# Patient Record
Sex: Male | Born: 1950
Health system: Southern US, Community
[De-identification: ages and names within clinical notes are randomized; demographics above are authoritative.]

## PROBLEM LIST (undated history)

## (undated) DIAGNOSIS — C801 Malignant (primary) neoplasm, unspecified: Secondary | ICD-10-CM

## (undated) DIAGNOSIS — H9193 Unspecified hearing loss, bilateral: Secondary | ICD-10-CM

## (undated) DIAGNOSIS — K635 Polyp of colon: Secondary | ICD-10-CM

## (undated) DIAGNOSIS — G4730 Sleep apnea, unspecified: Secondary | ICD-10-CM

## (undated) DIAGNOSIS — G43809 Other migraine, not intractable, without status migrainosus: Secondary | ICD-10-CM

## (undated) DIAGNOSIS — M1990 Unspecified osteoarthritis, unspecified site: Secondary | ICD-10-CM

## (undated) DIAGNOSIS — T7840XA Allergy, unspecified, initial encounter: Secondary | ICD-10-CM

## (undated) DIAGNOSIS — F32A Depression, unspecified: Secondary | ICD-10-CM

## (undated) DIAGNOSIS — I10 Essential (primary) hypertension: Secondary | ICD-10-CM

## (undated) DIAGNOSIS — F329 Major depressive disorder, single episode, unspecified: Secondary | ICD-10-CM

## (undated) DIAGNOSIS — G43909 Migraine, unspecified, not intractable, without status migrainosus: Secondary | ICD-10-CM

## (undated) DIAGNOSIS — R7989 Other specified abnormal findings of blood chemistry: Secondary | ICD-10-CM

## (undated) HISTORY — DX: Other specified abnormal findings of blood chemistry: R79.89

## (undated) HISTORY — PX: BICEPS TENDON REPAIR: SHX566

## (undated) HISTORY — DX: Sleep apnea, unspecified: G47.30

## (undated) HISTORY — PX: KNEE ARTHROSCOPY: SUR90

## (undated) HISTORY — DX: Polyp of colon: K63.5

## (undated) HISTORY — DX: Malignant (primary) neoplasm, unspecified: C80.1

## (undated) HISTORY — DX: Essential (primary) hypertension: I10

## (undated) HISTORY — DX: Migraine, unspecified, not intractable, without status migrainosus: G43.909

## (undated) HISTORY — DX: Other migraine, not intractable, without status migrainosus: G43.809

## (undated) HISTORY — PX: INNER EAR SURGERY: SHX679

## (undated) HISTORY — DX: Allergy, unspecified, initial encounter: T78.40XA

## (undated) HISTORY — DX: Depression, unspecified: F32.A

## (undated) HISTORY — DX: Unspecified hearing loss, bilateral: H91.93

## (undated) HISTORY — PX: WISDOM TOOTH EXTRACTION: SHX21

## (undated) HISTORY — DX: Unspecified osteoarthritis, unspecified site: M19.90

## (undated) HISTORY — PX: TYMPANOSTOMY TUBE PLACEMENT: SHX32

## (undated) HISTORY — PX: ROTATOR CUFF REPAIR: SHX139

---
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
## 2001-01-30 ENCOUNTER — Encounter: Payer: Self-pay | Admitting: Emergency Medicine

## 2001-01-30 ENCOUNTER — Emergency Department (HOSPITAL_COMMUNITY): Admission: EM | Admit: 2001-01-30 | Discharge: 2001-01-30 | Payer: Self-pay | Admitting: Emergency Medicine

## 2006-07-22 ENCOUNTER — Encounter: Payer: Self-pay | Admitting: Gastroenterology

## 2007-09-23 ENCOUNTER — Emergency Department (HOSPITAL_COMMUNITY): Admission: EM | Admit: 2007-09-23 | Discharge: 2007-09-23 | Payer: Self-pay | Admitting: Emergency Medicine

## 2008-02-26 ENCOUNTER — Ambulatory Visit: Payer: Self-pay | Admitting: Gastroenterology

## 2008-02-26 DIAGNOSIS — L290 Pruritus ani: Secondary | ICD-10-CM

## 2010-01-28 ENCOUNTER — Encounter: Admission: RE | Admit: 2010-01-28 | Discharge: 2010-01-28 | Payer: Self-pay | Admitting: Chiropractic Medicine

## 2010-12-07 ENCOUNTER — Other Ambulatory Visit: Payer: Self-pay | Admitting: Chiropractic Medicine

## 2010-12-07 DIAGNOSIS — M25512 Pain in left shoulder: Secondary | ICD-10-CM

## 2010-12-09 ENCOUNTER — Other Ambulatory Visit: Payer: Self-pay

## 2010-12-14 ENCOUNTER — Ambulatory Visit
Admission: RE | Admit: 2010-12-14 | Discharge: 2010-12-14 | Disposition: A | Discharge status: Home / Self Care | Payer: BC Managed Care – PPO | Source: Ambulatory Visit | Attending: Chiropractic Medicine | Admitting: Chiropractic Medicine

## 2010-12-14 DIAGNOSIS — M25512 Pain in left shoulder: Secondary | ICD-10-CM

## 2013-01-03 ENCOUNTER — Other Ambulatory Visit (HOSPITAL_COMMUNITY): Payer: Self-pay | Admitting: Cardiology

## 2013-01-03 ENCOUNTER — Telehealth: Payer: Self-pay | Admitting: General Surgery

## 2013-01-03 ENCOUNTER — Ambulatory Visit (HOSPITAL_COMMUNITY): Payer: BC Managed Care – PPO | Attending: Cardiology | Admitting: Radiology

## 2013-01-03 ENCOUNTER — Ambulatory Visit (HOSPITAL_BASED_OUTPATIENT_CLINIC_OR_DEPARTMENT_OTHER): Payer: BC Managed Care – PPO | Admitting: Cardiology

## 2013-01-03 ENCOUNTER — Telehealth: Payer: Self-pay | Admitting: Cardiology

## 2013-01-03 VITALS — BP 117/76 | HR 67 | Ht 71.0 in | Wt 268.0 lb

## 2013-01-03 DIAGNOSIS — R0989 Other specified symptoms and signs involving the circulatory and respiratory systems: Secondary | ICD-10-CM | POA: Insufficient documentation

## 2013-01-03 DIAGNOSIS — G809 Cerebral palsy, unspecified: Secondary | ICD-10-CM

## 2013-01-03 DIAGNOSIS — R0602 Shortness of breath: Secondary | ICD-10-CM

## 2013-01-03 DIAGNOSIS — R079 Chest pain, unspecified: Secondary | ICD-10-CM

## 2013-01-03 DIAGNOSIS — R0609 Other forms of dyspnea: Secondary | ICD-10-CM | POA: Insufficient documentation

## 2013-01-03 DIAGNOSIS — Z87891 Personal history of nicotine dependence: Secondary | ICD-10-CM | POA: Insufficient documentation

## 2013-01-03 DIAGNOSIS — I079 Rheumatic tricuspid valve disease, unspecified: Secondary | ICD-10-CM | POA: Insufficient documentation

## 2013-01-03 DIAGNOSIS — I77810 Thoracic aortic ectasia: Secondary | ICD-10-CM

## 2013-01-03 MED ORDER — TECHNETIUM TC 99M SESTAMIBI GENERIC - CARDIOLITE
30.0000 | Freq: Once | INTRAVENOUS | Status: AC | PRN
  Administered 2013-01-03: 30 via INTRAVENOUS

## 2013-01-03 MED ORDER — TECHNETIUM TC 99M SESTAMIBI GENERIC - CARDIOLITE
10.0000 | Freq: Once | INTRAVENOUS | Status: AC | PRN
  Administered 2013-01-03: 10 via INTRAVENOUS

## 2013-01-03 NOTE — Progress Notes (Signed)
LVM for pt to return call for ECHO results.

## 2013-01-03 NOTE — Progress Notes (Signed)
Dr. Mayford Knife, What DX code would you like me to put his ECHO under one year from now.

## 2013-01-03 NOTE — Progress Notes (Signed)
[facility] SITE 3 NUCLEAR MED [address] [place], [place] [address] [phone]    Cardiology Nuclear Med Study  Michael Conway is a 62 y.o. male     MRN : [id number]     DOB: [birth year]  Procedure Date: 01/03/2013  Nuclear Med Background Indication for Stress Test:  Evaluation for Ischemia History:  No prior known history of CAD, 15 yrs ago GXT: OK per patient Cardiac Risk Factors: History of Smoking  Symptoms:  DOE and SOB   Nuclear Pre-Procedure Caffeine/Decaff Intake:  None > 12 hrs NPO After: 6:00pm   Lungs:  clear O2 Sat: 98% on room air. IV 0.9% NS with Angio Cath:  20g  IV Site: R Antecubital x 1, tolerated well IV Started by:  Irean Hong, RN  Chest Size (in):  50 Cup Size: n/a  Height: 5\' 11"  (1.803 m)  Weight:  268 lb (121.564 kg)  BMI:  Body mass index is 37.39 kg/(m^2). Tech Comments:  n/a    Nuclear Med Study 1 or 2 day study: 1 day  Stress Test Type:  Stress  Reading MD: Armanda Magic, MD  Order Authorizing Provider:  Armanda Magic, MD  Resting Radionuclide: Technetium 21m Sestamibi  Resting Radionuclide Dose: 11.0 mCi   Stress Radionuclide:  Technetium 17m Sestamibi  Stress Radionuclide Dose: 33.0 mCi           Stress Protocol Rest HR: 67 Stress HR: 141  Rest BP: 117/76 Stress BP: 215/78  Exercise Time (min): 9:00 METS: 10   Predicted Max HR: 156 bpm % Max HR: 90.38 bpm Rate Pressure Product: 21308   Dose of Adenosine (mg):  n/a Dose of Lexiscan: n/a mg  Dose of Atropine (mg): n/a Dose of Dobutamine: n/a mcg/kg/min (at max HR)  Stress Test Technologist: Nelson Chimes, BS-ES  Nuclear Technologist:  Domenic Polite, CNMT     Rest Procedure:  Myocardial perfusion imaging was performed at rest 45 minutes following the intravenous administration of Technetium 63m Sestamibi. Rest ECG: Normal sinus rhythm with normal EKG  Stress Procedure:  The patient exercised on the treadmill utilizing the Bruce Protocol for 9:00 minutes. The  patient stopped due to SOB and denied any chest pain.  Technetium 70m Sestamibi was injected at peak exercise and myocardial perfusion imaging was performed after a brief delay.  Stress ECG: No significant change from baseline ECG  QPS Raw Data Images:  There is motion. The motion correction protocol was used. The rest images have a lumpy bumpy appearance. However the stress images are adequate. Stress Images:  Small area of mild decreased uptake at the apical cap. Rest Images:  The quality of the rest images is decreased. There is a lumpy bumpy appearance. Subtraction (SDS):  No evidence of ischemia. Transient Ischemic Dilatation (Normal <1.22):  n/a Lung/Heart Ratio (Normal <0.45):  0.36  Quantitative Gated Spect Images QGS EDV:  99 ml QGS ESV:  35 ml  Impression Exercise Capacity:  The patient exercised moderately well. BP Response:  There was a mild systolic hypertensive response. Clinical Symptoms:  Patient had mild shortness of breath. ECG Impression:  No significant ST segment change suggestive of ischemia. Comparison with Prior Nuclear Study: No images to compare  Overall Impression:  Normal stress nuclear study. This is a low risk scan. The stress images are good and show no marked abnormalities. The rest images are suboptimal. However this does not affect the ability to read the study. There are no significant abnormalities with the stress  images.  LV Ejection Fraction: 64%.  LV Wall Motion:  NL LV Function; NL Wall Motion.  Willa Rough , MD

## 2013-01-03 NOTE — Telephone Encounter (Signed)
Message copied by Nita Sells on Wed Jan 03, 2013  3:27 PM ------      Message from: Armanda Magic R      Created: Wed Jan 03, 2013  2:22 PM       Please let patient know that echo showed normal LVF with mildly enlarged aorta.  Please repeat echo in 1 year to make sure that aorta is not enlarging ------

## 2013-01-03 NOTE — Telephone Encounter (Signed)
Pt is aware of the results

## 2013-01-03 NOTE — Telephone Encounter (Signed)
Message copied by Nita Sells on Wed Jan 03, 2013  4:02 PM ------      Message from: Armanda Magic R      Created: Wed Jan 03, 2013  2:22 PM       Please let patient know that echo showed normal LVF with mildly enlarged aorta.  Please repeat echo in 1 year to make sure that aorta is not enlarging ------

## 2013-01-03 NOTE — Telephone Encounter (Signed)
New Problem:  Pt states he is returning Danielle's call

## 2013-01-03 NOTE — Telephone Encounter (Signed)
Message copied by Nita Sells on Wed Jan 03, 2013  4:01 PM ------      Message from: Armanda Magic R      Created: Wed Jan 03, 2013  2:22 PM       Please let patient know that echo showed normal LVF with mildly enlarged aorta.  Please repeat echo in 1 year to make sure that aorta is not enlarging ------

## 2013-01-03 NOTE — Telephone Encounter (Signed)
To Dr. Mayford Knife, What Dx Code do you want me to use for one year out ECHO?

## 2013-01-03 NOTE — Telephone Encounter (Signed)
Spoke with pt regarding ECHO results

## 2013-01-03 NOTE — Telephone Encounter (Signed)
LVM for pt to return call for ECHO results.  

## 2013-01-03 NOTE — Telephone Encounter (Signed)
Message copied by Nita Sells on Wed Jan 03, 2013  4:00 PM ------      Message from: Armanda Magic R      Created: Wed Jan 03, 2013  2:22 PM       Please let patient know that echo showed normal LVF with mildly enlarged aorta.  Please repeat echo in 1 year to make sure that aorta is not enlarging ------

## 2013-01-03 NOTE — Telephone Encounter (Signed)
Aortic root dilatation (dilated aortic root)

## 2013-01-03 NOTE — Progress Notes (Signed)
Echo performed. 

## 2013-01-04 NOTE — Addendum Note (Signed)
Addended by: Nita Sells on: 01/04/2013 08:02 AM   Modules accepted: Orders

## 2013-01-04 NOTE — Telephone Encounter (Signed)
Ordered

## 2013-01-10 ENCOUNTER — Telehealth: Payer: Self-pay | Admitting: General Surgery

## 2013-01-10 DIAGNOSIS — Z87891 Personal history of nicotine dependence: Secondary | ICD-10-CM

## 2013-01-10 NOTE — Telephone Encounter (Signed)
Pt is aware and ok with setting up referral with [facility].

## 2013-01-10 NOTE — Telephone Encounter (Signed)
Message copied by Nita Sells on Wed Jan 10, 2013  2:20 PM ------      Message from: Armanda Magic R      Created: Thu Jan 04, 2013  6:43 PM       Please let patient know that his nuclear stress test was normal with no ischemia and normal LVF.  Since his echo and stress test are fine I would like him to be set up for evaluation with [facility] given his smoking history ------

## 2014-04-11 DIAGNOSIS — R030 Elevated blood-pressure reading, without diagnosis of hypertension: Secondary | ICD-10-CM | POA: Diagnosis not present

## 2014-04-11 DIAGNOSIS — N529 Male erectile dysfunction, unspecified: Secondary | ICD-10-CM | POA: Diagnosis not present

## 2014-04-23 DIAGNOSIS — E291 Testicular hypofunction: Secondary | ICD-10-CM | POA: Diagnosis not present

## 2014-04-23 DIAGNOSIS — L0390 Cellulitis, unspecified: Secondary | ICD-10-CM | POA: Diagnosis not present

## 2014-08-19 DIAGNOSIS — L309 Dermatitis, unspecified: Secondary | ICD-10-CM | POA: Diagnosis not present

## 2014-08-27 DIAGNOSIS — N5201 Erectile dysfunction due to arterial insufficiency: Secondary | ICD-10-CM | POA: Diagnosis not present

## 2014-08-27 DIAGNOSIS — N400 Enlarged prostate without lower urinary tract symptoms: Secondary | ICD-10-CM | POA: Diagnosis not present

## 2014-08-27 DIAGNOSIS — E559 Vitamin D deficiency, unspecified: Secondary | ICD-10-CM | POA: Diagnosis not present

## 2014-08-27 DIAGNOSIS — E291 Testicular hypofunction: Secondary | ICD-10-CM | POA: Diagnosis not present

## 2014-09-17 DIAGNOSIS — L309 Dermatitis, unspecified: Secondary | ICD-10-CM | POA: Diagnosis not present

## 2014-12-23 DIAGNOSIS — H903 Sensorineural hearing loss, bilateral: Secondary | ICD-10-CM | POA: Diagnosis not present

## 2015-05-06 ENCOUNTER — Other Ambulatory Visit: Payer: Self-pay | Admitting: Family Medicine

## 2015-05-06 ENCOUNTER — Ambulatory Visit
Admission: RE | Admit: 2015-05-06 | Discharge: 2015-05-06 | Disposition: A | Discharge status: Home / Self Care | Payer: Medicare Other | Source: Ambulatory Visit | Attending: Family Medicine | Admitting: Family Medicine

## 2015-05-06 DIAGNOSIS — R05 Cough: Secondary | ICD-10-CM

## 2015-05-06 DIAGNOSIS — R059 Cough, unspecified: Secondary | ICD-10-CM

## 2015-05-06 DIAGNOSIS — R0602 Shortness of breath: Secondary | ICD-10-CM | POA: Diagnosis not present

## 2015-08-05 MED FILL — TRIAMCINOLONE 0.1% CREAM: 0.1 | 30 days supply | Qty: 60 | Fill #0

## 2016-03-31 DIAGNOSIS — M25552 Pain in left hip: Secondary | ICD-10-CM | POA: Diagnosis not present

## 2016-03-31 DIAGNOSIS — M9903 Segmental and somatic dysfunction of lumbar region: Secondary | ICD-10-CM | POA: Diagnosis not present

## 2016-03-31 DIAGNOSIS — M9904 Segmental and somatic dysfunction of sacral region: Secondary | ICD-10-CM | POA: Diagnosis not present

## 2016-03-31 DIAGNOSIS — M9905 Segmental and somatic dysfunction of pelvic region: Secondary | ICD-10-CM | POA: Diagnosis not present

## 2016-03-31 DIAGNOSIS — M791 Myalgia: Secondary | ICD-10-CM | POA: Diagnosis not present

## 2016-03-31 DIAGNOSIS — M545 Low back pain: Secondary | ICD-10-CM | POA: Diagnosis not present

## 2016-04-22 DIAGNOSIS — L308 Other specified dermatitis: Secondary | ICD-10-CM | POA: Diagnosis not present

## 2016-05-21 DIAGNOSIS — N451 Epididymitis: Secondary | ICD-10-CM | POA: Diagnosis not present

## 2016-06-01 ENCOUNTER — Ambulatory Visit (INDEPENDENT_AMBULATORY_CARE_PROVIDER_SITE_OTHER): Payer: Medicare Other | Admitting: Internal Medicine

## 2016-06-01 ENCOUNTER — Encounter: Payer: Self-pay | Admitting: Internal Medicine

## 2016-06-01 VITALS — BP 128/78 | HR 72 | Temp 98.0°F | Ht 71.0 in | Wt 277.0 lb

## 2016-06-01 DIAGNOSIS — M1990 Unspecified osteoarthritis, unspecified site: Secondary | ICD-10-CM

## 2016-06-01 DIAGNOSIS — Z1159 Encounter for screening for other viral diseases: Secondary | ICD-10-CM

## 2016-06-01 DIAGNOSIS — N400 Enlarged prostate without lower urinary tract symptoms: Secondary | ICD-10-CM

## 2016-06-01 DIAGNOSIS — E789 Disorder of lipoprotein metabolism, unspecified: Secondary | ICD-10-CM | POA: Diagnosis not present

## 2016-06-01 DIAGNOSIS — E669 Obesity, unspecified: Secondary | ICD-10-CM

## 2016-06-01 DIAGNOSIS — E349 Endocrine disorder, unspecified: Secondary | ICD-10-CM | POA: Diagnosis not present

## 2016-06-01 DIAGNOSIS — R7301 Impaired fasting glucose: Secondary | ICD-10-CM

## 2016-06-01 DIAGNOSIS — R7989 Other specified abnormal findings of blood chemistry: Secondary | ICD-10-CM

## 2016-06-01 MED ORDER — MELOXICAM 15 MG PO TABS: 15.0000 mg | ORAL_TABLET | Freq: Every day | ORAL | 3 refills | Status: DC

## 2016-06-01 NOTE — Assessment & Plan Note (Signed)
Rx for meloxicam and will monitor.

## 2016-06-01 NOTE — Progress Notes (Signed)
   Subjective:    Patient ID: Michael Conway, male    DOB: [birth year], 66 y.o.   MRN: [id number]  HPI The patient is a new 66 YO man coming in for arthritis. He was put on meloxicam for groin sprain and that helped his arthritis. He denies side effects of this. More in his hands. No trigger finger or numbness. Has tried tylenol and this did not help as well.   PMH, [facility], social history reviewed and updated.   Review of Systems  Constitutional: Negative.   HENT: Negative.   Eyes: Negative.   Respiratory: Negative for cough, chest tightness and shortness of breath.   Cardiovascular: Negative for chest pain, palpitations and leg swelling.  Gastrointestinal: Negative for abdominal distention, abdominal pain, constipation, diarrhea, nausea and vomiting.  Musculoskeletal: Positive for arthralgias. Negative for back pain, gait problem, joint swelling, myalgias and neck pain.  Skin: Negative.   Neurological: Negative.   Psychiatric/Behavioral: Negative.       Objective:   Physical Exam  Constitutional: He is oriented to person, place, and time. He appears well-developed and well-nourished.  Overweight  HENT:  Head: Normocephalic and atraumatic.  Eyes: EOM are normal.  Neck: Normal range of motion.  Cardiovascular: Normal rate and regular rhythm.   Pulmonary/Chest: Effort normal and breath sounds normal. No respiratory distress. He has no wheezes. He has no rales.  Abdominal: Soft. Bowel sounds are normal. He exhibits no distension. There is no tenderness. There is no rebound.  Musculoskeletal: He exhibits no edema.  Neurological: He is alert and oriented to person, place, and time. Coordination normal.  Skin: Skin is warm and dry.  Psychiatric: He has a normal mood and affect.   Vitals:   06/01/16 1301  BP: 128/78  Pulse: 72  Temp: 98 F (36.7 C)  TempSrc: Oral  SpO2: 98%  Weight: 277 lb (125.6 kg)  Height: 5\' 11"  (1.803 m)      Assessment & Plan:

## 2016-06-01 NOTE — Patient Instructions (Signed)
 Health Maintenance, Male A healthy lifestyle and preventive care is important for your health and wellness. Ask your health care provider about what schedule of regular examinations is right for you. What should I know about weight and diet?  Eat a Healthy Diet  Eat plenty of vegetables, fruits, whole grains, low-fat dairy products, and lean protein.  Do not eat a lot of foods high in solid fats, added sugars, or salt. Maintain a Healthy Weight  Regular exercise can help you achieve or maintain a healthy weight. You should:  Do at least 150 minutes of exercise each week. The exercise should increase your heart rate and make you sweat (moderate-intensity exercise).  Do strength-training exercises at least twice a week. Watch Your Levels of Cholesterol and Blood Lipids  Have your blood tested for lipids and cholesterol every 5 years starting at 68 years of age. If you are at high risk for heart disease, you should start having your blood tested when you are 68 years old. You may need to have your cholesterol levels checked more often if:  Your lipid or cholesterol levels are high.  You are older than 68 years of age.  You are at high risk for heart disease. What should I know about cancer screening? Many types of cancers can be detected early and may often be prevented. Lung Cancer  You should be screened every year for lung cancer if:  You are a current smoker who has smoked for at least 30 years.  You are a former smoker who has quit within the past 15 years.  Talk to your health care provider about your screening options, when you should start screening, and how often you should be screened. Colorectal Cancer  Routine colorectal cancer screening usually begins at 68 years of age and should be repeated every 5-10 years until you are 68 years old. You may need to be screened more often if early forms of precancerous polyps or small growths are found. Your health care provider  may recommend screening at an earlier age if you have risk factors for colon cancer.  Your health care provider may recommend using home test kits to check for hidden blood in the stool.  A small camera at the end of a tube can be used to examine your colon (sigmoidoscopy or colonoscopy). This checks for the earliest forms of colorectal cancer. Prostate and Testicular Cancer  Depending on your age and overall health, your health care provider may do certain tests to screen for prostate and testicular cancer.  Talk to your health care provider about any symptoms or concerns you have about testicular or prostate cancer. Skin Cancer  Check your skin from head to toe regularly.  Tell your health care provider about any new moles or changes in moles, especially if:  There is a change in a mole's size, shape, or color.  You have a mole that is larger than a pencil eraser.  Always use sunscreen. Apply sunscreen liberally and repeat throughout the day.  Protect yourself by wearing long sleeves, pants, a wide-brimmed hat, and sunglasses when outside. What should I know about heart disease, diabetes, and high blood pressure?  If you are 18-39 years of age, have your blood pressure checked every 3-5 years. If you are 40 years of age or older, have your blood pressure checked every year. You should have your blood pressure measured twice-once when you are at a hospital or clinic, and once when you are not at   a hospital or clinic. Record the average of the two measurements. To check your blood pressure when you are not at a hospital or clinic, you can use:  An automated blood pressure machine at a pharmacy.  A home blood pressure monitor.  Talk to your health care provider about your target blood pressure.  If you are between 45-79 years old, ask your health care provider if you should take aspirin to prevent heart disease.  Have regular diabetes screenings by checking your fasting blood sugar  level.  If you are at a normal weight and have a low risk for diabetes, have this test once every three years after the age of 45.  If you are overweight and have a high risk for diabetes, consider being tested at a younger age or more often.  A one-time screening for abdominal aortic aneurysm (AAA) by ultrasound is recommended for men aged 65-75 years who are current or former smokers. What should I know about preventing infection? Hepatitis B  If you have a higher risk for hepatitis B, you should be screened for this virus. Talk with your health care provider to find out if you are at risk for hepatitis B infection. Hepatitis C  Blood testing is recommended for:  Everyone born from 1945 through 1965.  Anyone with known risk factors for hepatitis C. Sexually Transmitted Diseases (STDs)  You should be screened each year for STDs including gonorrhea and chlamydia if:  You are sexually active and are younger than 68 years of age.  You are older than 68 years of age and your health care provider tells you that you are at risk for this type of infection.  Your sexual activity has changed since you were last screened and you are at an increased risk for chlamydia or gonorrhea. Ask your health care provider if you are at risk.  Talk with your health care provider about whether you are at high risk of being infected with HIV. Your health care provider may recommend a prescription medicine to help prevent HIV infection. What else can I do?  Schedule regular health, dental, and eye exams.  Stay current with your vaccines (immunizations).  Do not use any tobacco products, such as cigarettes, chewing tobacco, and e-cigarettes. If you need help quitting, ask your health care provider.  Limit alcohol intake to no more than 2 drinks per day. One drink equals 12 ounces of beer, 5 ounces of wine, or 1 ounces of hard liquor.  Do not use street drugs.  Do not share needles.  Ask your health  care provider for help if you need support or information about quitting drugs.  Tell your health care provider if you often feel depressed.  Tell your health care provider if you have ever been abused or do not feel safe at home. This information is not intended to replace advice given to you by your health care provider. Make sure you discuss any questions you have with your health care provider. Document Released: 09/11/2007 Document Revised: 11/12/2015 Document Reviewed: 12/17/2014 Elsevier Interactive Patient Education  2017 [employer]vier Inc.  

## 2016-06-01 NOTE — Progress Notes (Signed)
Pre visit review using our clinic review tool, if applicable. No additional management support is needed unless otherwise documented below in the visit note. 

## 2016-06-01 NOTE — Assessment & Plan Note (Signed)
No complications and he is working to lose about 25 pounds right now.

## 2016-06-02 ENCOUNTER — Other Ambulatory Visit (INDEPENDENT_AMBULATORY_CARE_PROVIDER_SITE_OTHER): Payer: Medicare Other

## 2016-06-02 DIAGNOSIS — N400 Enlarged prostate without lower urinary tract symptoms: Secondary | ICD-10-CM | POA: Diagnosis not present

## 2016-06-02 DIAGNOSIS — Z1159 Encounter for screening for other viral diseases: Secondary | ICD-10-CM | POA: Diagnosis not present

## 2016-06-02 DIAGNOSIS — E789 Disorder of lipoprotein metabolism, unspecified: Secondary | ICD-10-CM | POA: Diagnosis not present

## 2016-06-02 DIAGNOSIS — R7989 Other specified abnormal findings of blood chemistry: Secondary | ICD-10-CM

## 2016-06-02 DIAGNOSIS — E349 Endocrine disorder, unspecified: Secondary | ICD-10-CM | POA: Diagnosis not present

## 2016-06-02 DIAGNOSIS — R7301 Impaired fasting glucose: Secondary | ICD-10-CM | POA: Diagnosis not present

## 2016-06-02 LAB — LIPID PANEL
Cholesterol: 184 mg/dL (ref 0–200)
HDL: 42.8 mg/dL (ref >39.00)
LDL Cholesterol: 105 mg/dL — ABNORMAL HIGH (ref 0–99)
NonHDL: 141.6
Total CHOL/HDL Ratio: 4
Triglycerides: 183 mg/dL — ABNORMAL HIGH (ref 0.0–149.0)
VLDL: 36.6 mg/dL (ref 0.0–40.0)

## 2016-06-02 LAB — COMPREHENSIVE METABOLIC PANEL
ALT: 22 U/L (ref 0–53)
AST: 23 U/L (ref 0–37)
Albumin: 4.4 g/dL (ref 3.5–5.2)
Alkaline Phosphatase: 72 U/L (ref 39–117)
BUN: 22 mg/dL (ref 6–23)
CO2: 26 mEq/L (ref 19–32)
Calcium: 9.6 mg/dL (ref 8.4–10.5)
Chloride: 105 mEq/L (ref 96–112)
Creatinine, Ser: 1.18 mg/dL (ref 0.40–1.50)
GFR: 65.3 mL/min (ref >60.00)
Glucose, Bld: 99 mg/dL (ref 70–99)
Potassium: 4.6 mEq/L (ref 3.5–5.1)
Sodium: 139 mEq/L (ref 135–145)
Total Bilirubin: 0.6 mg/dL (ref 0.2–1.2)
Total Protein: 7.1 g/dL (ref 6.0–8.3)

## 2016-06-02 LAB — CBC
HCT: 45.6 % (ref 39.0–52.0)
Hemoglobin: 15.7 g/dL (ref 13.0–17.0)
MCHC: 34.4 g/dL (ref 30.0–36.0)
MCV: 85.1 fl (ref 78.0–100.0)
Platelets: 234 10*3/uL (ref 150.0–400.0)
RBC: 5.35 Mil/uL (ref 4.22–5.81)
RDW: 13.8 % (ref 11.5–15.5)
WBC: 7.9 10*3/uL (ref 4.0–10.5)

## 2016-06-02 LAB — HEMOGLOBIN A1C: Hgb A1c MFr Bld: 5.5 % (ref 4.6–6.5)

## 2016-06-02 LAB — PSA: PSA: 1.4 ng/mL (ref 0.10–4.00)

## 2016-06-03 LAB — HEPATITIS C ANTIBODY: HCV AB: NEGATIVE

## 2016-06-04 LAB — TESTOSTERONE, FREE, TOTAL, SHBG
SEX HORMONE BINDING: 48.5 nmol/L (ref 19.3–76.4)
TESTOSTERONE: 285 ng/dL (ref 264–916)
Testosterone, Free: 7.6 pg/mL (ref 6.6–18.1)

## 2016-06-17 ENCOUNTER — Encounter: Payer: Self-pay | Admitting: Internal Medicine

## 2016-06-17 ENCOUNTER — Ambulatory Visit (INDEPENDENT_AMBULATORY_CARE_PROVIDER_SITE_OTHER): Payer: Medicare Other | Admitting: Internal Medicine

## 2016-06-17 DIAGNOSIS — E291 Testicular hypofunction: Secondary | ICD-10-CM

## 2016-06-17 DIAGNOSIS — R7989 Other specified abnormal findings of blood chemistry: Secondary | ICD-10-CM | POA: Insufficient documentation

## 2016-06-17 MED ORDER — CLOMIPHENE CITRATE 50 MG PO TABS: 50.0000 mg | ORAL_TABLET | Freq: Every day | ORAL | 6 refills | Status: DC

## 2016-06-17 NOTE — Progress Notes (Signed)
Pre visit review using our clinic review tool, if applicable. No additional management support is needed unless otherwise documented below in the visit note. 

## 2016-06-17 NOTE — Assessment & Plan Note (Signed)
Rx for clomid daily to see if this normalizes, probably partially exacerbated by obesity. Return for labs in 4-6 weeks.

## 2016-06-17 NOTE — Progress Notes (Signed)
   Subjective:    Patient ID: Michael Conway, male    DOB: 08-Jul-1950, 66 y.o.   MRN: [id number]  HPI The patient is a 66 YO man coming in for follow up of his low testosterone levels. Having some fatigue and low sex drive. Has been on testosterone rods in the past which were helpful. They are not currently covered by his insurance. Was never tried on other treatment in the past. Has never done steroids for bodybuilding or other testosterone illicit.   Review of Systems  Constitutional: Positive for fatigue. Negative for activity change, appetite change, fever and unexpected weight change.  Respiratory: Negative.   Cardiovascular: Negative.   Gastrointestinal: Negative.   Musculoskeletal: Negative.   Neurological: Negative.       Objective:   Physical Exam  Constitutional: He is oriented to person, place, and time. He appears well-developed and well-nourished.  HENT:  Head: Normocephalic and atraumatic.  Eyes: EOM are normal.  Neck: Normal range of motion.  Cardiovascular: Normal rate and regular rhythm.   Pulmonary/Chest: Effort normal. No respiratory distress. He has no wheezes. He has no rales.  Abdominal: Soft.  Neurological: He is alert and oriented to person, place, and time. Coordination normal.  Skin: Skin is warm and dry.   Vitals:   06/17/16 1302  BP: (!) 150/86  Pulse: 81  Resp: 14  Temp: 98.2 F (36.8 C)  TempSrc: Oral  SpO2: 98%  Weight: 278 lb (126.1 kg)  Height: 5\' 11"  (1.803 m)      Assessment & Plan:

## 2016-06-17 NOTE — Patient Instructions (Signed)
We have sent in the clomid for the testosterone to help. This is 1 pill daily. Come back for labs in about 5 weeks.

## 2016-07-22 ENCOUNTER — Encounter: Payer: Self-pay | Admitting: Internal Medicine

## 2016-07-22 ENCOUNTER — Ambulatory Visit (INDEPENDENT_AMBULATORY_CARE_PROVIDER_SITE_OTHER): Payer: Medicare Other | Admitting: Internal Medicine

## 2016-07-22 DIAGNOSIS — J40 Bronchitis, not specified as acute or chronic: Secondary | ICD-10-CM | POA: Diagnosis not present

## 2016-07-22 MED ORDER — CLOMIPHENE CITRATE 50 MG PO TABS: 50.0000 mg | ORAL_TABLET | Freq: Every day | ORAL | 6 refills | Status: DC

## 2016-07-22 MED ORDER — DOXYCYCLINE HYCLATE 100 MG PO TABS: 100.0000 mg | ORAL_TABLET | Freq: Two times a day (BID) | ORAL | 0 refills | Status: DC

## 2016-07-22 MED ORDER — PREDNISONE 20 MG PO TABS: 40.0000 mg | ORAL_TABLET | Freq: Every day | ORAL | 0 refills | Status: DC

## 2016-07-22 MED FILL — predniSONE 20 MG TABS: 20 | 5 days supply | Qty: 10 | Fill #0

## 2016-07-22 MED FILL — DOXYCYCLINE HYCLATE 100 MG: 100 | 7 days supply | Qty: 14 | Fill #0

## 2016-07-22 MED FILL — CLOMIPHENE CITRATE 50 MG TA: 50 | 30 days supply | Qty: 30 | Fill #0

## 2016-07-22 NOTE — Assessment & Plan Note (Signed)
Some scattered rhonchi on lung exam. Rx for prednisone and doxycycline.

## 2016-07-22 NOTE — Patient Instructions (Signed)
We have sent in the prednisone to help dry you up quicker. Take 2 pills daily for 5 days.  We have sent in doxycycline for the sinuses. Take 1 pill twice a day for 1 week.

## 2016-07-22 NOTE — Progress Notes (Signed)
   Subjective:    Patient ID: Michael Conway, male    DOB: [birth year], 66 y.o.   MRN: [id number]  HPI The patient is a 66 YO man coming in for cough and congestion lasting about 3 weeks. Some fevers and chills intermittent. SOB with exertion and coughing up green to yellow sputum whole time. Some nasal drainage of same color the whole time. Denies headaches or sinus pressure. Overall is worsening rather than improving. Has tried otc allergy medicine and dayquil which were not helpful. Has taken 3 days off this week due to fatigue, cough, breathing.   Review of Systems  Constitutional: Positive for activity change, appetite change, chills, fatigue and fever. Negative for unexpected weight change.  HENT: Positive for congestion, postnasal drip, rhinorrhea and sore throat. Negative for ear discharge, ear pain, sinus pain, sinus pressure, trouble swallowing and voice change.   Eyes: Negative.   Respiratory: Positive for cough and shortness of breath. Negative for chest tightness and wheezing.   Cardiovascular: Negative.   Gastrointestinal: Negative.   Musculoskeletal: Negative.   Neurological: Negative.       Objective:   Physical Exam  Constitutional: He appears well-developed and well-nourished.  HENT:  Head: Normocephalic and atraumatic.  TMs normal, oropharynx with redness and drainage, nose with crusting, no sinus pressure.   Eyes: EOM are normal.  Neck: Normal range of motion. No JVD present.  Cardiovascular: Normal rate and regular rhythm.   Pulmonary/Chest: Effort normal.  Scattered rhonchi on exam which do not clear with coughing.   Abdominal: Soft.  Lymphadenopathy:    He has no cervical adenopathy.   Vitals:   07/22/16 0944  BP: (!) 142/68  Pulse: 86  Resp: 12  Temp: 98.7 F (37.1 C)  TempSrc: Oral  SpO2: 98%  Weight: 279 lb (126.6 kg)  Height: 5\' 11"  (1.803 m)      Assessment & Plan:

## 2016-07-22 NOTE — Progress Notes (Signed)
Pre visit review using our clinic review tool, if applicable. No additional management support is needed unless otherwise documented below in the visit note. 

## 2016-07-24 DIAGNOSIS — G8929 Other chronic pain: Secondary | ICD-10-CM | POA: Diagnosis not present

## 2016-07-24 DIAGNOSIS — M25511 Pain in right shoulder: Secondary | ICD-10-CM | POA: Diagnosis not present

## 2016-08-04 DIAGNOSIS — M25511 Pain in right shoulder: Secondary | ICD-10-CM | POA: Diagnosis not present

## 2016-08-04 DIAGNOSIS — G8929 Other chronic pain: Secondary | ICD-10-CM | POA: Diagnosis not present

## 2016-08-16 DIAGNOSIS — G8929 Other chronic pain: Secondary | ICD-10-CM | POA: Diagnosis not present

## 2016-08-16 DIAGNOSIS — M25511 Pain in right shoulder: Secondary | ICD-10-CM | POA: Diagnosis not present

## 2016-12-07 ENCOUNTER — Telehealth: Payer: Self-pay | Admitting: Internal Medicine

## 2016-12-07 DIAGNOSIS — H918X9 Other specified hearing loss, unspecified ear: Secondary | ICD-10-CM

## 2016-12-07 NOTE — Telephone Encounter (Signed)
Placed referral  

## 2016-12-07 NOTE — Telephone Encounter (Signed)
Pt requesting a referral to Dr. Valda Favia at [facility] Can you please put one in for pt.

## 2016-12-17 DIAGNOSIS — H903 Sensorineural hearing loss, bilateral: Secondary | ICD-10-CM | POA: Diagnosis not present

## 2017-02-28 ENCOUNTER — Ambulatory Visit: Payer: Medicare Other | Admitting: Internal Medicine

## 2017-03-16 DIAGNOSIS — M25562 Pain in left knee: Secondary | ICD-10-CM | POA: Diagnosis not present

## 2017-03-16 DIAGNOSIS — M238X2 Other internal derangements of left knee: Secondary | ICD-10-CM | POA: Diagnosis not present

## 2017-04-12 DIAGNOSIS — M25562 Pain in left knee: Secondary | ICD-10-CM | POA: Insufficient documentation

## 2017-04-18 DIAGNOSIS — M25562 Pain in left knee: Secondary | ICD-10-CM | POA: Diagnosis not present

## 2017-05-02 DIAGNOSIS — M1712 Unilateral primary osteoarthritis, left knee: Secondary | ICD-10-CM | POA: Diagnosis not present

## 2017-05-02 DIAGNOSIS — S83242A Other tear of medial meniscus, current injury, left knee, initial encounter: Secondary | ICD-10-CM | POA: Diagnosis not present

## 2017-05-12 DIAGNOSIS — M94262 Chondromalacia, left knee: Secondary | ICD-10-CM | POA: Diagnosis not present

## 2017-05-12 DIAGNOSIS — Y999 Unspecified external cause status: Secondary | ICD-10-CM | POA: Diagnosis not present

## 2017-05-12 DIAGNOSIS — S83232A Complex tear of medial meniscus, current injury, left knee, initial encounter: Secondary | ICD-10-CM | POA: Diagnosis not present

## 2017-05-12 DIAGNOSIS — G8918 Other acute postprocedural pain: Secondary | ICD-10-CM | POA: Diagnosis not present

## 2017-05-12 DIAGNOSIS — M23332 Other meniscus derangements, other medial meniscus, left knee: Secondary | ICD-10-CM | POA: Diagnosis not present

## 2017-05-12 DIAGNOSIS — X58XXXA Exposure to other specified factors, initial encounter: Secondary | ICD-10-CM | POA: Diagnosis not present

## 2017-05-19 DIAGNOSIS — M25562 Pain in left knee: Secondary | ICD-10-CM | POA: Diagnosis not present

## 2017-06-03 DIAGNOSIS — M84362A Stress fracture, left tibia, initial encounter for fracture: Secondary | ICD-10-CM | POA: Diagnosis not present

## 2017-06-03 DIAGNOSIS — Z4789 Encounter for other orthopedic aftercare: Secondary | ICD-10-CM | POA: Diagnosis not present

## 2017-06-08 DIAGNOSIS — D225 Melanocytic nevi of trunk: Secondary | ICD-10-CM | POA: Diagnosis not present

## 2017-06-08 DIAGNOSIS — L820 Inflamed seborrheic keratosis: Secondary | ICD-10-CM | POA: Diagnosis not present

## 2017-06-08 DIAGNOSIS — D485 Neoplasm of uncertain behavior of skin: Secondary | ICD-10-CM | POA: Diagnosis not present

## 2017-06-08 DIAGNOSIS — I8312 Varicose veins of left lower extremity with inflammation: Secondary | ICD-10-CM | POA: Diagnosis not present

## 2017-06-08 DIAGNOSIS — L821 Other seborrheic keratosis: Secondary | ICD-10-CM | POA: Diagnosis not present

## 2017-06-20 DIAGNOSIS — M84362A Stress fracture, left tibia, initial encounter for fracture: Secondary | ICD-10-CM | POA: Diagnosis not present

## 2017-07-08 DIAGNOSIS — R350 Frequency of micturition: Secondary | ICD-10-CM | POA: Diagnosis not present

## 2017-07-08 DIAGNOSIS — R3911 Hesitancy of micturition: Secondary | ICD-10-CM | POA: Diagnosis not present

## 2017-07-08 DIAGNOSIS — N5201 Erectile dysfunction due to arterial insufficiency: Secondary | ICD-10-CM | POA: Diagnosis not present

## 2017-07-08 DIAGNOSIS — N401 Enlarged prostate with lower urinary tract symptoms: Secondary | ICD-10-CM | POA: Diagnosis not present

## 2017-07-08 DIAGNOSIS — Z125 Encounter for screening for malignant neoplasm of prostate: Secondary | ICD-10-CM | POA: Diagnosis not present

## 2017-07-08 DIAGNOSIS — R3915 Urgency of urination: Secondary | ICD-10-CM | POA: Diagnosis not present

## 2017-07-14 DIAGNOSIS — N401 Enlarged prostate with lower urinary tract symptoms: Secondary | ICD-10-CM | POA: Diagnosis not present

## 2017-07-18 DIAGNOSIS — M1712 Unilateral primary osteoarthritis, left knee: Secondary | ICD-10-CM | POA: Diagnosis not present

## 2017-07-18 DIAGNOSIS — Z4789 Encounter for other orthopedic aftercare: Secondary | ICD-10-CM | POA: Diagnosis not present

## 2017-08-12 DIAGNOSIS — N401 Enlarged prostate with lower urinary tract symptoms: Secondary | ICD-10-CM | POA: Diagnosis not present

## 2017-08-19 DIAGNOSIS — E291 Testicular hypofunction: Secondary | ICD-10-CM | POA: Diagnosis not present

## 2017-08-19 DIAGNOSIS — N401 Enlarged prostate with lower urinary tract symptoms: Secondary | ICD-10-CM | POA: Diagnosis not present

## 2017-08-19 DIAGNOSIS — R350 Frequency of micturition: Secondary | ICD-10-CM | POA: Diagnosis not present

## 2017-08-19 DIAGNOSIS — N5201 Erectile dysfunction due to arterial insufficiency: Secondary | ICD-10-CM | POA: Diagnosis not present

## 2017-08-25 ENCOUNTER — Telehealth: Payer: Self-pay | Admitting: Emergency Medicine

## 2017-08-25 NOTE — Telephone Encounter (Signed)
Called patient to schedule AWV. Patient will call back at later date to schedule. 

## 2017-10-05 DIAGNOSIS — H833X3 Noise effects on inner ear, bilateral: Secondary | ICD-10-CM | POA: Diagnosis not present

## 2017-10-05 DIAGNOSIS — H903 Sensorineural hearing loss, bilateral: Secondary | ICD-10-CM | POA: Diagnosis not present

## 2017-10-05 DIAGNOSIS — H9313 Tinnitus, bilateral: Secondary | ICD-10-CM | POA: Diagnosis not present

## 2017-10-06 DIAGNOSIS — H9313 Tinnitus, bilateral: Secondary | ICD-10-CM | POA: Insufficient documentation

## 2017-10-06 DIAGNOSIS — H833X3 Noise effects on inner ear, bilateral: Secondary | ICD-10-CM | POA: Insufficient documentation

## 2017-10-11 DIAGNOSIS — H833X1 Noise effects on right inner ear: Secondary | ICD-10-CM | POA: Diagnosis not present

## 2017-10-11 DIAGNOSIS — H833X2 Noise effects on left inner ear: Secondary | ICD-10-CM | POA: Diagnosis not present

## 2017-10-11 DIAGNOSIS — H903 Sensorineural hearing loss, bilateral: Secondary | ICD-10-CM | POA: Diagnosis not present

## 2017-10-11 MED FILL — predniSONE 20 MG TABS: 20 | 13 days supply | Qty: 28 | Fill #0

## 2017-10-24 DIAGNOSIS — H833X1 Noise effects on right inner ear: Secondary | ICD-10-CM | POA: Diagnosis not present

## 2017-10-24 DIAGNOSIS — H833X2 Noise effects on left inner ear: Secondary | ICD-10-CM | POA: Diagnosis not present

## 2017-10-24 DIAGNOSIS — H903 Sensorineural hearing loss, bilateral: Secondary | ICD-10-CM | POA: Diagnosis not present

## 2017-11-02 DIAGNOSIS — H833X1 Noise effects on right inner ear: Secondary | ICD-10-CM | POA: Diagnosis not present

## 2017-11-02 DIAGNOSIS — H833X2 Noise effects on left inner ear: Secondary | ICD-10-CM | POA: Diagnosis not present

## 2017-11-02 DIAGNOSIS — H903 Sensorineural hearing loss, bilateral: Secondary | ICD-10-CM | POA: Diagnosis not present

## 2017-11-02 DIAGNOSIS — H9041 Sensorineural hearing loss, unilateral, right ear, with unrestricted hearing on the contralateral side: Secondary | ICD-10-CM | POA: Diagnosis not present

## 2017-11-02 MED FILL — CIPRODEX OTIC SUSPENSION: 0.3-0.1 | 7 days supply | Qty: 8 | Fill #0

## 2017-11-02 MED FILL — CEFPROZIL 500 MG TABLET: 500 | 10 days supply | Qty: 20 | Fill #0

## 2017-11-10 DIAGNOSIS — H833X2 Noise effects on left inner ear: Secondary | ICD-10-CM | POA: Diagnosis not present

## 2017-11-10 DIAGNOSIS — H833X1 Noise effects on right inner ear: Secondary | ICD-10-CM | POA: Diagnosis not present

## 2017-11-10 DIAGNOSIS — H903 Sensorineural hearing loss, bilateral: Secondary | ICD-10-CM | POA: Diagnosis not present

## 2017-11-16 DIAGNOSIS — H833X2 Noise effects on left inner ear: Secondary | ICD-10-CM | POA: Diagnosis not present

## 2017-11-16 DIAGNOSIS — H903 Sensorineural hearing loss, bilateral: Secondary | ICD-10-CM | POA: Diagnosis not present

## 2017-11-16 DIAGNOSIS — H833X1 Noise effects on right inner ear: Secondary | ICD-10-CM | POA: Diagnosis not present

## 2017-11-16 MED FILL — TRIAMTERENE/HCTZ 37.5/25 CP: 37.5-25 | 30 days supply | Qty: 30 | Fill #0

## 2017-11-16 MED FILL — predniSONE 20 MG TABS: 20 | 12 days supply | Qty: 28 | Fill #0

## 2017-11-24 DIAGNOSIS — H903 Sensorineural hearing loss, bilateral: Secondary | ICD-10-CM | POA: Diagnosis not present

## 2017-11-24 DIAGNOSIS — H833X1 Noise effects on right inner ear: Secondary | ICD-10-CM | POA: Diagnosis not present

## 2017-11-24 DIAGNOSIS — H833X2 Noise effects on left inner ear: Secondary | ICD-10-CM | POA: Diagnosis not present

## 2017-11-29 DIAGNOSIS — H833X1 Noise effects on right inner ear: Secondary | ICD-10-CM | POA: Diagnosis not present

## 2017-11-29 DIAGNOSIS — G43819 Other migraine, intractable, without status migrainosus: Secondary | ICD-10-CM | POA: Diagnosis not present

## 2017-11-29 DIAGNOSIS — H833X2 Noise effects on left inner ear: Secondary | ICD-10-CM | POA: Diagnosis not present

## 2017-11-29 DIAGNOSIS — H903 Sensorineural hearing loss, bilateral: Secondary | ICD-10-CM | POA: Diagnosis not present

## 2017-12-01 DIAGNOSIS — R42 Dizziness and giddiness: Secondary | ICD-10-CM | POA: Insufficient documentation

## 2017-12-01 DIAGNOSIS — G43809 Other migraine, not intractable, without status migrainosus: Secondary | ICD-10-CM | POA: Insufficient documentation

## 2017-12-01 DIAGNOSIS — H903 Sensorineural hearing loss, bilateral: Secondary | ICD-10-CM | POA: Diagnosis not present

## 2017-12-01 DIAGNOSIS — G43109 Migraine with aura, not intractable, without status migrainosus: Secondary | ICD-10-CM | POA: Diagnosis not present

## 2017-12-17 DIAGNOSIS — G43109 Migraine with aura, not intractable, without status migrainosus: Secondary | ICD-10-CM | POA: Diagnosis not present

## 2017-12-17 DIAGNOSIS — H903 Sensorineural hearing loss, bilateral: Secondary | ICD-10-CM | POA: Diagnosis not present

## 2017-12-17 DIAGNOSIS — R42 Dizziness and giddiness: Secondary | ICD-10-CM | POA: Diagnosis not present

## 2018-01-19 DIAGNOSIS — H833X1 Noise effects on right inner ear: Secondary | ICD-10-CM | POA: Diagnosis not present

## 2018-01-19 DIAGNOSIS — H7201 Central perforation of tympanic membrane, right ear: Secondary | ICD-10-CM | POA: Diagnosis not present

## 2018-01-19 DIAGNOSIS — H903 Sensorineural hearing loss, bilateral: Secondary | ICD-10-CM | POA: Diagnosis not present

## 2018-01-19 DIAGNOSIS — H833X2 Noise effects on left inner ear: Secondary | ICD-10-CM | POA: Diagnosis not present

## 2018-01-19 DIAGNOSIS — G43819 Other migraine, intractable, without status migrainosus: Secondary | ICD-10-CM | POA: Diagnosis not present

## 2018-02-02 DIAGNOSIS — L820 Inflamed seborrheic keratosis: Secondary | ICD-10-CM | POA: Diagnosis not present

## 2018-02-02 DIAGNOSIS — D225 Melanocytic nevi of trunk: Secondary | ICD-10-CM | POA: Diagnosis not present

## 2018-02-02 DIAGNOSIS — L821 Other seborrheic keratosis: Secondary | ICD-10-CM | POA: Diagnosis not present

## 2018-02-06 DIAGNOSIS — G43711 Chronic migraine without aura, intractable, with status migrainosus: Secondary | ICD-10-CM | POA: Insufficient documentation

## 2018-02-06 DIAGNOSIS — G43109 Migraine with aura, not intractable, without status migrainosus: Secondary | ICD-10-CM | POA: Diagnosis not present

## 2018-02-09 DIAGNOSIS — H7201 Central perforation of tympanic membrane, right ear: Secondary | ICD-10-CM | POA: Diagnosis not present

## 2018-02-09 DIAGNOSIS — H903 Sensorineural hearing loss, bilateral: Secondary | ICD-10-CM | POA: Diagnosis not present

## 2018-02-09 DIAGNOSIS — H833X2 Noise effects on left inner ear: Secondary | ICD-10-CM | POA: Diagnosis not present

## 2018-02-09 DIAGNOSIS — H918X3 Other specified hearing loss, bilateral: Secondary | ICD-10-CM | POA: Diagnosis not present

## 2018-02-09 DIAGNOSIS — G43819 Other migraine, intractable, without status migrainosus: Secondary | ICD-10-CM | POA: Diagnosis not present

## 2018-02-09 DIAGNOSIS — H833X1 Noise effects on right inner ear: Secondary | ICD-10-CM | POA: Diagnosis not present

## 2018-02-09 DIAGNOSIS — H8391 Unspecified disease of right inner ear: Secondary | ICD-10-CM | POA: Diagnosis not present

## 2018-02-09 DIAGNOSIS — H9123 Sudden idiopathic hearing loss, bilateral: Secondary | ICD-10-CM | POA: Diagnosis not present

## 2018-02-15 DIAGNOSIS — H833X2 Noise effects on left inner ear: Secondary | ICD-10-CM | POA: Diagnosis not present

## 2018-02-15 DIAGNOSIS — H918X3 Other specified hearing loss, bilateral: Secondary | ICD-10-CM | POA: Diagnosis not present

## 2018-02-15 DIAGNOSIS — H9123 Sudden idiopathic hearing loss, bilateral: Secondary | ICD-10-CM | POA: Diagnosis not present

## 2018-02-15 DIAGNOSIS — H8391 Unspecified disease of right inner ear: Secondary | ICD-10-CM | POA: Diagnosis not present

## 2018-02-15 DIAGNOSIS — H7201 Central perforation of tympanic membrane, right ear: Secondary | ICD-10-CM | POA: Diagnosis not present

## 2018-02-15 DIAGNOSIS — E039 Hypothyroidism, unspecified: Secondary | ICD-10-CM | POA: Diagnosis not present

## 2018-02-15 DIAGNOSIS — M2550 Pain in unspecified joint: Secondary | ICD-10-CM | POA: Diagnosis not present

## 2018-02-15 DIAGNOSIS — Z6841 Body Mass Index (BMI) 40.0 and over, adult: Secondary | ICD-10-CM | POA: Diagnosis not present

## 2018-02-15 DIAGNOSIS — H903 Sensorineural hearing loss, bilateral: Secondary | ICD-10-CM | POA: Diagnosis not present

## 2018-02-15 DIAGNOSIS — H838X3 Other specified diseases of inner ear, bilateral: Secondary | ICD-10-CM | POA: Diagnosis not present

## 2018-02-15 DIAGNOSIS — G43819 Other migraine, intractable, without status migrainosus: Secondary | ICD-10-CM | POA: Diagnosis not present

## 2018-02-15 MED FILL — FOLIC ACID 1 MG TABS: 1 | 30 days supply | Qty: 30 | Fill #0

## 2018-02-15 MED FILL — METHOTREXATE SODIUM 2.5 MG: 2.5 | 28 days supply | Qty: 24 | Fill #0

## 2018-02-15 MED FILL — predniSONE 10 MG TABS: 10 | 30 days supply | Qty: 120 | Fill #0

## 2018-03-08 ENCOUNTER — Encounter: Payer: Self-pay | Admitting: Neurology

## 2018-03-10 DIAGNOSIS — H9123 Sudden idiopathic hearing loss, bilateral: Secondary | ICD-10-CM | POA: Diagnosis not present

## 2018-03-10 DIAGNOSIS — G43819 Other migraine, intractable, without status migrainosus: Secondary | ICD-10-CM | POA: Diagnosis not present

## 2018-03-10 DIAGNOSIS — H903 Sensorineural hearing loss, bilateral: Secondary | ICD-10-CM | POA: Diagnosis not present

## 2018-03-10 DIAGNOSIS — H833X2 Noise effects on left inner ear: Secondary | ICD-10-CM | POA: Diagnosis not present

## 2018-03-14 MED FILL — predniSONE 10 MG TABS: 10 | 30 days supply | Qty: 120 | Fill #1

## 2018-03-14 MED FILL — TRIAMTERENE/HCTZ 37.5/25 CP: 37.5-25 | 30 days supply | Qty: 30 | Fill #1

## 2018-03-17 MED FILL — METHOTREXATE SODIUM 2.5 MG: 2.5 | 28 days supply | Qty: 24 | Fill #1

## 2018-04-20 DIAGNOSIS — H8391 Unspecified disease of right inner ear: Secondary | ICD-10-CM | POA: Diagnosis not present

## 2018-04-20 DIAGNOSIS — G43819 Other migraine, intractable, without status migrainosus: Secondary | ICD-10-CM | POA: Diagnosis not present

## 2018-04-20 DIAGNOSIS — H918X3 Other specified hearing loss, bilateral: Secondary | ICD-10-CM | POA: Diagnosis not present

## 2018-04-20 DIAGNOSIS — H9123 Sudden idiopathic hearing loss, bilateral: Secondary | ICD-10-CM | POA: Diagnosis not present

## 2018-04-20 DIAGNOSIS — H903 Sensorineural hearing loss, bilateral: Secondary | ICD-10-CM | POA: Diagnosis not present

## 2018-04-20 DIAGNOSIS — H833X2 Noise effects on left inner ear: Secondary | ICD-10-CM | POA: Diagnosis not present

## 2018-04-20 MED FILL — METHOTREXATE SODIUM 2.5 MG: 2.5 | 28 days supply | Qty: 24 | Fill #2

## 2018-04-21 DIAGNOSIS — H918X3 Other specified hearing loss, bilateral: Secondary | ICD-10-CM | POA: Diagnosis not present

## 2018-04-21 DIAGNOSIS — H903 Sensorineural hearing loss, bilateral: Secondary | ICD-10-CM | POA: Diagnosis not present

## 2018-04-26 DIAGNOSIS — H838X3 Other specified diseases of inner ear, bilateral: Secondary | ICD-10-CM | POA: Diagnosis not present

## 2018-04-26 DIAGNOSIS — Z6841 Body Mass Index (BMI) 40.0 and over, adult: Secondary | ICD-10-CM | POA: Diagnosis not present

## 2018-05-11 NOTE — Progress Notes (Signed)
NEUROLOGY CONSULTATION NOTE  PASTOR SGRO MRN: [id number] DOB: 1950/05/18  Referring provider: Vicie Mutters, MD Primary care provider: Pricilla Holm, MD  Reason for consult:  migraines  HISTORY OF PRESENT ILLNESS: Michael Conway is a 68 year old right-handed Caucasian man who presents for probable migraines.  He is accompanied by his friend who supplements history.  History supplemented by referring providers notes.  Since the 1980s. he has migraines and progressive bilateral sensorineural hearing loss with tinnitus of probable autoimmune etiology.  Symptoms worse after exposure to loud explosions [place] a [place] in July 2019.  Repeated audiometric testing has demonstrated progressive high frequency sensorineural hearing loss with continued loss of discrimination in both ears.  Autoimmune workup, including ANA, Sed Rate, RF, complement protein levels, HLA-B27, anitphospholipid antibodies, 68 Kd antibody titers, type II collagen antibodies, hepatitis B and C titers and RPR, were unremarkable.  Methotrexate, high-dose oral steroids and intratympanic dexamethasone were ineffective.  He was evaluated at [facility] where MRI of the brain and internal auditory canals without contrast was performed on 12/17/17 which was unremarkable, including CN VII and VIII and cerebellopontine angle.  He was subsequently diagnosed with vestibular migraines.  He reports daily headaches (at least a constant 2/10), tinnitus, and vertigo.  Headaches are mild to severe posterior neck stiffness to occipital pressure but when more severe become right frontal and sometimes bilateral pounding. He is able to still function during the migraines.  Severe headaches last a few hours.  Severe headaches occurred 46 days since September.    He has treats headaches with Advil, Aleve, BCs.  He takes an analgesic once every 2 weeks.  He also notes tingling in the anterior thighs occurring once a week, lasting maybe an hour.   Denies back pain, radicular pain or leg weakness.     PAST MEDICAL HISTORY: Autoimmune hearing loss  PAST SURGICAL HISTORY: Knee arthroscopy Bilateral rotator cuff repair  MEDICATIONS: Prednisone 7m daily Methotrexate 2.541m6 tabs weekly MVI daily Magnesium 22144RXaily Folic acid 105400QQaily Zinc 5058maily D3 50,000 IU weekly  ALLERGIES: Peppermint No known drug allergies  FAMILY HISTORY: Family History  Problem Relation Age of Onset  . Cancer Other     SOCIAL HISTORY: Marital status:  Single Smoking:  Non-smoker/former-smoker Alcohol:  Occasionally Caffeine:  3 cups of coffee daily  REVIEW OF SYSTEMS: Constitutional: No fevers, chills, or sweats, no generalized fatigue, change in appetite Eyes: No visual changes, double vision, eye pain Ear, nose and throat: hearing loss Cardiovascular: No chest pain, palpitations Respiratory:  No shortness of breath at rest or with exertion, wheezes GastrointestinaI: No nausea, vomiting, diarrhea, abdominal pain, fecal incontinence Genitourinary:  No dysuria, urinary retention or frequency Musculoskeletal:  No neck pain, back pain Integumentary: No rash, pruritus, skin lesions Neurological: as above Psychiatric: No depression, insomnia, anxiety Endocrine: No palpitations, fatigue, diaphoresis, mood swings, change in appetite, change in weight, increased thirst Hematologic/Lymphatic:  No purpura, petechiae. Allergic/Immunologic: no itchy/runny eyes, nasal congestion, recent allergic reactions, rashes  PHYSICAL EXAM: Blood pressure 122/72, pulse 76, height 6' (1.829 m), weight 287 lb (130.2 kg), SpO2 97 %. General: No acute distress.  Patient appears well-groomed.   Head:  Normocephalic/atraumatic Eyes:  fundi examined but not visualized Neck: supple, no paraspinal tenderness, full range of motion Back: No paraspinal tenderness Heart: regular rate and rhythm Lungs: Clear to auscultation bilaterally. Vascular: No carotid  bruits. Neurological Exam: Mental status: alert and oriented to person, place, and time, recent and remote memory intact, fund of  knowledge intact, attention and concentration intact, speech fluent and not dysarthric, language intact. Cranial nerves: CN I: not tested CN II: pupils equal, round and reactive to light, visual fields intact CN III, IV, VI:  full range of motion, no nystagmus, no ptosis CN V: facial sensation intact CN VII: upper and lower face symmetric CN VIII: hearing decreased bilaterally CN IX, X: gag intact, uvula midline CN XI: sternocleidomastoid and trapezius muscles intact CN XII: tongue midline Bulk & Tone: normal, no fasciculations. Motor:  5/5 throughout  Sensation:  Pinprick and vibration sensation reduced in feet. Deep Tendon Reflexes:  2+ throughout, toes downgoing.   Finger to nose testing:  Without dysmetria.   Heel to shin:  Without dysmetria.   Gait:  Normal station and stride.  Able to turn, difficulty with tandem walk. Romberg negative  IMPRESSION: Chronic migraine without aura, without status migrainosus, not intractable Vestibular migraine Bilateral progressive sensorineural hearing loss Numbness and tingling in lower extremities.  Possible neuropathy  PLAN: 1.  Start topiramate 24m at bedtime, titrating to goal of 570mtwice daily.  Will contact me at end of prescription for update (continue current dose or increase to 10035mwice daily) 2.  STOP OTC analgesics.  For abortive therapy, sumatriptan 14m74mDespite age, he has no known cerebrovascular disease or CAD.   3.  Limit use of pain relievers to no more than 2 days out of week to prevent risk of rebound or medication-overuse headache. 4.  To assess neuropathy, check B12, TSH, NCV-EMG of lower extremities. 5.  Caffeine cessation, sleep hygiene, hydration, exercise 6.  Follow up in 4 months.  Thank you for allowing me to take part in the care of this patient.  AdamMetta Clines  CC:    ElizPricilla Holm  EricVicie Mutters

## 2018-05-12 ENCOUNTER — Ambulatory Visit (INDEPENDENT_AMBULATORY_CARE_PROVIDER_SITE_OTHER): Payer: Medicare Other | Admitting: Neurology

## 2018-05-12 ENCOUNTER — Encounter: Payer: Self-pay | Admitting: Neurology

## 2018-05-12 ENCOUNTER — Telehealth: Payer: Self-pay | Admitting: Neurology

## 2018-05-12 ENCOUNTER — Other Ambulatory Visit: Payer: Self-pay | Admitting: Neurology

## 2018-05-12 VITALS — BP 122/72 | HR 76 | Ht 72.0 in | Wt 287.0 lb

## 2018-05-12 DIAGNOSIS — R6889 Other general symptoms and signs: Secondary | ICD-10-CM

## 2018-05-12 DIAGNOSIS — R202 Paresthesia of skin: Secondary | ICD-10-CM

## 2018-05-12 DIAGNOSIS — G43809 Other migraine, not intractable, without status migrainosus: Secondary | ICD-10-CM

## 2018-05-12 DIAGNOSIS — G43709 Chronic migraine without aura, not intractable, without status migrainosus: Secondary | ICD-10-CM

## 2018-05-12 DIAGNOSIS — R200 Anesthesia of skin: Secondary | ICD-10-CM

## 2018-05-12 DIAGNOSIS — G43109 Migraine with aura, not intractable, without status migrainosus: Secondary | ICD-10-CM | POA: Diagnosis not present

## 2018-05-12 MED ORDER — TOPIRAMATE 25 MG PO TABS: ORAL_TABLET | ORAL | 0 refills | Status: DC

## 2018-05-12 MED ORDER — SUMATRIPTAN SUCCINATE 50 MG PO TABS: 50.0000 mg | ORAL_TABLET | ORAL | 3 refills | Status: DC | PRN

## 2018-05-12 MED FILL — TOPIRAMATE 25 MG TABLET: 25 | 50 days supply | Qty: 168 | Fill #0

## 2018-05-12 MED FILL — METHOTREXATE SODIUM 2.5 MG: 2.5 | 28 days supply | Qty: 24 | Fill #3

## 2018-05-12 MED FILL — SUMATRIPTAN SUCC 50 MG TAB: 50 | 30 days supply | Qty: 10 | Fill #0

## 2018-05-12 NOTE — Patient Instructions (Addendum)
1.  Start topiramate 25mg  tablet:  Take 1 tablet at bedtime for one week, then 1 tablet twice daily for 1 week, then 2 tablets twice daily  Contact me near end of prescription for refill or change in dose. 2.  STOP all over the counter pain relievers.  At earliest onset of migraine, take sumatriptan 50mg .  May repeat dose once after 2 hours if needed, maximum 200mg  in 24 hours. 3.  Limit use of pain relievers to no more than 2 days out of week to prevent risk of rebound or medication-overuse headache. 4.  Keep headache diary 5.  Check B12, BMP, TSH and NCV-EMG of lower extremities to evaluate numbness 6.  Follow up in 4 months.  Your provider has requested that you have labwork completed today. Please go to [address] [place] [employer].

## 2018-05-12 NOTE — Telephone Encounter (Signed)
Pharm called about a medication clarification on this patient please call them back at [phone]. Thanks!

## 2018-05-12 NOTE — Telephone Encounter (Signed)
Called and spoke with pharmacist. He said call was concerning topiramate and Dr. Tomi Likens has responded to a staff message already

## 2018-05-13 LAB — TSH: TSH: 3.58 mIU/L (ref 0.40–4.50)

## 2018-05-13 LAB — BASIC METABOLIC PANEL WITH GFR
BUN: 25 mg/dL (ref 7–25)
CO2: 25 mmol/L (ref 20–32)
Calcium: 9.7 mg/dL (ref 8.6–10.3)
Chloride: 102 mmol/L (ref 98–110)
Creat: 1.24 mg/dL (ref 0.70–1.25)
GFR, EST NON AFRICAN AMERICAN: 59 mL/min/{1.73_m2} — AB (ref >=60)
GFR, Est African American: 68 mL/min/{1.73_m2} (ref >=60)
Glucose, Bld: 96 mg/dL (ref 65–139)
Potassium: 4.5 mmol/L (ref 3.5–5.3)
Sodium: 139 mmol/L (ref 135–146)

## 2018-05-13 LAB — VITAMIN B12: Vitamin B-12: 350 pg/mL (ref 200–1100)

## 2018-05-23 ENCOUNTER — Ambulatory Visit (INDEPENDENT_AMBULATORY_CARE_PROVIDER_SITE_OTHER): Payer: Medicare Other | Admitting: Neurology

## 2018-05-23 DIAGNOSIS — R202 Paresthesia of skin: Secondary | ICD-10-CM

## 2018-05-23 DIAGNOSIS — R200 Anesthesia of skin: Secondary | ICD-10-CM

## 2018-05-23 NOTE — Procedures (Signed)
[facility] Neurology  [address], [place]  [place], [place] [address] Tel: [phone] Fax:  [phone] Test Date:  05/23/2018  Patient: Michael Conway DOB: 04-11-1950 Physician: Narda Amber, DO  Sex: Male Height: 6\' 0"  Ref Phys: Metta Clines, DO  ID#: [id number] Temp: 32.5C Technician:    Patient Complaints: This is a 68 year old man referred for evaluation of bilateral leg numbness and tingling.  NCV & EMG Findings: Extensive electrodiagnostic testing of the right lower extremity and additional studies of the left shows:  1. Bilateral sural and superficial peroneal sensory responses are within normal limits. 2. Bilateral peroneal and tibial motor responses are within normal limits. 3. Bilateral tibial H reflex studies are within normal limits when adjusted for patient's height. 4. There is no evidence of active or chronic motor axonal loss changes affecting any of the tested muscles.  Motor unit configuration and recruitment pattern is within normal limits.  Impression: This is a normal study of bilateral lower extremities.  In particular, there is no evidence of a sensorimotor polyneuropathy or lumbosacral radiculopathy.   ___________________________ Narda Amber, DO    Nerve Conduction Studies Anti Sensory Summary Table   Site NR Peak (ms) Norm Peak (ms) P-T Amp (V) Norm P-T Amp  Left Sup Peroneal Anti Sensory (Ant Lat Mall)  32.5C  12 cm    3.8 <4.6 4.6 >3  Right Sup Peroneal Anti Sensory (Ant Lat Mall)  32.5C  12 cm    3.1 <4.6 5.7 >3  Left Sural Anti Sensory (Lat Mall)  32.5C  Calf    4.1 <4.6 9.1 >3  Right Sural Anti Sensory (Lat Mall)  32.5C  Calf    3.5 <4.6 8.8 >3   Motor Summary Table   Site NR Onset (ms) Norm Onset (ms) O-P Amp (mV) Norm O-P Amp Site1 Site2 Delta-0 (ms) Dist (cm) Vel (m/s) Norm Vel (m/s)  Left Peroneal Motor (Ext Dig Brev)  32.5C  Ankle    3.7 <6.0 2.5 >2.5 B Fib Ankle 10.3 42.0 41 >40  B Fib    14.0  2.2  Poplt B Fib 2.2  10.0 45 >40  Poplt    16.2  1.9         Right Peroneal Motor (Ext Dig Brev)  32.5C  Ankle    4.5 <6.0 4.8 >2.5 B Fib Ankle 8.1 37.0 46 >40  B Fib    12.6  4.0  Poplt B Fib 1.6 9.0 56 >40  Poplt    14.2  3.8         Left Tibial Motor (Abd Hall Brev)  32.5C  Ankle    3.4 <6.0 13.0 >4 Knee Ankle 10.6 0.0  >40  Knee    14.0  8.4         Right Tibial Motor (Abd Hall Brev)  32.5C  Ankle    6.0 <6.0 18.2 >4 Knee Ankle 10.2 43.0 42 >40  Knee    16.2  12.1          H Reflex Studies   NR H-Lat (ms) Lat Norm (ms) L-R H-Lat (ms)  Left Tibial (Gastroc)  32.5C     36.19 <35 0.00  Right Tibial (Gastroc)  32.5C     36.19 <35 0.00   EMG   Side Muscle Ins Act Fibs Psw Fasc Number Recrt Dur Dur. Amp Amp. Poly Poly. Comment  Right AntTibialis Nml Nml Nml Nml Nml Nml Nml Nml Nml Nml Nml Nml N/A  Right Flex Dig  Long Nml Nml Nml Nml Nml Nml Nml Nml Nml Nml Nml Nml N/A  Right GluteusMed Nml Nml Nml Nml Nml Nml Nml Nml Nml Nml Nml Nml N/A  Right Gastroc Nml Nml Nml Nml Nml Nml Nml Nml Nml Nml Nml Nml N/A  Right RectFemoris Nml Nml Nml Nml Nml Nml Nml Nml Nml Nml Nml Nml N/A  Left AntTibialis Nml Nml Nml Nml Nml Nml Nml Nml Nml Nml Nml Nml N/A  Left Gastroc Nml Nml Nml Nml Nml Nml Nml Nml Nml Nml Nml Nml N/A  Left GluteusMed Nml Nml Nml Nml Nml Nml Nml Nml Nml Nml Nml Nml N/A  Left RectFemoris Nml Nml Nml Nml Nml Nml Nml Nml Nml Nml Nml Nml N/A  Left Flex Dig Long Nml Nml Nml Nml Nml Nml Nml Nml Nml Nml Nml Nml N/A      Waveforms:

## 2018-05-25 DIAGNOSIS — R42 Dizziness and giddiness: Secondary | ICD-10-CM | POA: Diagnosis not present

## 2018-05-25 DIAGNOSIS — H8193 Unspecified disorder of vestibular function, bilateral: Secondary | ICD-10-CM | POA: Diagnosis not present

## 2018-05-31 ENCOUNTER — Telehealth: Payer: Self-pay

## 2018-05-31 NOTE — Telephone Encounter (Signed)
-----   Message from Pieter Partridge, DO sent at 05/24/2018 12:46 PM EST ----- Nerve study is normal.  Unless it really bothers him, I would just monitor for now

## 2018-05-31 NOTE — Telephone Encounter (Signed)
Called and spoke with pt, advised of results

## 2018-06-16 ENCOUNTER — Ambulatory Visit: Payer: Medicare Other | Admitting: Physical Therapy

## 2018-06-20 ENCOUNTER — Telehealth: Payer: Self-pay

## 2018-06-20 NOTE — Telephone Encounter (Signed)
-----   Message from Pieter Partridge, DO sent at 06/16/2018  2:30 PM EDT ----- Labs are unremarkable

## 2018-06-20 NOTE — Telephone Encounter (Signed)
Called and advised Pt of lab results 

## 2018-06-27 MED FILL — TRIAMTERENE/HCTZ 37.5/25 CP: 37.5-25 | 90 days supply | Qty: 90 | Fill #0

## 2018-07-06 ENCOUNTER — Ambulatory Visit: Payer: Medicare Other | Attending: Internal Medicine | Admitting: Physical Therapy

## 2018-07-06 ENCOUNTER — Other Ambulatory Visit: Payer: Self-pay

## 2018-07-06 DIAGNOSIS — R42 Dizziness and giddiness: Secondary | ICD-10-CM | POA: Diagnosis not present

## 2018-07-06 DIAGNOSIS — R2681 Unsteadiness on feet: Secondary | ICD-10-CM | POA: Insufficient documentation

## 2018-07-07 ENCOUNTER — Encounter: Payer: Self-pay | Admitting: Physical Therapy

## 2018-07-07 NOTE — Therapy (Signed)
[facility] [address] [place], [place], [address] Phone: [phone]   Fax:  [phone]  Physical Therapy Evaluation  Patient Details  Name: Michael Conway MRN: [id number] Date of Birth: 1950-11-15 Referring Provider (PT): Dr. Vicie Mutters   Encounter Date: 07/06/2018  PT End of Session - 07/07/18 1358    Visit Number  1    Number of Visits  4    Date for PT Re-Evaluation  08/05/18    Authorization Type  Medicare    Authorization Time Period  07-06-18 - 10-02-18    PT Start Time  1150    PT Stop Time  1230    PT Time Calculation (min)  40 min       History reviewed. No pertinent past medical history.  History reviewed. No pertinent surgical history.  There were no vitals filed for this visit.   Subjective Assessment - 07/07/18 1339    Subjective  Pt states his biggest problem is dizziness and balance problem - states he has tendency to trip on smooth surfaces and finds that he holds onto walls as needed for assist with balance; pt states he has tinnitus, migraines - says [facility] denied that the tinnitus and migraines are not related to his hearing loss (states 30% hearing loss)   pt reports increased HA due to barometric pressure change   Pertinent History  h/o hearing loss - due to being in [employer] (near [employer] [place]) in years past    Limitations  Walking;Other (comment)   unable to drive [place] for work   Patient Stated Goals  "Learn what I can do to manage the vertigo"    Currently in Pain?  Yes    Pain Score  5     Pain Location  Head    Pain Descriptors / Indicators  Headache    Pain Type  Chronic pain    Pain Onset  More than a month ago    Pain Frequency  Intermittent    Aggravating Factors   weather changes     Pain Relieving Factors  medication - has started taking Topimax         [facility] PT Assessment - 07/07/18 0001      Assessment   Medical Diagnosis  Vertigo; Vestibular migraines    Referring Provider (PT)  Dr. Vicie Mutters    Onset Date/Surgical Date  --   July 2019     Balance Screen   Has the patient fallen in the past 6 months  Yes    How many times?  2    Has the patient had a decrease in activity level because of a fear of falling?   No    Is the patient reluctant to leave their home because of a fear of falling?   No      Prior Function   Level of Independence  Independent    Vocation  Retired    Leisure  previously drove [place] but is unable to do so at this time due to vertigo            Vestibular Assessment - 07/07/18 0001      Vestibular Assessment   General Observation  Pt is a 68 yr old gentleman with c/o dizziness and headache due to vestibular migraines.  Pt states he has had dizziness for many years       Symptom Behavior   Subjective history of current problem  pt reports he has had  progressive hearing loss, dizziness and HA's for many years; states Meniere's disease has been ruled out with diagnosis of vestibular migraines     Type of Dizziness   Vertigo    Frequency of Dizziness  daily - pt reports headaches and dizziness on daily basis    Duration of Dizziness  minutes to hours    Symptom Nature  Constant    Aggravating Factors  Spontaneous onset    Relieving Factors  Lying supine    Progression of Symptoms  Better   with Topamax medication which was started in Feb. 2020     Oculomotor Exam   Oculomotor Alignment  Normal    Spontaneous  Absent    Smooth Pursuits  Intact   symptoms provoked   Saccades  Intact      Oculomotor Exam-Fixation Suppressed    Ocular Alignment  normal      Visual Acuity   Static  line 10    Dynamic  line 7   c/o incr. symptoms after testing completed         Objective measurements completed on examination: See above findings.              PT Education - 07/07/18 1357    Education Details  HEP - x1 viewing; gave pt handout - info on vertigo and migraines (from [facility])     Person(s)  Educated  Patient    Methods  Explanation;Demonstration;Handout    Comprehension  Verbalized understanding;Returned demonstration          PT Long Term Goals - 07/07/18 1405      PT LONG TERM GOAL #1   Title  Pt will perform x1 viewing horizontal and vertical for 60 secs with c/o vertigo </= 3/10 to demo improved gaze stabilization.    Time  4    Period  Weeks    Status  New    Target Date  08/05/18      PT LONG TERM GOAL #2   Title  Independent in HEP for vestibular exercises.     Time  4    Period  Weeks    Status  New    Target Date  08/05/18      PT LONG TERM GOAL #3   Title  Perform SOT and establish goal as appropriate.     Time  4    Period  Weeks    Status  New    Target Date  08/05/18             Plan - 07/07/18 1359    Clinical Impression Statement  Pt is a 68 yr old gentleman with prolonged h/o vertigo and c/o headaches due to vestibular migraines.  Pt reports he has daily HA's accompanied by vertigo which varies in intensity.  Pt presents with decreased gaze stabilzation with DVA 3 line difference (N= 2 line difference).  Pt will benefit from PT to address vertigo as well as balance and vestibular deficits.      Personal Factors and Comorbidities  Time since onset of injury/illness/exacerbation;Comorbidity 1;Fitness    Examination-Activity Limitations  Locomotion Level;Squat;Stand    Examination-Participation Restrictions  Meal Prep;Community Activity;Driving;Yard Work    Stability/Clinical Decision Making  Stable/Uncomplicated    Designer, jewellery  Low    Rehab Potential  Good    PT Frequency  1x / week    PT Duration  4 weeks    PT Treatment/Interventions  ADLs/Self Care Home Management;Patient/family education;Vestibular;Neuromuscular re-education;Therapeutic exercise;Balance training;Therapeutic activities  PT Next Visit Plan  do SOT - add balance on foam exercises to HEP    PT Home Exercise Plan  x1 viewing given on 07-06-18    Consulted  and Agree with Plan of Care  Patient       Patient will benefit from skilled therapeutic intervention in order to improve the following deficits and impairments:  Dizziness, Decreased balance, Difficulty walking  Visit Diagnosis: Dizziness and giddiness - Plan: PT plan of care cert/re-cert  Unsteadiness on feet - Plan: PT plan of care cert/re-cert     Problem List Patient Active Problem List   Diagnosis Date Noted  . Bronchitis 07/22/2016  . Low testosterone in male 06/17/2016  . Arthritis 06/01/2016  . Obesity (BMI 35.0-39.9 without comorbidity) 06/01/2016    Shondra Capps, [place], PT 07/07/2018, 2:10 PM  [facility] [address] [place], [place], [address] Phone: [phone]   Fax:  [phone]  Name: Michael Conway MRN: [id number] Date of Birth: 10/20/1950

## 2018-07-07 NOTE — Patient Instructions (Signed)
Gaze Stabilization: Tip Card  1.Target must remain in focus, not blurry, and appear stationary while head is in motion. 2.Perform exercises with small head movements (45 to either side of midline). 3.Increase speed of head motion so long as target is in focus. 4.If you wear eyeglasses, be sure you can see target through lens (therapist will give specific instructions for bifocal / progressive lenses). 5.These exercises may provoke dizziness or nausea. Work through these symptoms. If too dizzy, slow head movement slightly. Rest between each exercise. 6.Exercises demand concentration; avoid distractions. 7.For safety, perform standing exercises close to a counter, wall, corner, or next to someone.     Gaze Stabilization: Standing Feet Apart    Feet shoulder width apart, keeping eyes on target on wall _6___ feet away, tilt head down 15-30 and move head side to side for __60__ seconds. Repeat while moving head up and down for _60___ seconds. Do _3-5___ sessions per day. Repeat using target on pattern background.    

## 2018-07-19 MED FILL — predniSONE 10 MG TABS: 10 | 30 days supply | Qty: 30 | Fill #0

## 2018-07-19 MED FILL — METHOTREXATE SODIUM 2.5 MG: 2.5 | 28 days supply | Qty: 24 | Fill #0

## 2018-07-27 DIAGNOSIS — H838X3 Other specified diseases of inner ear, bilateral: Secondary | ICD-10-CM | POA: Diagnosis not present

## 2018-07-27 DIAGNOSIS — Z79899 Other long term (current) drug therapy: Secondary | ICD-10-CM | POA: Diagnosis not present

## 2018-08-07 ENCOUNTER — Other Ambulatory Visit: Payer: Self-pay

## 2018-08-07 ENCOUNTER — Ambulatory Visit: Payer: Medicare Other | Attending: Internal Medicine | Admitting: Physical Therapy

## 2018-08-07 DIAGNOSIS — R2681 Unsteadiness on feet: Secondary | ICD-10-CM

## 2018-08-07 DIAGNOSIS — R42 Dizziness and giddiness: Secondary | ICD-10-CM | POA: Diagnosis not present

## 2018-08-08 NOTE — Therapy (Signed)
[facility] [address] [place], [place], [address] Phone: [phone]   Fax:  [phone]  Physical Therapy Treatment  Patient Details  Name: Michael Conway MRN: [id number] Date of Birth: 08/04/1950 Referring Provider (PT): Dr. Vicie Mutters  CLINIC OPERATION CHANGES: [facility] is operating at a low capacity due to COVID-19.  The patient was brought into the clinic for evaluation and/or treatment  following universal masking by staff, social distancing, and <10 people in the clinic.  The patient's COVID risk of complications score is 4.  Encounter Date: 08/07/2018  PT End of Session - 08/08/18 1655    Visit Number  2    Number of Visits  4    Date for PT Re-Evaluation  08/05/18    Authorization Type  Medicare    Authorization Time Period  07-06-18 - 10-02-18    PT Start Time  1105    PT Stop Time  1145    PT Time Calculation (min)  40 min    Equipment Utilized During Treatment  Other (comment)   harness used on Pension scheme manager during SOT   Activity Tolerance  Patient tolerated treatment well    Behavior During Therapy  [facility] for tasks assessed/performed       No past medical history on file.  No past surgical history on file.  There were no vitals filed for this visit.  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  Yes    Pain Score  5     Pain Location  Head    Pain  Descriptors / Indicators  Headache    Pain Type  Chronic pain    Pain Onset  More than a month ago    Pain Frequency  Intermittent        NeuroRe-ed:   Sensory Organization Test (SOT)  -  Composite score 30/100 with N= 70/100  Condition 1 - Trial 1 WNL's; trials 2 and 3 slightly below normal  Condition 2 - all 3 trials below normal Condition 3 - all 3 trials below normal Condition 4 - FALL on trials 1 and 2:  Trial 3 below normal (moderately decreased) Condition 5 - FALL on all 3 trials Condition 6 - FALL on trials 1 and 2:  Trial 3 slightly below normal  Somatosensory score - 73/100 with N=90/100 Visual input score - 18/100 with N=79/100 Vestibular input 1/100 with N=53/100   Self Care:  Explained/discussed results of SOT with patient with comparative norms  Pt given info on Meniere's disease and also handout on Hydrops/diet recommendations for Disease management                     PT Education - 08/08/18 1653    Education Details  explained results of Sensory Organization Test to pt with handout of test results given to him;  gave info on Meniere's disease and Hydrops & diet management from [facility] to patient    Person(s) Educated  Patient    Methods  Explanation;Handout    Comprehension  Verbalized understanding          PT Long Term Goals - 07/07/18 1405      PT LONG TERM GOAL #1   Title  Pt will perform x1 viewing horizontal and vertical for 60 secs with c/o vertigo </= 3/10 to demo improved gaze stabilization.    Time  4    Period  Weeks    Status  New    Target Date  08/05/18      PT LONG TERM GOAL #2   Title  Independent in HEP for vestibular exercises.     Time  4    Period  Weeks    Status  New    Target Date  08/05/18      PT LONG TERM GOAL #3   Title  Perform SOT and establish goal as appropriate.     Time  4    Period  Weeks    Status  New    Target Date  08/05/18            Plan - 08/08/18 1656    Clinical Impression  Statement  Pt has significant vestibular hypofunction per results of Sensory Organization test with composite score 30/100 with N=70/100.  PT has mildly decreased use of somatosensory input and signficantly impaired use of visual input and very minimal to no use of vestibular input in maintaining balance per SOT.      Personal Factors and Comorbidities  Time since onset of injury/illness/exacerbation;Comorbidity 1;Fitness    Examination-Activity Limitations  Locomotion Level;Squat;Stand    Examination-Participation Restrictions  Meal Prep;Community Activity;Driving;Yard Work    Stability/Clinical Decision Making  Stable/Uncomplicated    Rehab Potential  Good    PT Frequency  1x / week    PT Duration  4 weeks    PT Treatment/Interventions  ADLs/Self Care Home Management;Patient/family education;Vestibular;Neuromuscular re-education;Therapeutic exercise;Balance training;Therapeutic activities    PT Next Visit Plan  add balance on foam exercises to HEP; review x1 viewing ex.    PT Home Exercise Plan  x1 viewing given on 07-06-18    Consulted and Agree with Plan of Care  Patient       Patient will benefit from skilled therapeutic intervention in order to improve the following deficits and impairments:  Dizziness, Decreased balance, Difficulty walking  Visit Diagnosis: Dizziness and giddiness  Unsteadiness on feet     Problem List Patient Active Problem List   Diagnosis Date Noted  . Bronchitis 07/22/2016  . Low testosterone in male 06/17/2016  . Arthritis 06/01/2016  . Obesity (BMI 35.0-39.9 without comorbidity) 06/01/2016    Michael Conway, [place], PT 08/08/2018, 5:08 PM  [facility] [address] [place], [place], [address] Phone: [phone]   Fax:  [phone]  Name: Michael Conway MRN: [id number] Date of Birth: 1951-03-08

## 2018-08-14 ENCOUNTER — Ambulatory Visit: Payer: Medicare Other | Admitting: Physical Therapy

## 2018-08-14 ENCOUNTER — Other Ambulatory Visit: Payer: Self-pay

## 2018-08-14 DIAGNOSIS — R2681 Unsteadiness on feet: Secondary | ICD-10-CM | POA: Diagnosis not present

## 2018-08-14 DIAGNOSIS — R42 Dizziness and giddiness: Secondary | ICD-10-CM | POA: Diagnosis not present

## 2018-08-15 NOTE — Patient Instructions (Signed)
Balance on foam - sheet with 4 positions - feet apart/together and EO and EC given for HEP

## 2018-08-15 NOTE — Therapy (Signed)
[facility] [address] [place], [place], [address] Phone: [phone]   Fax:  [phone]  Physical Therapy Treatment  Patient Details  Name: Michael Conway MRN: [id number] Date of Birth: 1951-01-03 Referring Provider (PT): Dr. Vicie Mutters   CLINIC OPERATION CHANGES: [facility] is operating at a low capacity due to COVID-19.  The patient was brought into the clinic for evaluation and/or  treatment following universal masking by staff, social distancing, and <10 people in the clinic.  The patient's COVID risk of complications score is 4.  Encounter Date: 08/14/2018  PT End of Session - 08/15/18 1308    Visit Number  3    Number of Visits  4    Date for PT Re-Evaluation  08/05/18    Authorization Type  Medicare    Authorization Time Period  07-06-18 - 10-02-18    PT Start Time  0847    PT Stop Time  0933    PT Time Calculation (min)  46 min    Activity Tolerance  Patient tolerated treatment well    Behavior During Therapy  [facility] for tasks assessed/performed       No past medical history on file.  No past surgical history on file.  There were no vitals filed for this visit.  Subjective Assessment - 08/14/18 0851    Subjective  Pt states he had virtual visit with MD, Dr. 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  Yes   Headache   Pain Score  5     Pain Location  Head    Pain Descriptors / Indicators  Headache    Pain Type   Chronic pain    Pain Onset  More than a month ago    Pain Frequency  Constant   intensity of HA varies   Aggravating Factors   barometric pressure changes            Self Care; pt discussed his diagnosis vestibular migraine vs. Meniere's disease - as pt appears to have mixed etiology with hearing loss contributing To dizziness/vestibular dysfunction            Instructed pt in balance on foam exercises - in corner - with EO with head turns and with EC with head turns for HEP  Reviewed gaze stabilization (x1 viewing exercise) in standing for HEP; instructed pt to progress from plain background to patterned  Background for incr. Challenge as needed - pt verbalized understanding   Balance Exercises - 08/15/18 1306      Balance Exercises: Standing   Standing Eyes Opened  Narrow base of support (BOS);Wide (BOA);Head turns;Foam/compliant surface;5 reps    Standing Eyes Closed  Narrow base of support (BOS);Wide (BOA);Head turns;Foam/compliant surface;5 reps        PT Education - 08/15/18 1307    Education Details  pt instructed in HEP - balance on foam with EO and EC- feet apart and together given  for HEP    Person(s) Educated  Patient    Methods  Explanation;Demonstration;Handout    Comprehension  Verbalized understanding;Returned demonstration          PT Long Term Goals - 07/07/18 1405      PT LONG TERM GOAL #1   Title  Pt will perform x1 viewing horizontal and vertical for 60 secs with c/o vertigo </= 3/10 to demo improved gaze stabilization.    Time  4    Period  Weeks    Status  New    Target Date  08/05/18      PT LONG TERM GOAL #2   Title  Independent in HEP for vestibular exercises.     Time  4    Period  Weeks    Status  New    Target Date  08/05/18      PT LONG TERM GOAL #3   Title  Perform SOT and establish goal as appropriate.     Time  4    Period  Weeks    Status  New    Target Date  08/05/18            Plan - 08/15/18 1308     Clinical Impression Statement  Pt needs UE support to safely perform balance on foam exercises due to vestibular hypofunction with decreased balance standing on compliant surface with EC and also with head turns with EO    Personal Factors and Comorbidities  Time since onset of injury/illness/exacerbation;Comorbidity 1;Fitness    PT Frequency  1x / week    PT Duration  4 weeks    PT Treatment/Interventions  ADLs/Self Care Home Management;Patient/family education;Vestibular;Neuromuscular re-education;Therapeutic exercise;Balance training;Therapeutic activities    PT Next Visit Plan  pt wishes to be placed on hold and return in approx. 2-3 months for reassessment    PT Home Exercise Plan  x1 viewing given on 07-06-18    Consulted and Agree with Plan of Care  Patient       Patient will benefit from skilled therapeutic intervention in order to improve the following deficits and impairments:  Dizziness, Decreased balance, Difficulty walking  Visit Diagnosis: Unsteadiness on feet  Dizziness and giddiness     Problem List Patient Active Problem List   Diagnosis Date Noted  . Bronchitis 07/22/2016  . Low testosterone in male 06/17/2016  . Arthritis 06/01/2016  . Obesity (BMI 35.0-39.9 without comorbidity) 06/01/2016    Briani Maul, [place], PT 08/15/2018, 1:12 PM  [facility] [address] [place], [place], [address] Phone: [phone]   Fax:  [phone]  Name: Michael Conway MRN: [id number] Date of Birth: [birth year]
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
## 2018-09-20 ENCOUNTER — Encounter: Payer: Self-pay | Admitting: Neurology

## 2018-09-20 ENCOUNTER — Other Ambulatory Visit: Payer: Self-pay

## 2018-09-20 ENCOUNTER — Telehealth (INDEPENDENT_AMBULATORY_CARE_PROVIDER_SITE_OTHER): Payer: Medicare Other | Admitting: Neurology

## 2018-09-20 VITALS — Ht 72.0 in | Wt 268.0 lb

## 2018-09-20 DIAGNOSIS — G43809 Other migraine, not intractable, without status migrainosus: Secondary | ICD-10-CM

## 2018-09-20 DIAGNOSIS — G43709 Chronic migraine without aura, not intractable, without status migrainosus: Secondary | ICD-10-CM

## 2018-09-20 MED ORDER — NORTRIPTYLINE HCL 10 MG PO CAPS: 10.0000 mg | ORAL_CAPSULE | Freq: Every day | ORAL | 3 refills | Status: DC

## 2018-09-20 MED FILL — NORTRIPTYLINE HCL 10 MG CAP: 10 | 30 days supply | Qty: 30 | Fill #0

## 2018-10-30 MED FILL — NORTRIPTYLINE HCL 10 MG CAP: 10 | 30 days supply | Qty: 30 | Fill #1

## 2018-10-30 MED FILL — predniSONE 10 MG TABS: 10 | 30 days supply | Qty: 30 | Fill #1

## 2018-12-13 MED FILL — NORTRIPTYLINE HCL 10 MG CAP: 10 | 30 days supply | Qty: 30 | Fill #2

## 2018-12-27 ENCOUNTER — Encounter: Payer: Self-pay | Admitting: Internal Medicine

## 2018-12-27 ENCOUNTER — Other Ambulatory Visit (INDEPENDENT_AMBULATORY_CARE_PROVIDER_SITE_OTHER): Payer: Medicare Other

## 2018-12-27 ENCOUNTER — Other Ambulatory Visit: Payer: Self-pay

## 2018-12-27 ENCOUNTER — Ambulatory Visit (INDEPENDENT_AMBULATORY_CARE_PROVIDER_SITE_OTHER): Payer: Medicare Other | Admitting: Internal Medicine

## 2018-12-27 VITALS — BP 120/72 | HR 78 | Temp 98.1°F | Ht 72.0 in | Wt 266.0 lb

## 2018-12-27 DIAGNOSIS — H833X3 Noise effects on inner ear, bilateral: Secondary | ICD-10-CM

## 2018-12-27 DIAGNOSIS — Z13220 Encounter for screening for lipoid disorders: Secondary | ICD-10-CM

## 2018-12-27 DIAGNOSIS — Z23 Encounter for immunization: Secondary | ICD-10-CM | POA: Diagnosis not present

## 2018-12-27 DIAGNOSIS — Z0000 Encounter for general adult medical examination without abnormal findings: Secondary | ICD-10-CM | POA: Diagnosis not present

## 2018-12-27 DIAGNOSIS — H903 Sensorineural hearing loss, bilateral: Secondary | ICD-10-CM | POA: Diagnosis not present

## 2018-12-27 DIAGNOSIS — R7989 Other specified abnormal findings of blood chemistry: Secondary | ICD-10-CM | POA: Diagnosis not present

## 2018-12-27 DIAGNOSIS — R7301 Impaired fasting glucose: Secondary | ICD-10-CM | POA: Diagnosis not present

## 2018-12-27 DIAGNOSIS — G43711 Chronic migraine without aura, intractable, with status migrainosus: Secondary | ICD-10-CM | POA: Diagnosis not present

## 2018-12-27 DIAGNOSIS — R42 Dizziness and giddiness: Secondary | ICD-10-CM | POA: Diagnosis not present

## 2018-12-27 LAB — COMPREHENSIVE METABOLIC PANEL
ALT: 17 U/L (ref 0–53)
AST: 21 U/L (ref 0–37)
Albumin: 4.4 g/dL (ref 3.5–5.2)
Alkaline Phosphatase: 72 U/L (ref 39–117)
BUN: 24 mg/dL — ABNORMAL HIGH (ref 6–23)
CO2: 25 mEq/L (ref 19–32)
Calcium: 9.2 mg/dL (ref 8.4–10.5)
Chloride: 104 mEq/L (ref 96–112)
Creatinine, Ser: 1.07 mg/dL (ref 0.40–1.50)
GFR: 68.26 mL/min (ref >60.00)
Glucose, Bld: 95 mg/dL (ref 70–99)
Potassium: 4.1 mEq/L (ref 3.5–5.1)
Sodium: 138 mEq/L (ref 135–145)
Total Bilirubin: 0.6 mg/dL (ref 0.2–1.2)
Total Protein: 7.1 g/dL (ref 6.0–8.3)

## 2018-12-27 LAB — CBC
HCT: 43.8 % (ref 39.0–52.0)
Hemoglobin: 15 g/dL (ref 13.0–17.0)
MCHC: 34.3 g/dL (ref 30.0–36.0)
MCV: 86 fl (ref 78.0–100.0)
Platelets: 222 10*3/uL (ref 150.0–400.0)
RBC: 5.09 Mil/uL (ref 4.22–5.81)
RDW: 13.8 % (ref 11.5–15.5)
WBC: 7.7 10*3/uL (ref 4.0–10.5)

## 2018-12-27 LAB — LIPID PANEL
Cholesterol: 140 mg/dL (ref 0–200)
HDL: 40.8 mg/dL (ref >39.00)
LDL Cholesterol: 71 mg/dL (ref 0–99)
NonHDL: 98.98
Total CHOL/HDL Ratio: 3
Triglycerides: 140 mg/dL (ref 0.0–149.0)
VLDL: 28 mg/dL (ref 0.0–40.0)

## 2018-12-27 LAB — HEMOGLOBIN A1C: Hgb A1c MFr Bld: 5.4 % (ref 4.6–6.5)

## 2018-12-27 NOTE — Assessment & Plan Note (Signed)
Taking nortriptyline without much success and he will continue working with neurology. May be related to the change in hearing with vertigo.

## 2018-12-27 NOTE — Assessment & Plan Note (Signed)
Referral to audiology.

## 2018-12-27 NOTE — Patient Instructions (Signed)

## 2018-12-27 NOTE — Assessment & Plan Note (Signed)
Flu shot given. Pneumonia due, declines. Shingrix due, declines. Tetanus due declines. Colonoscopy due soon and he will return to [facility]. Counseled about sun safety and mole surveillance. Counseled about the dangers of distracted driving. Given 10 year screening recommendations.
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
## 2018-12-27 NOTE — Assessment & Plan Note (Signed)
Has seen vestibular therapy PT/OT and they were unable to help him. Suspect related to the acute hearing changes. He is asked to be very careful with standing and stairs to help with balance start some core exercises like yoga or tai chi.

## 2019-01-02 DIAGNOSIS — H903 Sensorineural hearing loss, bilateral: Secondary | ICD-10-CM | POA: Diagnosis not present

## 2019-01-05 NOTE — Progress Notes (Signed)
Virtual Visit via Video Note The purpose of this virtual visit is to provide medical care while limiting exposure to the novel coronavirus.    Consent was obtained for video visit:  Yes Answered questions that patient had about telehealth interaction:  Yes I discussed the limitations, risks, security and privacy concerns of performing an evaluation and management service by telemedicine. I also discussed with the patient that there may be a patient responsible charge related to this service. The patient expressed understanding and agreed to proceed.  Pt location: Home Physician Location: office Name of referring provider:  Hoyt Koch, * I connected with Elpidio Anis at patients initiation/request on 01/08/2019 at  1:30 PM EDT by video enabled telemedicine application and verified that I am speaking with the correct person using two identifiers. Pt MRN:  [id number] Pt DOB:  [birth year] Video Participants:  Elpidio Anis   History of Present Illness:  Michael Conway is a 65 year oldright-handed Caucasian man who follows up for migraines and migraine-associated vertigo.  UPDATE: Daily vertigo and headaches.   Since last visit: Significant migraines: 5-10/10, 15 days Vertigo:  14 days Tinnitus:  5 days Falls:  twice  Current NSAIDS:  Aleve Current triptans:  Sumatriptan 13m Current antidepressant:  Nortriptyline 171mat bedtime Current vitamins/supplements:  Magnesium 50395VUZinc, D, folic acid  Coffee:  Daily Exercise:  Walks, lost 37 lbs since January Sleep hygiene:  Poor Depression:  yes  HISTORY: Since the 1960she hasmigraines andprogressive bilateral sensorineural hearing loss with tinnitusof probable autoimmune etiology.Symptoms worse after exposure to loud explosions at a shooting range in July 2019.Repeated audiometric testing has demonstrated progressive high frequency sensorineural hearing loss with continued loss of discrimination in  both ears.Autoimmune workup, including ANA, Sed Rate, RF, complement protein levels, HLA-B27, anitphospholipid antibodies, 68 Kd antibody titers, type II collagen antibodies, hepatitis B and C titers and RPR, were unremarkable. Methotrexate, high-dose oral steroids and intratympanic dexamethasone were ineffective. [facility] canals without contrast [facility] performed on 12/17/17 which was unremarkable, including CN VII and VIII and cerebellopontine angle. He was subsequently diagnosed with vestibular migraines. He reports daily headaches (at least a constant 2/10), tinnitus, and vertigo. Headaches are mild to severe posterior neck stiffness to occipital pressure but when more severe become right frontal and sometimes bilateral pounding. He is able to still function during the migraines. Severe headaches last a few hours. Hot weather or storms triggers them.    He also notes tingling in the anterior thighs occurring once a week, lasting maybe an hour. Denies back pain, radicular pain or leg weakness.  He underwent a neuropathy workup.  TSH was 3.58.  B12 was 350.  NCV-EMG from 05/23/18 of lower extremities was normal.  Past NSAIDs:  Advil Past analgesics:  BC Past antiepileptic:  Topiramate 5025mwice daily (made him more nauseous) Other past therapy:  Vestibular rehab (ineffective)  Past Medical History: Past Medical History:  Diagnosis Date   Hearing impaired person, bilateral    Migraines     Medications: Outpatient Encounter Medications as of 01/08/2019  Medication Sig   Cholecalciferol (VITAMIN D3) 1.25 MG (50000 UT) TABS Take 1.25 mg by mouth once a week.   folic acid (FOLVITE) 1 MG tablet Take 1 tablet by mouth daily.   MAGNESIUM PO Take 500 mg by mouth daily.    Multiple Vitamin (MULTIVITAMIN) tablet Take 1 tablet by mouth daily.   nortriptyline (PAMELOR) 10 MG capsule  Take 1 capsule (10 mg total) by mouth at  bedtime.   SUMAtriptan (IMITREX) 50 MG tablet Take 1 tablet (50 mg total) by mouth every 2 (two) hours as needed for migraine (Maximum 278m in 24 hours). May repeat in 2 hours if headache persists or recurs.   zinc gluconate 50 MG tablet Take 50 mg by mouth daily.   No facility-administered encounter medications on file as of 01/08/2019.     Allergies: Allergies  Allergen Reactions   Peppermint Oil Shortness Of Breath    Family History: Family History  Problem Relation Age of Onset   Cancer Other    Heart attack Mother 810  Dementia Father 854  Parkinsonism Father    Cancer Sister        lung   Lung disease Sister     Social History: Social History   Socioeconomic History   Marital status: Single    Spouse name: Not on file   Number of children: 2   Years of education: Not on file   Highest education level: Bachelor's degree (e.g., BA, AB, BS)  Occupational History   Occupation: retired  SScientist, product/process developmentstrain: Not on file   Food insecurity    Worry: Not on file    Inability: Not on fLexicographerneeds    Medical: Not on file    Non-medical: Not on file  Tobacco Use   Smoking status: Former Smoker    Types: Cigarettes   Smokeless tobacco: Never Used  Substance and Sexual Activity   Alcohol use: Yes    Comment: occasionally   Drug use: No   Sexual activity: Yes    Partners: Female  Lifestyle   Physical activity    Days per week: Not on file    Minutes per session: Not on file   Stress: Not on file  Relationships   Social connections    Talks on phone: Not on file    Gets together: Not on file    Attends religious service: Not on file    Active member of club or organization: Not on file    Attends meetings of clubs or organizations: Not on file    Relationship status: Not on file   Intimate partner violence    Fear of current or ex partner: Not on file    Emotionally abused: Not on file     Physically abused: Not on file    Forced sexual activity: Not on file  Other Topics Concern   Not on file  Social History Narrative   Patient is right-handed. He lives in a single level home. He drinks 3 cups of coffee a day and rare tea or soda. He walks daily.     Observations/Objective:   Height 6' (1.829 m), weight 261 lb (118.4 kg). No acute distress.  Alert and oriented.  Speech fluent and not dysarthric.  Language intact.  Eyes orthophoric on primary gaze.  Face symmetric.  Assessment and Plan:   1.  Chronic migraine without aura, without status migrainosus, not intractable 2.  Vestibular migraine, complicated by hearing loss. 3.  Bilateral autoimmune progressive sensorineural hearing loss     1. Due to the complexity of both having autoimmune hearing loss and migraine, [facility]  2. For preventative management, will start Aimovig 769mmonthly.  Increase nortriptyline to 2054mt bedtime for now. 3.  Limit use of pain  relievers to no more than 2 days out of week to prevent risk of rebound or medication-overuse headache. 4.  Keep headache diary 5.  Exercise, hydration, caffeine cessation, sleep hygiene, monitor for and avoid triggers 6.  Consider:  magnesium citrate 449m daily, riboflavin 4021mdaily, and coenzyme Q10 10072mhree times daily 7. Follow up 4 months.  Follow Up Instructions:    -I discussed the assessment and treatment plan with the patient. The patient was provided an opportunity to ask questions and all were answered. The patient agreed with the plan and demonstrated an understanding of the instructions.   The patient was advised to call back or seek an in-person evaluation if the symptoms worsen or if the condition fails to improve as anticipated.    Total Time spent in visit with the patient was:  31 minutes  AdaDudley MajorO

## 2019-01-08 ENCOUNTER — Encounter: Payer: Self-pay | Admitting: Neurology

## 2019-01-08 ENCOUNTER — Other Ambulatory Visit: Payer: Self-pay

## 2019-01-08 ENCOUNTER — Telehealth (INDEPENDENT_AMBULATORY_CARE_PROVIDER_SITE_OTHER): Payer: Medicare Other | Admitting: Neurology

## 2019-01-08 VITALS — Ht 72.0 in | Wt 261.0 lb

## 2019-01-08 DIAGNOSIS — H903 Sensorineural hearing loss, bilateral: Secondary | ICD-10-CM

## 2019-01-08 DIAGNOSIS — G43809 Other migraine, not intractable, without status migrainosus: Secondary | ICD-10-CM

## 2019-01-08 DIAGNOSIS — G43709 Chronic migraine without aura, not intractable, without status migrainosus: Secondary | ICD-10-CM | POA: Diagnosis not present

## 2019-01-08 MED ORDER — AIMOVIG 70 MG/ML ~~LOC~~ SOAJ: 70.0000 mg | SUBCUTANEOUS | 11 refills | Status: DC

## 2019-01-08 NOTE — Patient Instructions (Signed)
1.  Start Aimovig 70mg  injection every 30 days 2.  Increase nortriptyline to 20mg  at bedtime 3.  Will refer you to the vertigo clinic at [facility] 4. Follow up in 3 to 4 months

## 2019-01-09 ENCOUNTER — Other Ambulatory Visit: Payer: Self-pay | Admitting: *Deleted

## 2019-01-09 ENCOUNTER — Encounter: Payer: Self-pay | Admitting: *Deleted

## 2019-01-09 DIAGNOSIS — H903 Sensorineural hearing loss, bilateral: Secondary | ICD-10-CM

## 2019-01-09 DIAGNOSIS — G43809 Other migraine, not intractable, without status migrainosus: Secondary | ICD-10-CM

## 2019-01-09 DIAGNOSIS — G43709 Chronic migraine without aura, not intractable, without status migrainosus: Secondary | ICD-10-CM

## 2019-01-09 DIAGNOSIS — R200 Anesthesia of skin: Secondary | ICD-10-CM

## 2019-01-09 NOTE — Progress Notes (Signed)
Per MD order referral to [facility]/Dr. Darliss Cheney.  Faxed order and information to [phone].  Message also left on their machine at [phone]

## 2019-01-17 ENCOUNTER — Encounter: Payer: Self-pay | Admitting: *Deleted

## 2019-01-17 NOTE — Progress Notes (Signed)
Michael Conway (Key: [id number])  Your information has been submitted to [place] [employer] Part [employer]. [employer] will review the request and will issue a decision, typically within 1-3 days from your submission. You can check the updated outcome later by reopening this request.  If [employer] has not responded in 1-3 days or if you have any questions about your ePA request, please contact [place] Medicare Part D at [phone]. If you think there may be a problem with your PA request, use our live chat feature at the bottom right.

## 2019-01-19 ENCOUNTER — Other Ambulatory Visit: Payer: Self-pay | Admitting: Neurology

## 2019-01-19 MED ORDER — NORTRIPTYLINE HCL 25 MG PO CAPS: 25.0000 mg | ORAL_CAPSULE | Freq: Every day | ORAL | 3 refills | Status: DC

## 2019-01-19 MED FILL — NORTRIPTYLINE HCL CAP 25 MG: 25 | 30 days supply | Qty: 30 | Fill #0

## 2019-01-19 MED FILL — AIMOVIG 70 MG/ML SOAJ: 70 | 30 days supply | Qty: 1 | Fill #0

## 2019-01-19 NOTE — Progress Notes (Signed)
Notice of Approval of Medicare Prescription Drug Coverage Date: 01/17/2019 [facility] [address] [place], [place] [address] Member Name: Michael Conway Member ID Number: [id number] Thank you for trusting your Medicare prescription drug coverage to [employer] ([place]). As our member, we want to help you get the most value from your prescription drug coverage and help you understand how your coverage works. As a member of [employer] ([place]), we are pleased to inform you that, upon review of the information provided by you or your doctor, we have approved the requested coverage for the following prescription drug(s): AIMOVIG Soln Auto-inj Type of coverage approved: Prior Authorization This approval authorizes your coverage from 10/19/2018 - 04/17/2019, unless we notify you otherwise, and as long as the following conditions apply:  you remain enrolled in our Medicare Part D prescription drug plan,  your physician or other prescriber continues to prescribe the medication for you, and  the medication continues to be safe for treating your condition. Depending upon the strength and/or formulation of the drug prescribed by your physician, different quantity limits or safety edits may apply. Please consult your Medicare Part D plans formulary for the specific quantity limit. If you have not already filled your prescription for this approved drug, you may do so at a  participating network pharmacy. Thank you for allowing [place] to serve you. This letter is informational only. No further action is required by you at this time. If you have questions or need help, please talk to your doctor or pharmacist, or contact Customer Care at [phone], 24 hours a day, 7 days a week. TTY users may call [phone]. Thank you, [employer] ([place])

## 2019-02-01 MED FILL — TRIAMTERENE/HCTZ 37.5/25 CP: 37.5-25 | 90 days supply | Qty: 90 | Fill #1

## 2019-02-16 DIAGNOSIS — Z974 Presence of external hearing-aid: Secondary | ICD-10-CM | POA: Diagnosis not present

## 2019-02-16 DIAGNOSIS — Z79899 Other long term (current) drug therapy: Secondary | ICD-10-CM | POA: Diagnosis not present

## 2019-02-16 DIAGNOSIS — H903 Sensorineural hearing loss, bilateral: Secondary | ICD-10-CM | POA: Diagnosis not present

## 2019-02-16 DIAGNOSIS — H9123 Sudden idiopathic hearing loss, bilateral: Secondary | ICD-10-CM | POA: Diagnosis not present

## 2019-02-16 DIAGNOSIS — H9313 Tinnitus, bilateral: Secondary | ICD-10-CM | POA: Diagnosis not present

## 2019-02-16 DIAGNOSIS — H833X2 Noise effects on left inner ear: Secondary | ICD-10-CM | POA: Diagnosis not present

## 2019-02-16 DIAGNOSIS — G43719 Chronic migraine without aura, intractable, without status migrainosus: Secondary | ICD-10-CM | POA: Diagnosis not present

## 2019-02-21 DIAGNOSIS — M2011 Hallux valgus (acquired), right foot: Secondary | ICD-10-CM | POA: Diagnosis not present

## 2019-02-21 DIAGNOSIS — M79671 Pain in right foot: Secondary | ICD-10-CM | POA: Diagnosis not present

## 2019-02-21 DIAGNOSIS — L600 Ingrowing nail: Secondary | ICD-10-CM | POA: Diagnosis not present

## 2019-02-25 ENCOUNTER — Other Ambulatory Visit: Payer: Self-pay | Admitting: Neurology

## 2019-02-25 MED ORDER — VENLAFAXINE HCL ER 37.5 MG PO CP24: ORAL_CAPSULE | ORAL | 0 refills | Status: DC

## 2019-02-26 DIAGNOSIS — E291 Testicular hypofunction: Secondary | ICD-10-CM | POA: Diagnosis not present

## 2019-02-26 DIAGNOSIS — R350 Frequency of micturition: Secondary | ICD-10-CM | POA: Diagnosis not present

## 2019-02-26 DIAGNOSIS — N5201 Erectile dysfunction due to arterial insufficiency: Secondary | ICD-10-CM | POA: Diagnosis not present

## 2019-02-26 DIAGNOSIS — N401 Enlarged prostate with lower urinary tract symptoms: Secondary | ICD-10-CM | POA: Diagnosis not present

## 2019-02-26 DIAGNOSIS — R3911 Hesitancy of micturition: Secondary | ICD-10-CM | POA: Diagnosis not present

## 2019-02-26 MED FILL — VENLAFAXINE HCL ER 37.5 MG: 37.5 | 33 days supply | Qty: 60 | Fill #0

## 2019-02-26 MED FILL — TAMSULOSIN HCL 0.4 MG CAP: 0.4 | 30 days supply | Qty: 30 | Fill #0

## 2019-03-06 DIAGNOSIS — R42 Dizziness and giddiness: Secondary | ICD-10-CM | POA: Diagnosis not present

## 2019-03-06 DIAGNOSIS — G43809 Other migraine, not intractable, without status migrainosus: Secondary | ICD-10-CM | POA: Diagnosis not present

## 2019-03-06 DIAGNOSIS — H903 Sensorineural hearing loss, bilateral: Secondary | ICD-10-CM | POA: Diagnosis not present

## 2019-03-08 DIAGNOSIS — M79675 Pain in left toe(s): Secondary | ICD-10-CM | POA: Diagnosis not present

## 2019-03-08 DIAGNOSIS — L600 Ingrowing nail: Secondary | ICD-10-CM | POA: Diagnosis not present

## 2019-03-08 DIAGNOSIS — B351 Tinea unguium: Secondary | ICD-10-CM | POA: Diagnosis not present

## 2019-03-08 DIAGNOSIS — M79674 Pain in right toe(s): Secondary | ICD-10-CM | POA: Diagnosis not present

## 2019-03-08 MED FILL — TERBINAFINE HCL 250 MG TAB: 250 | 30 days supply | Qty: 30 | Fill #0

## 2019-03-09 DIAGNOSIS — K5900 Constipation, unspecified: Secondary | ICD-10-CM | POA: Diagnosis not present

## 2019-03-09 DIAGNOSIS — Z86010 Personal history of colonic polyps: Secondary | ICD-10-CM | POA: Diagnosis not present

## 2019-03-12 MED FILL — GAVILYTE-G SOLUTION: 236 | 1 days supply | Qty: 4000 | Fill #0

## 2019-04-02 ENCOUNTER — Other Ambulatory Visit: Payer: Self-pay

## 2019-04-02 ENCOUNTER — Encounter: Payer: Self-pay | Admitting: Internal Medicine

## 2019-04-02 MED ORDER — ZONISAMIDE 25 MG PO CAPS: ORAL_CAPSULE | ORAL | 0 refills | Status: DC

## 2019-04-03 ENCOUNTER — Other Ambulatory Visit: Payer: Self-pay

## 2019-04-03 MED ORDER — ZONISAMIDE 25 MG PO CAPS: ORAL_CAPSULE | ORAL | 0 refills | Status: DC

## 2019-04-06 ENCOUNTER — Other Ambulatory Visit: Payer: Self-pay

## 2019-04-06 MED ORDER — ZONISAMIDE 25 MG PO CAPS: ORAL_CAPSULE | ORAL | 0 refills | Status: DC

## 2019-05-15 ENCOUNTER — Encounter: Payer: Self-pay | Admitting: Neurology

## 2019-05-15 NOTE — Progress Notes (Signed)
Virtual Visit via Video Note The purpose of this virtual visit is to provide medical care while limiting exposure to the novel coronavirus.    Consent was obtained for video visit:  Yes Answered questions that patient had about telehealth interaction:  Yes.   I discussed the limitations, risks, security and privacy concerns of performing an evaluation and management service by telemedicine. I also discussed with the patient that there may be a patient responsible charge related to this service. The patient expressed understanding and agreed to proceed.  Pt location: Home Physician Location: office Name of referring provider:  Hoyt Koch, * I connected with Michael Conway at patients initiation/request on 05/16/2019 at 10:50 AM EST by video enabled telemedicine application and verified that I am speaking with the correct person using two identifiers. Pt MRN:  [id number] Pt DOB:  1951/01/13 Video Participants:  Michael Conway   History of Present Illness:  Michael Conway a 18 year oldright-handed Caucasian man whofollows up for migraines and migraine-associated vertigo.  UPDATE: He was referred to the vestibular clinic at [facility] where he was evaluated by Dr. Amado Coe.  Abnormal vestibular exam was consistent with peripheral vestibular hypofunction affecting the left ear (Bithermal air caloric shows a 33% left unilateral weakness, video Head Impulse Test abnormal with repeatable corrective saccades following leftward head impulse. Vertigo is likely associated with migraines and believed to not be well-treated until migraines are controlled.  We increased nortriptyline at bedtime last visit and planned to initiate Aimovig.  Aimovig was approved but too expensive.  Nortriptyline caused side effects.  He was switched to zonisamide which caused constipation.   Daily vertigo and headaches.   Since last visit: Significant migraines: 5-10/10, 10 days Vertigo:  14 days Tinnitus:   daily Falls: no falls  Current NSAIDS:Aleve Current triptans:Sumatriptan1m (raely takes it, causes constipation) Current vitamins/supplements:  Magnesium 5086VH Zinc, D, folic acid  Coffee:  Daily Exercise:  Walks, lost 37 lbs since January Sleep hygiene:  Poor Depression:  yes  HISTORY: Since the 119s he hasmigraines andprogressive bilateral sensorineural hearing loss with tinnitusof probable autoimmune etiology.Symptoms worse after exposure to loud explosionsat a shooting range in July 2019.Repeated audiometric testing has demonstrated progressive high frequency sensorineural hearing loss with continued loss of discrimination in both ears.Autoimmune workup, including ANA, Sed Rate, RF, complement protein levels, HLA-B27, anitphospholipid antibodies, 68 Kd antibody titers, type II collagen antibodies, hepatitis B and C titers and RPR, were unremarkable. Methotrexate, high-dose oral steroids and intratympanic dexamethasone were ineffective. [facility] without contrast [facility] on 12/17/17 which was unremarkable, including CN VII and VIII and cerebellopontine angle. He was subsequently diagnosed with vestibular migraines. He reports daily headaches (at least a constant 2/10),tinnitus, and vertigo. Headaches are mild to severe posterior neck stiffness to occipital pressure but when more severe become right frontal and sometimes bilateral pounding. He is able to still function during the migraines. Severe headaches last a few hours. Hot weather or storms triggers them.    He also notes tingling in the anterior thighs occurring once a week, lasting maybe an hour. Denies back pain, radicular pain or leg weakness. He underwent a neuropathy workup. TSH was 3.58. B12 was 350. NCV-EMG from 05/23/18 of lower extremities was normal.  Past NSAIDs:  Advil Past analgesics:  BC Past antidepressant:   Nortriptyline 21m(constipation) Past antiepileptic:  Topiramate 5033mwice daily (made him more nauseous) Other past therapy:  Vestibular rehab (ineffective)  Past Medical History: Past Medical History:  Diagnosis Date  . Hearing impaired person, bilateral   . Migraines     Medications: Outpatient Encounter Medications as of 05/16/2019  Medication Sig  . Cholecalciferol (VITAMIN D3) 1.25 MG (50000 UT) TABS Take 1.25 mg by mouth once a week.  Eduard Roux (AIMOVIG) 70 MG/ML SOAJ Inject 70 mg into the skin every 30 (thirty) days.  . folic acid (FOLVITE) 1 MG tablet Take 1 tablet by mouth daily.  Marland Kitchen MAGNESIUM PO Take 500 mg by mouth daily.   . Multiple Vitamin (MULTIVITAMIN) tablet Take 1 tablet by mouth daily.  . nortriptyline (PAMELOR) 25 MG capsule Take 1 capsule (25 mg total) by mouth at bedtime.  . SUMAtriptan (IMITREX) 50 MG tablet Take 1 tablet (50 mg total) by mouth every 2 (two) hours as needed for migraine (Maximum 265m in 24 hours). May repeat in 2 hours if headache persists or recurs.  . venlafaxine XR (EFFEXOR XR) 37.5 MG 24 hr capsule Take 1 capsule daily for one week, then 2 capsules daily  . zinc gluconate 50 MG tablet Take 50 mg by mouth daily.  .Marland Kitchenzonisamide (ZONEGRAN) 25 MG capsule Please dispense 1 tablet po qd x7 days Then increase to 2 tablets po qdX7 days Then  Increase to 3 tablets daily for 7 days Then increase to 4 tablets daily thereafter   No facility-administered encounter medications on file as of 05/16/2019.    Allergies: Allergies  Allergen Reactions  . Peppermint Oil Shortness Of Breath    Family History: Family History  Problem Relation Age of Onset  . Cancer Other   . Heart attack Mother 870 . Dementia Father 830 . Parkinsonism Father   . Cancer Sister        lung  . Lung disease Sister     Social History: Social History   Socioeconomic History  . Marital status: Single    Spouse name: Not on file  . Number of children: 2  .  Years of education: Not on file  . Highest education level: Bachelor's degree (e.g., BA, AB, BS)  Occupational History  . Occupation: retired  Tobacco Use  . Smoking status: Former Smoker    Types: Cigarettes  . Smokeless tobacco: Never Used  Substance and Sexual Activity  . Alcohol use: Yes    Comment: occasionally  . Drug use: No  . Sexual activity: Yes    Partners: Female  Other Topics Concern  . Not on file  Social History Narrative   Patient is right-handed. He lives in a single level home. He drinks 3 cups of coffee a day and rare tea or soda. He walks daily.       Education - college grad   Social Determinants of Health   Financial Resource Strain:   . Difficulty of Paying Living Expenses: Not on file  Food Insecurity:   . Worried About RCharity fundraiserin the Last Year: Not on file  . Ran Out of Food in the Last Year: Not on file  Transportation Needs:   . Lack of Transportation (Medical): Not on file  . Lack of Transportation (Non-Medical): Not on file  Physical Activity:   . Days of Exercise per Week: Not on file  . Minutes of Exercise per Session: Not on file  Stress:   . Feeling of Stress : Not on file  Social Connections:   . Frequency of Communication with Friends and Family: Not on file  .  Frequency of Social Gatherings with Friends and Family: Not on file  . Attends Religious Services: Not on file  . Active Member of Clubs or Organizations: Not on file  . Attends Archivist Meetings: Not on file  . Marital Status: Not on file  Intimate Partner Violence:   . Fear of Current or Ex-Partner: Not on file  . Emotionally Abused: Not on file  . Physically Abused: Not on file  . Sexually Abused: Not on file    Observations/Objective:   Height 6' (1.829 m), weight 260 lb (117.9 kg). No acute distress.  Alert and oriented.  Speech fluent and not dysarthric.  Language intact.  Eyes orthophoric on primary gaze.  Face symmetric.  Assessment and  Plan:   1.  Chronic migraine without aura, without status migrainosus, not intractable 2.  Migraine-associated vertigo 3.  Left vestibular hypofunction 4.  Sensorineural hearing loss.  1.  [facility] if Aimovig or Emgality could be more affordable.  He would benefit from Botox (over 15 headache days a month for well over 3 consecutive months and has failed other migraine preventatives such as topiramate, nortriptyline, and zonisamide 2.  For abortive therapy, will try rizatriptan 9m instead. 3.  Limit use of pain relievers to no more than 2 days out of week to prevent risk of rebound or medication-overuse headache. 4.  Keep headache diary 5.  Exercise, hydration, caffeine cessation, sleep hygiene, monitor for and avoid triggers 6. Follow up for Botox if approved, otherwise in 4 months.   Follow Up Instructions:    -I discussed the assessment and treatment plan with the patient. The patient was provided an opportunity to ask questions and all were answered. The patient agreed with the plan and demonstrated an understanding of the instructions.   The patient was advised to call back or seek an in-person evaluation if the symptoms worsen or if the condition fails to improve as anticipated.   ADudley Major DO

## 2019-05-16 ENCOUNTER — Encounter: Payer: Self-pay | Admitting: *Deleted

## 2019-05-16 ENCOUNTER — Other Ambulatory Visit: Payer: Self-pay

## 2019-05-16 ENCOUNTER — Telehealth (INDEPENDENT_AMBULATORY_CARE_PROVIDER_SITE_OTHER): Payer: Medicare Other | Admitting: Neurology

## 2019-05-16 VITALS — Ht 72.0 in | Wt 260.0 lb

## 2019-05-16 DIAGNOSIS — G43109 Migraine with aura, not intractable, without status migrainosus: Secondary | ICD-10-CM | POA: Diagnosis not present

## 2019-05-16 DIAGNOSIS — H832X2 Labyrinthine dysfunction, left ear: Secondary | ICD-10-CM

## 2019-05-16 DIAGNOSIS — G43709 Chronic migraine without aura, not intractable, without status migrainosus: Secondary | ICD-10-CM

## 2019-05-16 DIAGNOSIS — H903 Sensorineural hearing loss, bilateral: Secondary | ICD-10-CM | POA: Diagnosis not present

## 2019-05-16 MED ORDER — RIZATRIPTAN BENZOATE 10 MG PO TABS: ORAL_TABLET | ORAL | 3 refills | Status: DC

## 2019-05-16 NOTE — Progress Notes (Addendum)
Michael Conway  Patient Name  Michael Conway, Michael Conway Date  05/16/2019 Service Request # (SR #)  [id number] Type Benefit Verification  Location  [facility] Prescriber  Metta Clines Status In Progress  Response from [facility] #:    [id number]  Referral Required:    No  Date Verified:    05/16/2019 Plan Name:    [place]     Payer Phone:    [phone] [place] ICD Code(s) : [id number]  # of BOTOX Units: 155 BV Result  PA/Pre-D Required No Copay $0.00 Coinsurance 0% [id number]   Covered - Per Insurer Guidelines  BOTOX [id number]) Buy and Bill Covered - Per Insurer Guidelines

## 2019-05-17 ENCOUNTER — Ambulatory Visit: Payer: Medicare Other | Admitting: Neurology

## 2019-05-23 NOTE — Telephone Encounter (Signed)
Benefit verification from [employer] stated This is a covered procedure and no prior authorization is required for this diagnosis.

## 2019-06-07 ENCOUNTER — Telehealth: Payer: Self-pay | Admitting: Gastroenterology

## 2019-06-07 NOTE — Telephone Encounter (Signed)
Dr. Ardis Hughs, this pt requested to transfer care from Dr. Michail Sermon to [facility].  He would like to be seen for chronic constipation.  Pt stated that he feels that his concerns were being dismissed.  Pt has had multiple colonoscopies.  All records will be sent to you for review.  Will you accept him as a patient?

## 2019-06-08 ENCOUNTER — Other Ambulatory Visit: Payer: Self-pay

## 2019-06-08 ENCOUNTER — Ambulatory Visit (INDEPENDENT_AMBULATORY_CARE_PROVIDER_SITE_OTHER): Payer: Medicare Other | Admitting: Neurology

## 2019-06-08 DIAGNOSIS — G43711 Chronic migraine without aura, intractable, with status migrainosus: Secondary | ICD-10-CM

## 2019-06-08 MED ORDER — ONABOTULINUMTOXINA 100 UNITS IJ SOLR
100.0000 [IU] | Freq: Once | INTRAMUSCULAR | Status: AC
  Administered 2019-06-08: 13:00:00 155 [IU] via INTRAMUSCULAR

## 2019-06-08 NOTE — Progress Notes (Signed)
Botulinum Clinic   Procedure Note Botox  Attending: Dr. Metta Clines  Preoperative Diagnosis(es): Chronic migraine  Consent obtained from: The patient Benefits discussed included, but were not limited to decreased muscle tightness, increased joint range of motion, and decreased pain.  Risk discussed included, but were not limited pain and discomfort, bleeding, bruising, excessive weakness, venous thrombosis, muscle atrophy and dysphagia.  Anticipated outcomes of the procedure as well as he risks and benefits of the alternatives to the procedure, and the roles and tasks of the personnel to be involved, were discussed with the patient, and the patient consents to the procedure and agrees to proceed. A copy of the patient medication guide was given to the patient which explains the blackbox warning.  Patients identity and treatment sites confirmed Yes.  .  Details of Procedure: Skin was cleaned with alcohol. Prior to injection, the needle plunger was aspirated to make sure the needle was not within a blood vessel.  There was no blood retrieved on aspiration.    Following is a summary of the muscles injected  And the amount of Botulinum toxin used:  Dilution 200 units of Botox was reconstituted with 4 ml of preservative free normal saline. Time of reconstitution: At the time of the office visit (<30 minutes prior to injection)   Injections  155 total units of Botox was injected with a 30 gauge needle.  Injection Sites: L occipitalis: 15 units- 3 sites  R occiptalis: 15 units- 3 sites  L upper trapezius: 15 units- 3 sites R upper trapezius: 15 units- 3 sits          L paraspinal: 10 units- 2 sites R paraspinal: 10 units- 2 sites  Face L frontalis(2 injection sites):10 units   R frontalis(2 injection sites):10 units         L corrugator: 5 units   R corrugator: 5 units           Procerus: 5 units   L temporalis: 20 units R temporalis: 20 units   Agent:  200 units of botulinum Type  A (Onobotulinum Toxin type A) was reconstituted with 4 ml of preservative free normal saline.  Time of reconstitution: At the time of the office visit (<30 minutes prior to injection)     Total injected (Units): 155  Total wasted (Units): 5  Patient tolerated procedure well without complications.   Reinjection is anticipated in 3 months. Return to clinic in 4 1/2 months

## 2019-06-15 ENCOUNTER — Encounter: Payer: Self-pay | Admitting: Gastroenterology

## 2019-06-15 NOTE — Telephone Encounter (Signed)
These records were forwarded to me as DOD for 06/07/2019.  I do not know for sure what else I can offer for treatment, but I will see him in consultation for another opinion if he likes.

## 2019-06-25 ENCOUNTER — Ambulatory Visit: Payer: Medicare Other | Admitting: Internal Medicine

## 2019-07-05 ENCOUNTER — Ambulatory Visit (INDEPENDENT_AMBULATORY_CARE_PROVIDER_SITE_OTHER): Payer: Medicare Other | Admitting: Internal Medicine

## 2019-07-05 ENCOUNTER — Other Ambulatory Visit: Payer: Self-pay

## 2019-07-05 ENCOUNTER — Encounter: Payer: Self-pay | Admitting: Internal Medicine

## 2019-07-05 VITALS — BP 134/72 | HR 79 | Temp 98.7°F | Resp 16 | Ht 72.0 in | Wt 272.0 lb

## 2019-07-05 DIAGNOSIS — R0602 Shortness of breath: Secondary | ICD-10-CM | POA: Diagnosis not present

## 2019-07-05 NOTE — Patient Instructions (Addendum)
We have done the EKG today which looks normal.   We will get you in for a stress test to check for blockages.

## 2019-07-05 NOTE — Progress Notes (Signed)
   Subjective:   Patient ID: Michael Conway, male    DOB: 11/18/1950, 69 y.o.   MRN: [id number]  HPI The patient is a 69 YO man coming in for chest tightness and severe SOB with exertion. He is currently retired due to several factors but previously delivered packages and was able to walk long distances and lift large amounts of weight without difficulty. In the last several months he is unable to walk up a flight of stairs without getting winded and stopping, he is unable to lift >20 pounds without SOB. He denies pain in the chest but sometimes gets tightness. He is also dealing with several other issues including his tinnitus and some depression. He is not sure if these symptoms are related to depression and retirement but has a lot of family history of cardiac issues. Prior stress testing and echo 2014 unremarkable.   Review of Systems  Constitutional: Positive for activity change.  HENT: Positive for tinnitus.   Eyes: Negative.   Respiratory: Positive for chest tightness and shortness of breath. Negative for cough.   Cardiovascular: Negative for chest pain, palpitations and leg swelling.  Gastrointestinal: Negative for abdominal distention, abdominal pain, constipation, diarrhea, nausea and vomiting.  Musculoskeletal: Negative.   Skin: Negative.   Neurological: Negative.   Psychiatric/Behavioral: Negative.     Objective:  Physical Exam Constitutional:      Appearance: He is well-developed. He is obese.  HENT:     Head: Normocephalic and atraumatic.  Cardiovascular:     Rate and Rhythm: Normal rate and regular rhythm.  Pulmonary:     Effort: Pulmonary effort is normal. No respiratory distress.     Breath sounds: Normal breath sounds. No wheezing or rales.  Abdominal:     General: Bowel sounds are normal. There is no distension.     Palpations: Abdomen is soft.     Tenderness: There is no abdominal tenderness. There is no rebound.  Musculoskeletal:     Cervical back: Normal  range of motion.  Skin:    General: Skin is warm and dry.  Neurological:     Mental Status: He is alert and oriented to person, place, and time.     Coordination: Coordination normal.    Vitals:   07/05/19 1300  BP: 134/72  Pulse: 79  Resp: 16  Temp: 98.7 F (37.1 C)  TempSrc: Oral  SpO2: 98%  Weight: 272 lb (123.4 kg)  Height: 6' (1.829 m)   EKG: Rate 72, axis normal, interval normal, sinus, no st or t wave changes  This visit occurred during the SARS-CoV-2 public health emergency.  Safety protocols were in place, including screening questions prior to the visit, additional usage of staff PPE, and extensive cleaning of exam room while observing appropriate contact time as indicated for disinfecting solutions.   Assessment & Plan:  Visit time 15 minutes in face to face communication with patient and coordination of care, additional 15 minutes spent in record review, coordination or care, ordering tests, communicating/referring to other healthcare professionals, documenting in medical records all on the same day of the visit for total time 30 minutes spent on the visit.

## 2019-07-06 ENCOUNTER — Encounter: Payer: Self-pay | Admitting: Internal Medicine

## 2019-07-06 DIAGNOSIS — R0602 Shortness of breath: Secondary | ICD-10-CM | POA: Insufficient documentation

## 2019-07-06 NOTE — Assessment & Plan Note (Signed)
EKG done today without changes. Ordered stress testing lexiscan as he is unable to walk treadmill with his vertigo at this time due to instability.

## 2019-07-09 ENCOUNTER — Encounter (HOSPITAL_COMMUNITY): Payer: Medicare Other

## 2019-07-09 ENCOUNTER — Telehealth (HOSPITAL_COMMUNITY): Payer: Self-pay

## 2019-07-09 NOTE — Telephone Encounter (Signed)
Detailed instructions were left on the patient's answering machine. Asked to call back with any questions. S.Garlen Reinig EMTP  °

## 2019-07-12 ENCOUNTER — Other Ambulatory Visit: Payer: Self-pay

## 2019-07-12 ENCOUNTER — Ambulatory Visit (HOSPITAL_COMMUNITY): Payer: Medicare Other | Attending: Internal Medicine

## 2019-07-12 DIAGNOSIS — R0602 Shortness of breath: Secondary | ICD-10-CM | POA: Insufficient documentation

## 2019-07-12 LAB — MYOCARDIAL PERFUSION IMAGING
LV dias vol: 91 mL (ref 62–150)
LV sys vol: 33 mL
Peak HR: 90 {beats}/min
Rest HR: 59 {beats}/min
SDS: 2
SRS: 0
SSS: 2
TID: 1.01

## 2019-07-12 MED ORDER — TECHNETIUM TC 99M TETROFOSMIN IV KIT
9.9000 | PACK | Freq: Once | INTRAVENOUS | Status: AC | PRN
  Administered 2019-07-12: 9.9 via INTRAVENOUS
  Filled 2019-07-12: qty 10

## 2019-07-12 MED ORDER — TECHNETIUM TC 99M TETROFOSMIN IV KIT
32.9000 | PACK | Freq: Once | INTRAVENOUS | Status: AC | PRN
  Administered 2019-07-12: 32.9 via INTRAVENOUS
  Filled 2019-07-12: qty 33

## 2019-07-12 MED ORDER — REGADENOSON 0.4 MG/5ML IV SOLN
0.4000 mg | Freq: Once | INTRAVENOUS | Status: AC
  Administered 2019-07-12: 0.4 mg via INTRAVENOUS

## 2019-07-17 ENCOUNTER — Encounter: Payer: Self-pay | Admitting: Gastroenterology

## 2019-07-17 ENCOUNTER — Ambulatory Visit (INDEPENDENT_AMBULATORY_CARE_PROVIDER_SITE_OTHER): Payer: Medicare Other | Admitting: Gastroenterology

## 2019-07-17 VITALS — BP 134/70 | HR 72 | Temp 98.7°F | Ht 70.0 in | Wt 276.2 lb

## 2019-07-17 DIAGNOSIS — K5909 Other constipation: Secondary | ICD-10-CM | POA: Diagnosis not present

## 2019-07-17 DIAGNOSIS — Z86010 Personal history of colonic polyps: Secondary | ICD-10-CM | POA: Diagnosis not present

## 2019-07-17 MED ORDER — NA SULFATE-K SULFATE-MG SULF 17.5-3.13-1.6 GM/177ML PO SOLN: 1.0000 | Freq: Once | ORAL | 0 refills | Status: AC

## 2019-07-17 NOTE — Patient Instructions (Signed)
If you are age 71 or older, your body mass index should be between 23-30. Your Body mass index is 39.64 kg/m. If this is out of the aforementioned range listed, please consider follow up with your Primary Care Provider.  If you are age 68 or younger, your body mass index should be between 19-25. Your Body mass index is 39.64 kg/m. If this is out of the aformentioned range listed, please consider follow up with your Primary Care Provider.   You have been scheduled for a colonoscopy. Please follow written instructions given to you at your visit today.  Please pick up your prep supplies at the pharmacy within the next 1-3 days. If you use inhalers (even only as needed), please bring them with you on the day of your procedure.  It was a pleasure to see you today!  Dr. Loletha Carrow

## 2019-07-17 NOTE — Progress Notes (Signed)
[facility] Consult Note:  History: Michael Conway 07/17/2019  Referring provider: Hoyt Koch, MD  Reason for consult/chief complaint: Constipation (medications are causing constipation), hx of colon polyps (Last colon with Michael Conway Jan 2021, wasn't cleaned out good, wants all information with Michael Conway), and rectal hernia (told by Michael Conway)   Subjective  HPI: This is a 69 year old man who requested another opinion regarding chronic constipation.  He was a patient of Michael Conway at [facility] until several months ago.  Last office note from Michael. Michail Conway was a telemedicine visit on 03/09/2019 describing chronic constipation, recommending treatment with fiber supplement.  Her adenoma reportedly removed in December 2014, follow-up colonoscopy 04/13/2019.  Reportedly poor preparation, tortuous and redundant colon with scope looping.  Cecum was reached, and a 2 mm adenomatous polyp was removed.  Additional 3 mm sigmoid polyp removed with hot snare, no tissue retrieved.  6 mm sigmoid polyp removed with hot snare, tissue reportedly received, but only one specimen in pathology.  Internal hemorrhoids noted, as well as diffuse melanosis. Same challenging anatomy and fair preparation noted in December 2014 colonoscopy report Previous pathology report notes tubular adenoma and hyperplastic polyps and fair preparation in April 2008.  Michael Conway describes chronic constipation, and even remembers having some trouble with it starting back when he was in the service.  He attributes that to being unable to use the bathroom he would feel the urge to do so.  For years now he will have a BM perhaps every 5 to 7 days until he recently started MiraLAX.  Increasing fiber as recommended by Michael. Michail Conway seem to make him more bloated and gassy. On MiraLAX daily and the stool is soft and he will go 1 or 2 times a day, but the stool still thin he does not always feel completely evacuated.  He  may have to go again soon afterwards and there is difficulty getting clean after BM.  Previously, he would not feel the urge for a BM for 3 to 5 days.  Now on MiraLAX he does have the sensation, but when he goes he still might not pass very much. He denies anorectal pain before or after bowel movements. ROS:  Review of Systems  Constitutional: Negative for appetite change and unexpected weight change.  HENT: Negative for mouth sores and voice change.   Eyes: Negative for pain and redness.  Respiratory: Negative for cough and shortness of breath.   Cardiovascular: Negative for chest pain and palpitations.  Genitourinary: Negative for dysuria and hematuria.  Musculoskeletal: Negative for arthralgias and myalgias.  Skin: Negative for pallor and rash.  Neurological: Negative for weakness and headaches.       Hearing impairment  Hematological: Negative for adenopathy.  Psychiatric/Behavioral:       Migraines   He has tried a few different medicines to decrease migraines, but found that they worsened his constipation and he had to stop them.  Past Medical History: Past Medical History:  Diagnosis Date  . Colon polyps   . Depression   . Hearing impaired person, bilateral   . Hypertension   . Low testosterone   . Migraines   . Vestibular migraine      Past Surgical History: Past Surgical History:  Procedure Laterality Date  . BICEPS TENDON REPAIR Left   . INNER EAR SURGERY Right   . KNEE ARTHROSCOPY Left   . ROTATOR CUFF REPAIR Bilateral   . TYMPANOSTOMY TUBE PLACEMENT  Family History: Family History  Problem Relation Age of Onset  . Prostate cancer Other   . Heart attack Mother 13  . Heart failure Mother   . Hypertension Mother   . Dementia Father 22  . Parkinsonism Father   . Lung cancer Sister   . Lung disease Sister   . Hypertension Sister     Social History: Social History   Socioeconomic History  . Marital status: Single    Spouse name: Not on file   . Number of children: 2  . Years of education: Not on file  . Highest education level: Bachelor's degree (e.g., BA, AB, BS)  Occupational History  . Occupation: retired  Tobacco Use  . Smoking status: Former Smoker    Types: Cigarettes    Quit date: 2002    Years since quitting: 19.3  . Smokeless tobacco: Never Used  Substance and Sexual Activity  . Alcohol use: Yes    Comment: occasionally  . Drug use: No  . Sexual activity: Yes    Partners: Female  Other Topics Concern  . Not on file  Social History Narrative   Patient is right-handed. He lives in a single level home. He drinks 3 cups of coffee a day and rare tea or soda. He walks daily.       Education - college grad   Social Determinants of Radio broadcast assistant Strain:   . Difficulty of Paying Living Expenses:   Food Insecurity:   . Worried About Charity fundraiser in the Last Year:   . Arboriculturist in the Last Year:   Transportation Needs:   . Film/video editor (Medical):   Michael Kitchen Lack of Transportation (Non-Medical):   Physical Activity:   . Days of Exercise per Week:   . Minutes of Exercise per Session:   Stress:   . Feeling of Stress :   Social Connections:   . Frequency of Communication with Friends and Family:   . Frequency of Social Gatherings with Friends and Family:   . Attends Religious Services:   . Active Member of Clubs or Organizations:   . Attends Archivist Meetings:   Michael Kitchen Marital Status:     Allergies: Allergies  Allergen Reactions  . Peppermint Oil Shortness Of Breath    Outpatient Meds: Current Outpatient Medications  Medication Sig Dispense Refill  . Cholecalciferol (VITAMIN D3) 1.25 MG (50000 UT) TABS Take 1.25 mg by mouth once a week.    . folic acid (FOLVITE) 1 MG tablet Take 1 tablet by mouth daily.    Michael Kitchen MAGNESIUM PO Take 500 mg by mouth daily.     . Multiple Vitamin (MULTIVITAMIN) tablet Take 1 tablet by mouth daily.    . OnabotulinumtoxinA (BOTOX IJ)  Inject as directed as needed.    . rizatriptan (MAXALT) 10 MG tablet Take 1 tablet earliest onset of migraine.  May repeat in 2 hours if needed.  Maximum 2 tablets in 24 hours 10 tablet 3  . terbinafine (LAMISIL) 250 MG tablet Take 1 tablet by mouth as needed.    . zinc gluconate 50 MG tablet Take 50 mg by mouth daily.    . Na Sulfate-K Sulfate-Mg Sulf 17.5-3.13-1.6 GM/177ML SOLN Take 1 kit by mouth once for 1 dose. 354 mL 0   No current facility-administered medications for this visit.      ___________________________________________________________________ Objective   Exam:  BP 134/70 (BP Location: Left Arm, Patient Position: Sitting, Cuff Size: Normal)  Pulse 72   Temp 98.7 F (37.1 C)   Ht 5' 10"  (1.778 m) Comment: height measured without shoes  Wt 276 lb 4 oz (125.3 kg)   BMI 39.64 kg/m    General: Well-appearing, pleasant and conversational  Eyes: sclera anicteric, no redness  ENT: oral mucosa moist without lesions, no cervical or supraclavicular lymphadenopathy  CV: RRR without murmur, S1/S2, no JVD, no peripheral edema  Resp: clear to auscultation bilaterally, normal RR and effort noted  GI: soft, no tenderness, with active bowel sounds. No guarding or palpable organomegaly noted, though limited by body habitus.  Skin; warm and dry, no rash or jaundice noted  Neuro: awake, alert and oriented x 3. Normal gross motor function and fluent speech Rectal exam deferred today. Labs:  CBC Latest Ref Rng & Units 12/27/2018 06/02/2016  WBC 4.0 - 10.5 K/uL 7.7 7.9  Hemoglobin 13.0 - 17.0 g/dL 15.0 15.7  Hematocrit 39.0 - 52.0 % 43.8 45.6  Platelets 150.0 - 400.0 K/uL 222.0 234.0   CMP Latest Ref Rng & Units 12/27/2018 05/12/2018 06/02/2016  Glucose 70 - 99 mg/dL 95 96 99  BUN 6 - 23 mg/dL 24(H) 25 22  Creatinine 0.40 - 1.50 mg/dL 1.07 1.24 1.18  Sodium 135 - 145 mEq/L 138 139 139  Potassium 3.5 - 5.1 mEq/L 4.1 4.5 4.6  Chloride 96 - 112 mEq/L 104 102 105  CO2 19 - 32  mEq/L 25 25 26   Calcium 8.4 - 10.5 mg/dL 9.2 9.7 9.6  Total Protein 6.0 - 8.3 g/dL 7.1 - 7.1  Total Bilirubin 0.2 - 1.2 mg/dL 0.6 - 0.6  Alkaline Phos 39 - 117 U/L 72 - 72  AST 0 - 37 U/L 21 - 23  ALT 0 - 53 U/L 17 - 22    Colonoscopy and pathology results as noted above   Assessment: Encounter Diagnoses  Name Primary?  . Chronic constipation Yes  . Personal history of colonic polyps     Chronic constipation, at least in part seems due to redundant and tortuous colon anatomy noted on multiple prior reports.  It sounds like he might also have some dyssynergic defecation. We had a long discussion about this, I reviewed all his records with him, he did not know the prep of been fair on multiple prior reports.  He was not sure the result of his recent polyps as he did not recall getting a result letter about that.  I showed him diagrams of the anatomy and all questions were answered.  Plan:  Colonoscopy with 2-day preparation for polyp surveillance.  I will do a presedation rectal exam that day to assess anorectal muscle function.  Possible anorectal manometry study to follow.  Continue once daily MiraLAX, and recommended he continue his periodic use of stimulant laxatives (Ex-Lax/Dulcolax) twice a week to get better evacuation.  Thank you for the courtesy of this consult.  Please call me with any questions or concerns.  Nelida Meuse III  CC: Referring provider noted above

## 2019-08-29 ENCOUNTER — Ambulatory Visit (AMBULATORY_SURGERY_CENTER): Payer: Medicare Other | Admitting: Gastroenterology

## 2019-08-29 ENCOUNTER — Encounter: Payer: Self-pay | Admitting: Gastroenterology

## 2019-08-29 ENCOUNTER — Other Ambulatory Visit: Payer: Self-pay

## 2019-08-29 VITALS — BP 132/82 | HR 67 | Temp 96.8°F | Resp 13 | Ht 70.0 in | Wt 276.0 lb

## 2019-08-29 DIAGNOSIS — K6389 Other specified diseases of intestine: Secondary | ICD-10-CM | POA: Diagnosis not present

## 2019-08-29 DIAGNOSIS — Z86010 Personal history of colonic polyps: Secondary | ICD-10-CM

## 2019-08-29 DIAGNOSIS — Z538 Procedure and treatment not carried out for other reasons: Secondary | ICD-10-CM | POA: Diagnosis not present

## 2019-08-29 MED ORDER — SODIUM CHLORIDE 0.9 % IV SOLN: 500.0000 mL | Freq: Once | INTRAVENOUS | Status: DC

## 2019-08-29 NOTE — Patient Instructions (Signed)

## 2019-08-29 NOTE — Progress Notes (Signed)
Report given to PACU, vss 

## 2019-08-29 NOTE — Progress Notes (Signed)
Called to room to assist during endoscopic procedure.  Patient ID and intended procedure confirmed with present staff. Received instructions for my participation in the procedure from the performing physician.  

## 2019-08-29 NOTE — Op Note (Signed)
[facility] Patient Name: Michael Conway Procedure Date: 08/29/2019 7:59 AM MRN: [id number] Endoscopist: [place] William. Loletha Carrow , MD Age: 69 Referring MD:  Date of Birth: 03-Feb-1951 Gender: Male Account #: [id number] Procedure:                Colonoscopy Indications:              Surveillance: History of adenomatous polyps,                            inadequate prep on last exam (<51yr)- see recent                            office note for details. at least one adenomatous                            polyp Jan 2021 at outside practice. poor prep on                            that exam and 2014 exam. lifelong constipation Medicines:                Monitored Anesthesia Care Procedure:                Pre-Anesthesia Assessment:                           - Prior to the procedure, a History and Physical                            was performed, and patient medications and                            allergies were reviewed. The patient's tolerance of                            previous anesthesia was also reviewed. The risks                            and benefits of the procedure and the sedation                            options and risks were discussed with the patient.                            All questions were answered, and informed consent                            was obtained. Prior Anticoagulants: The patient has                            taken no previous anticoagulant or antiplatelet                            agents. ASA Grade Assessment: II - A patient with  mild systemic disease. After reviewing the risks                            and benefits, the patient was deemed in                            satisfactory condition to undergo the procedure.                           After obtaining informed consent, the colonoscope                            was passed under direct vision. Throughout the                            procedure, the patient's  blood pressure, pulse, and                            oxygen saturations were monitored continuously. The                            Colonoscope was introduced through the anus and                            advanced to the the cecum, identified by                            appendiceal orifice and ileocecal valve. The                            colonoscopy was performed with difficulty due to                            poor bowel prep, a redundant colon and significant                            looping. Successful completion of the procedure was                            aided by using manual pressure and lavage. The                            patient tolerated the procedure fairly well. The                            quality of the bowel preparation was poor. The                            ileocecal valve, appendiceal orifice, and rectum                            were photographed. The bowel preparation used was 2  day Suprep/Miralax. Scope In: 8:07:08 AM Scope Out: 8:34:57 AM Scope Withdrawal Time: 0 hours 16 minutes 56 seconds  Total Procedure Duration: 0 hours 27 minutes 49 seconds  Findings:                 The digital rectal exam findings include                            dyssynergic anorectal motilty. (pre-sedation rectal                            exam - normal resting and voluntary ST but with                            abdominal muscle contraction. valsalva produced                            voluntary sphincter contaction and abdominal wall                            muscle contraction without palpable rectal descent)                           A large amount of liquid stool was found in the                            entire colon, interfering with visualization.                            Lavage of the area was performed, resulting in                            incomplete clearance with fair visualization.                           A single  diverticulum was found in the right colon.                           The colon (entire examined portion) was                            significantly redundant resulting in scope looping.                           Melanosis was found in the entire colon.                           The exam was otherwise without abnormality on                            direct and retroflexion views. Complications:            No immediate complications. Estimated Blood Loss:     Estimated blood loss: none. Impression:               Constipation appears to be a combination of  a                            redundant colon anatomy and dyssynergic defecation.                           - Preparation of the colon was poor.                           - Dyssynergic anorectal motilty found on digital                            rectal exam.                           - Stool in the entire examined colon.                           - Diverticulosis in the right colon.                           - Redundant colon.                           - Melanosis in the colon.                           - The examination was otherwise normal on direct                            and retroflexion views.                           - No specimens collected. Recommendation:           - Patient has a contact number available for                            emergencies. The signs and symptoms of potential                            delayed complications were discussed with the                            patient. Return to normal activities tomorrow.                            Written discharge instructions were provided to the                            patient.                           - Resume previous diet.                           - Continue present medications.                           -  Repeat colonoscopy in 1 year for surveillance.                            (NEEDS 2 DAY PREP WITH ADDITIONAL LATE AM PREP DOSE                             AND MID-TO-LATE AFTERNOON PROCEDURE SLOT)                           - Perform anal manometry at appointment to be                            scheduled. Gian Ybarra L. Loletha Carrow, MD 08/29/2019 8:45:27 AM This report has been signed electronically.

## 2019-08-29 NOTE — Progress Notes (Signed)
Pt's states no medical or surgical changes since previsit or office visit. 

## 2019-08-30 ENCOUNTER — Other Ambulatory Visit: Payer: Self-pay

## 2019-08-30 DIAGNOSIS — K5909 Other constipation: Secondary | ICD-10-CM

## 2019-08-30 NOTE — Progress Notes (Signed)
Spoke with pt and he is aware of covid screen and anorectal mano appt.

## 2019-08-30 NOTE — Progress Notes (Signed)
Pt scheduled for covid screen at [facility] 11/03/19@9 :30am. Anorectal mano scheduled at [facility] 11/07/19@12 :30pm. Left message for pt to call back. Instructions printed and mailed to pt.

## 2019-08-31 ENCOUNTER — Telehealth: Payer: Self-pay | Admitting: *Deleted

## 2019-08-31 ENCOUNTER — Telehealth: Payer: Self-pay

## 2019-08-31 NOTE — Telephone Encounter (Signed)
Attempted 2nd f/u phone call. No answer. Left message.  °

## 2019-08-31 NOTE — Telephone Encounter (Signed)
Attempted to reach patient for post-procedure f/u call. No answer. Left message that we will make another attempt to reach him later today and for him to please not hesitate to call us if he has any questions/concerns regarding his care. 

## 2019-09-07 ENCOUNTER — Other Ambulatory Visit: Payer: Self-pay

## 2019-09-07 ENCOUNTER — Ambulatory Visit (INDEPENDENT_AMBULATORY_CARE_PROVIDER_SITE_OTHER): Payer: Medicare Other | Admitting: Neurology

## 2019-09-07 DIAGNOSIS — G43709 Chronic migraine without aura, not intractable, without status migrainosus: Secondary | ICD-10-CM | POA: Diagnosis not present

## 2019-09-07 MED ORDER — ONABOTULINUMTOXINA 100 UNITS IJ SOLR
175.0000 [IU] | Freq: Once | INTRAMUSCULAR | Status: AC
  Administered 2019-09-07: 155 [IU] via INTRAMUSCULAR

## 2019-09-07 NOTE — Progress Notes (Signed)
[facility]   Procedure Note Botox  Attending: Dr. Metta Clines  Preoperative Diagnosis(es): Chronic migraine  Consent obtained from: The patient. Benefits discussed included, but were not limited to decreased muscle tightness, increased joint range of motion, and decreased pain.  Risk discussed included, but were not limited pain and discomfort, bleeding, bruising, excessive weakness, venous thrombosis, muscle atrophy and dysphagia.  Anticipated outcomes of the procedure as well as he risks and benefits of the alternatives to the procedure, and the roles and tasks of the personnel to be involved, were discussed with the patient, and the patient consents to the procedure and agrees to proceed. A copy of the patient medication guide was given to the patient which explains the blackbox warning.  Patients identity and treatment sites confirmed: yes.  Details of Procedure: Skin was cleaned with alcohol. Prior to injection, the needle plunger was aspirated to make sure the needle was not within a blood vessel.  There was no blood retrieved on aspiration.    Following is a summary of the muscles injected  And the amount of Botulinum toxin used:  Dilution 200 units of Botox was reconstituted with 4 ml of preservative free normal saline. Time of reconstitution: At the time of the office visit (<30 minutes prior to injection)   Injections  155 total units of Botox was injected with a 30 gauge needle.  Injection Sites: L occipitalis: 15 units- 3 sites  R occiptalis: 15 units- 3 sites  L upper trapezius: 15 units- 3 sites R upper trapezius: 15 units- 3 sits          L paraspinal: 10 units- 2 sites R paraspinal: 10 units- 2 sites  Face L frontalis(2 injection sites):10 units   R frontalis(2 injection sites):10 units         L corrugator: 5 units   R corrugator: 5 units           Procerus: 5 units   L temporalis: 20 units R temporalis: 20 units   Agent:  200 units of botulinum Type  A (Onobotulinum Toxin type A) was reconstituted with 4 ml of preservative free normal saline.  Time of reconstitution: At the time of the office visit (<30 minutes prior to injection)     Total injected (Units): 155  Total wasted (Units): 20  Patient tolerated procedure well without complications.   Reinjection is anticipated in 3 months.

## 2019-09-11 ENCOUNTER — Encounter: Payer: Self-pay | Admitting: Pulmonary Disease

## 2019-09-11 ENCOUNTER — Ambulatory Visit (INDEPENDENT_AMBULATORY_CARE_PROVIDER_SITE_OTHER): Payer: No Typology Code available for payment source | Admitting: Pulmonary Disease

## 2019-09-11 ENCOUNTER — Other Ambulatory Visit: Payer: Self-pay

## 2019-09-11 VITALS — BP 138/82 | HR 84 | Temp 98.3°F | Ht 71.0 in | Wt 278.4 lb

## 2019-09-11 DIAGNOSIS — G4733 Obstructive sleep apnea (adult) (pediatric): Secondary | ICD-10-CM | POA: Diagnosis not present

## 2019-09-11 NOTE — Progress Notes (Signed)
Michael Conway    [id number]    [birth year]  Primary Care Physician:Crawford, Real Cons, MD  Referring Physician: [facility] [address] [place],  [place] [id number]  Chief complaint:   History of sleep apnea  HPI:  Patient recently had a sleep study performed Home sleep test which revealed obstructive sleep apnea, unaware of severity  He requires an in lab study for treatment  Usually goes to bed between 10 and 11 Falls asleep quickly 2-5 awakenings Final awakening time between 5 and 6 AM  weight is down about 25 pounds  Admits to occasional dryness of his mouth in the mornings Suffers from migraine headaches -Treatment seems to be helping recently  His dad had obstructive sleep apnea  Memory is poor  Reformed smoker-quit 2002  Worked in sales  Outpatient Encounter Medications as of 09/11/2019  Medication Sig  . Cholecalciferol (VITAMIN D3) 1.25 MG (50000 UT) TABS Take 1.25 mg by mouth once a week.  . folic acid (FOLVITE) 1 MG tablet Take 1 tablet by mouth daily.  Marland Kitchen MAGNESIUM PO Take 500 mg by mouth daily.   . Multiple Vitamin (MULTIVITAMIN) tablet Take 1 tablet by mouth daily.  . OnabotulinumtoxinA (BOTOX IJ) Inject as directed as needed.  . rizatriptan (MAXALT) 10 MG tablet Take 1 tablet earliest onset of migraine.  May repeat in 2 hours if needed.  Maximum 2 tablets in 24 hours  . zinc gluconate 50 MG tablet Take 50 mg by mouth daily.  Marland Kitchen terbinafine (LAMISIL) 250 MG tablet Take 1 tablet by mouth as needed.   No facility-administered encounter medications on file as of 09/11/2019.    Allergies as of 09/11/2019 - Review Complete 09/11/2019  Allergen Reaction Noted  . Peppermint oil Shortness Of Breath 12/01/2017    Past Medical History:  Diagnosis Date  . Colon polyps   . Depression   . Hearing impaired person, bilateral   . Hypertension   . Low testosterone   . Migraines   . Vestibular migraine     Past Surgical History:    Procedure Laterality Date  . BICEPS TENDON REPAIR Left   . INNER EAR SURGERY Right   . KNEE ARTHROSCOPY Left   . ROTATOR CUFF REPAIR Bilateral   . TYMPANOSTOMY TUBE PLACEMENT      Family History  Problem Relation Age of Onset  . Prostate cancer Other   . Heart attack Mother 35  . Heart failure Mother   . Hypertension Mother   . Dementia Father 26  . Parkinsonism Father   . Lung cancer Sister   . Lung disease Sister   . Hypertension Sister     Social History   Socioeconomic History  . Marital status: Single    Spouse name: Not on file  . Number of children: 2  . Years of education: Not on file  . Highest education level: Bachelor's degree (e.g., BA, AB, BS)  Occupational History  . Occupation: retired  Tobacco Use  . Smoking status: Former Smoker    Types: Cigarettes    Quit date: 2002    Years since quitting: 19.4  . Smokeless tobacco: Never Used  Vaping Use  . Vaping Use: Never used  Substance and Sexual Activity  . Alcohol use: Yes    Comment: occasionally  . Drug use: No  . Sexual activity: Yes    Partners: Female  Other Topics Concern  . Not on file  Social History Narrative  Patient is right-handed. He lives in a single level home. He drinks 3 cups of coffee a day and rare tea or soda. He walks daily.       Education - college grad   Social Determinants of Radio broadcast assistant Strain:   . Difficulty of Paying Living Expenses:   Food Insecurity:   . Worried About Charity fundraiser in the Last Year:   . Arboriculturist in the Last Year:   Transportation Needs:   . Film/video editor (Medical):   Marland Kitchen Lack of Transportation (Non-Medical):   Physical Activity:   . Days of Exercise per Week:   . Minutes of Exercise per Session:   Stress:   . Feeling of Stress :   Social Connections:   . Frequency of Communication with Friends and Family:   . Frequency of Social Gatherings with Friends and Family:   . Attends Religious Services:   .  Active Member of Clubs or Organizations:   . Attends Archivist Meetings:   Marland Kitchen Marital Status:   Intimate Partner Violence:   . Fear of Current or Ex-Partner:   . Emotionally Abused:   Marland Kitchen Physically Abused:   . Sexually Abused:     Review of Systems  Constitutional: Positive for fatigue.  Respiratory: Positive for apnea.   Neurological: Positive for headaches.  Psychiatric/Behavioral: Positive for sleep disturbance.    Vitals:   09/11/19 1511  BP: 138/82  Pulse: 84  Temp: 98.3 F (36.8 C)  SpO2: 98%     Physical Exam Constitutional:      Appearance: He is obese.  HENT:     Head: Normocephalic.     Mouth/Throat:     Mouth: Mucous membranes are moist.  Eyes:     General: No scleral icterus.       Right eye: No discharge.        Left eye: No discharge.     Pupils: Pupils are equal, round, and reactive to light.  Cardiovascular:     Rate and Rhythm: Normal rate and regular rhythm.     Pulses: Normal pulses.     Heart sounds: No murmur heard.  No friction rub.  Pulmonary:     Effort: Pulmonary effort is normal. No respiratory distress.     Breath sounds: No stridor. No wheezing, rhonchi or rales.  Musculoskeletal:     Cervical back: No rigidity or tenderness.  Neurological:     General: No focal deficit present.     Mental Status: He is alert.  Psychiatric:        Mood and Affect: Mood normal.    Results of the Epworth flowsheet 09/11/2019  Sitting and reading 3  Watching TV 2  Sitting, inactive in a public place (e.g. a theatre or a meeting) 3  As a passenger in a car for an hour without a break 3  Lying down to rest in the afternoon when circumstances permit 3  Sitting and talking to someone 2  Sitting quietly after a lunch without alcohol 3  In a car, while stopped for a few minutes in traffic 1  Total score 20    Assessment:  Excessive daytime sleepiness -This is likely related to untreated sleep disordered breathing  Nonrestorative  sleep -Multiple awakenings relating to sleep disordered breathing  Diagnosis of obstructive sleep apnea on recent home sleep testing -Severity is unclear at present  Pathophysiology of sleep disordered breathing discussed  Plan/Recommendations: We will  schedule the patient for split-night study for the diagnosis and treatment of sleep disordered breathing  Weight loss efforts will be beneficial  Risks of not treating sleep disordered breathing discussed  Follow-up in 2 to 3 months  Call with significant concerns  Sherrilyn Rist MD [facility] 09/11/2019, 3:32 PM  CC: [place]

## 2019-09-11 NOTE — Patient Instructions (Signed)
Obstructive sleep apnea Noted on recent sleep study  Requires an in lab titration  We will schedule you for split-night study in lab  I will follow-up with you in about 2 to 3 months, this follow-up will be to check up on how you are tolerating CPAP and if any changes need made   Sleep Apnea Sleep apnea is a condition in which breathing pauses or becomes shallow during sleep. Episodes of sleep apnea usually last 10 seconds or longer, and they may occur as many as 20 times an hour. Sleep apnea disrupts your sleep and keeps your body from getting the rest that it needs. This condition can increase your risk of certain health problems, including:  Heart attack.  Stroke.  Obesity.  Diabetes.  Heart failure.  Irregular heartbeat. What are the causes? There are three kinds of sleep apnea:  Obstructive sleep apnea. This kind is caused by a blocked or collapsed airway.  Central sleep apnea. This kind happens when the part of the brain that controls breathing does not send the correct signals to the muscles that control breathing.  Mixed sleep apnea. This is a combination of obstructive and central sleep apnea. The most common cause of this condition is a collapsed or blocked airway. An airway can collapse or become blocked if:  Your throat muscles are abnormally relaxed.  Your tongue and tonsils are larger than normal.  You are overweight.  Your airway is smaller than normal. What increases the risk? You are more likely to develop this condition if you:  Are overweight.  Smoke.  Have a smaller than normal airway.  Are elderly.  Are male.  Drink alcohol.  Take sedatives or tranquilizers.  Have a family history of sleep apnea. What are the signs or symptoms? Symptoms of this condition include:  Trouble staying asleep.  Daytime sleepiness and tiredness.  Irritability.  Loud snoring.  Morning headaches.  Trouble concentrating.  Forgetfulness.  Decreased  interest in sex.  Unexplained sleepiness.  Mood swings.  Personality changes.  Feelings of depression.  Waking up often during the night to urinate.  Dry mouth.  Sore throat. How is this diagnosed? This condition may be diagnosed with:  A medical history.  A physical exam.  A series of tests that are done while you are sleeping (sleep study). These tests are usually done in a sleep lab, but they may also be done at home. How is this treated? Treatment for this condition aims to restore normal breathing and to ease symptoms during sleep. It may involve managing health issues that can affect breathing, such as high blood pressure or obesity. Treatment may include:  Sleeping on your side.  Using a decongestant if you have nasal congestion.  Avoiding the use of depressants, including alcohol, sedatives, and narcotics.  Losing weight if you are overweight.  Making changes to your diet.  Quitting smoking.  Using a device to open your airway while you sleep, such as: ? An oral appliance. This is a custom-made mouthpiece that shifts your lower jaw forward. ? A continuous positive airway pressure (CPAP) device. This device blows air through a mask when you breathe out (exhale). ? A nasal expiratory positive airway pressure (EPAP) device. This device has valves that you put into each nostril. ? A bi-level positive airway pressure (BPAP) device. This device blows air through a mask when you breathe in (inhale) and breathe out (exhale).  Having surgery if other treatments do not work. During surgery, excess tissue is  removed to create a wider airway. It is important to get treatment for sleep apnea. Without treatment, this condition can lead to:  High blood pressure.  Coronary artery disease.  In men, an inability to achieve or maintain an erection (impotence).  Reduced thinking abilities. Follow these instructions at home: Lifestyle  Make any lifestyle changes that your  health care provider recommends.  Eat a healthy, well-balanced diet.  Take steps to lose weight if you are overweight.  Avoid using depressants, including alcohol, sedatives, and narcotics.  Do not use any products that contain nicotine or tobacco, such as cigarettes, e-cigarettes, and chewing tobacco. If you need help quitting, ask your health care provider. General instructions  Take over-the-counter and prescription medicines only as told by your health care provider.  If you were given a device to open your airway while you sleep, use it only as told by your health care provider.  If you are having surgery, make sure to tell your health care provider you have sleep apnea. You may need to bring your device with you.  Keep all follow-up visits as told by your health care provider. This is important. Contact a health care provider if:  The device that you received to open your airway during sleep is uncomfortable or does not seem to be working.  Your symptoms do not improve.  Your symptoms get worse. Get help right away if:  You develop: ? Chest pain. ? Shortness of breath. ? Discomfort in your back, arms, or stomach.  You have: ? Trouble speaking. ? Weakness on one side of your body. ? Drooping in your face. These symptoms may represent a serious problem that is an emergency. Do not wait to see if the symptoms will go away. Get medical help right away. Call your local emergency services (911 in the [place] Do not drive yourself to the hospital. Summary  Sleep apnea is a condition in which breathing pauses or becomes shallow during sleep.  The most common cause is a collapsed or blocked airway.  The goal of treatment is to restore normal breathing and to ease symptoms during sleep. This information is not intended to replace advice given to you by your health care provider. Make sure you discuss any questions you have with your health care provider. Document Revised:  08/30/2018 Document Reviewed: 11/08/2017 Elsevier Patient Education  [place].

## 2019-09-21 ENCOUNTER — Telehealth: Payer: Self-pay | Admitting: Pulmonary Disease

## 2019-09-21 NOTE — Telephone Encounter (Signed)
Pt returning a phone call. He stated that it was ok with his records being sent over to the [place]. Pt can be reached at [phone].

## 2019-09-21 NOTE — Telephone Encounter (Signed)
Patient contacted and permission given to [facility] office visit with Dr. Ander Slade. Paperwork faxed on 09/21/2019.

## 2019-09-21 NOTE — Telephone Encounter (Signed)
Called patient to get his permission to fax his records to the [place]. Waiting for call.

## 2019-09-24 ENCOUNTER — Other Ambulatory Visit (HOSPITAL_COMMUNITY)
Admission: RE | Admit: 2019-09-24 | Discharge: 2019-09-24 | Disposition: A | Discharge status: Home / Self Care | Payer: Medicare Other | Source: Ambulatory Visit | Attending: Pulmonary Disease | Admitting: Pulmonary Disease

## 2019-09-24 DIAGNOSIS — Z01812 Encounter for preprocedural laboratory examination: Secondary | ICD-10-CM | POA: Diagnosis present

## 2019-09-24 DIAGNOSIS — Z20822 Contact with and (suspected) exposure to covid-19: Secondary | ICD-10-CM | POA: Insufficient documentation

## 2019-09-24 LAB — SARS CORONAVIRUS 2 (TAT 6-24 HRS): SARS Coronavirus 2: NEGATIVE

## 2019-09-26 ENCOUNTER — Other Ambulatory Visit: Payer: Self-pay

## 2019-09-26 ENCOUNTER — Ambulatory Visit (HOSPITAL_BASED_OUTPATIENT_CLINIC_OR_DEPARTMENT_OTHER): Payer: Medicare Other | Attending: Pulmonary Disease | Admitting: Pulmonary Disease

## 2019-09-26 DIAGNOSIS — E669 Obesity, unspecified: Secondary | ICD-10-CM | POA: Diagnosis not present

## 2019-09-26 DIAGNOSIS — I493 Ventricular premature depolarization: Secondary | ICD-10-CM | POA: Insufficient documentation

## 2019-09-26 DIAGNOSIS — Z6837 Body mass index (BMI) 37.0-37.9, adult: Secondary | ICD-10-CM | POA: Insufficient documentation

## 2019-09-26 DIAGNOSIS — G4709 Other insomnia: Secondary | ICD-10-CM | POA: Diagnosis not present

## 2019-09-26 DIAGNOSIS — G4733 Obstructive sleep apnea (adult) (pediatric): Secondary | ICD-10-CM | POA: Insufficient documentation

## 2019-09-27 ENCOUNTER — Encounter (HOSPITAL_BASED_OUTPATIENT_CLINIC_OR_DEPARTMENT_OTHER): Payer: Self-pay | Admitting: Pulmonary Disease

## 2019-09-27 DIAGNOSIS — G4733 Obstructive sleep apnea (adult) (pediatric): Secondary | ICD-10-CM

## 2019-09-27 HISTORY — DX: Obstructive sleep apnea (adult) (pediatric): G47.33

## 2019-10-01 ENCOUNTER — Telehealth: Payer: Self-pay | Admitting: Pulmonary Disease

## 2019-10-01 DIAGNOSIS — G4733 Obstructive sleep apnea (adult) (pediatric): Secondary | ICD-10-CM

## 2019-10-01 NOTE — Procedures (Signed)
POLYSOMNOGRAPHY  Last, First: Michael, Conway MRN: [id number] Gender: Male Age (years): 81 Weight (lbs): 53 DOB: 05-25-1950 BMI: 37 Primary Care: No PCP Epworth Score: 15 Referring: Laurin Coder MD Technician: Carolin Coy Interpreting: Laurin Coder MD Study Type: NPSG Ordered Study Type: Split Night CPAP Study date: 09/26/2019 Location: [facility] CLINICAL INFORMATION Michael Conway is a 69 year old Male and was referred to the sleep center for evaluation of G47.33 OSA: Adult and Pediatric (327.23). Indications include Fatigue, Morning Headaches, Obesity, Snoring.  MEDICATIONS Patient self administered medications include: N/A. Medications administered during study include No sleep medicine administered.  SLEEP STUDY TECHNIQUE A multi-channel overnight Polysomnography study was performed. The channels recorded and monitored were central and occipital EEG, electrooculogram (EOG), submentalis EMG (chin), nasal and oral airflow, thoracic and abdominal wall motion, anterior tibialis EMG, snore microphone, electrocardiogram, and a pulse oximetry. TECHNICIAN COMMENTS Comments added by Technician: PATIENT WAS ORDERED AS A SPLIT NIGHT STUDY. Comments added by Scorer: N/A SLEEP ARCHITECTURE The study was initiated at 10:09:17 PM and terminated at 4:30:40 AM. The total recorded time was 381.4 minutes. EEG confirmed total sleep time was 162.5 minutes yielding a sleep efficiency of 42.6%%. Sleep onset after lights out was 42.6 minutes with a REM latency of N/A minutes. The patient spent 24.9%% of the night in stage N1 sleep, 75.1%% in stage N2 sleep, 0.0%% in stage N3 and 0% in REM. Wake after sleep onset (WASO) was 176.3 minutes. The Arousal Index was 82.7/hour. RESPIRATORY PARAMETERS There were a total of 68 respiratory disturbances out of which 6 were apneas ( 5 obstructive, 0 mixed, 1 central) and 62 hypopneas. The apnea/hypopnea index (AHI) was 25.1 events/hour. The central  sleep apnea index was 0.4 events/hour. The REM AHI was N/A events/hour and NREM AHI was 25.1 events/hour. The supine AHI was 74.8 events/hour and the non supine AHI was 11.7 supine during 21.23% of sleep. Respiratory disturbances were associated with oxygen desaturation down to a nadir of 89.0% during sleep. The mean oxygen saturation during the study was 94.1%. The cumulative time under 88% oxygen saturation was 5.5 minutes.  LEG MOVEMENT DATA The total leg movements were 94 with a resulting leg movement index of 34.7/hr .Associated arousal with leg movement index was 0.4/hr.  CARDIAC DATA The underlying cardiac rhythm was most consistent with sinus rhythm. Mean heart rate during sleep was 62.3 bpm. Additional rhythm abnormalities include PVCs.   IMPRESSIONS - Moderate Obstructive Sleep apnea(OSA), did not meet split night study criteria - Sleep maintenance insomnia - Electrocardiographic data showed presence of PVCs. - Mild Oxygen Desaturation - The patient snored with loud snoring volume. - No significant periodic leg movements(PLMs) during sleep. However, no significant associated arousals.   DIAGNOSIS - Moderate obstructive Sleep Apnea (327.23 [G47.33 ICD-10]) - Sleep maintenance insomnia   RECOMMENDATIONS - Therapeutic CPAP titration to determine optimal pressure required to alleviate sleep disordered breathing. - Auto titrating CPAP may be considered as an option treatment, CPAP setting of 5 to 15 with close clinical follow-up - Patient was fitted with a fisher and Paykel nasal/oral full face mask, large size - Positional therapy avoiding supine position during sleep. - Avoid alcohol, sedatives and other CNS depressants that may worsen sleep apnea and disrupt normal sleep architecture. - Sleep hygiene should be reviewed to assess factors that may improve sleep quality. - Weight management and regular exercise should be initiated or continued.  [Electronically signed] 10/01/2019  06:53 AM  Sherrilyn Rist MD NPI: [id number]

## 2019-10-01 NOTE — Telephone Encounter (Signed)
Call patient  Sleep study result  Date of study: 09/26/2019  Impression: Moderate obstructive sleep apnea Sleep maintenance insomnia  Recommendation: - Therapeutic CPAP titration to determine optimal pressure required to alleviate sleep disordered breathing. - Auto titrating CPAP may be considered as an option treatment, CPAP setting of 5 to 15 with heated humidification, with close clinical follow-up - Patient was fitted with a fisher and Paykel nasal/oral full face mask, large size   PS: Patient is from the [place] Not sure whether they will cover auto titrating CPAP without a titration  If auto CPAP will not be covered, a titration study should be ordered.

## 2019-10-03 NOTE — Telephone Encounter (Signed)
Patient called with results of home sleep study. Placed order to DME for CPAP. Please see Dr. Judson Roch note regarding [facility] approval.

## 2019-10-03 NOTE — Telephone Encounter (Signed)
Michael Conway, ok to order under your name?

## 2019-10-03 NOTE — Telephone Encounter (Signed)
Pt called back, wanted to add that Wyn Quaker (because he is a listed community provider by the [place]) needs to write a prescription for cpap and give it to pt so he can take it to the supplier approved by [place]. Please advise. Call pt back with questions or to discuss [phone]

## 2019-10-04 NOTE — Telephone Encounter (Signed)
Spoke with the pt and notified of recs per Aaron Edelman and he verbalized understanding. Nothing further needed.

## 2019-10-04 NOTE — Telephone Encounter (Signed)
If the recommendation is from Dr. Ander Slade, I am not understanding why it needs to be placed under my name?  I would recommend that the order be placed under Dr. Judson Roch name as he is the one giving the order.  Wyn Quaker, FNP

## 2019-10-13 ENCOUNTER — Other Ambulatory Visit (HOSPITAL_COMMUNITY): Payer: No Typology Code available for payment source

## 2019-10-15 ENCOUNTER — Telehealth: Payer: Self-pay | Admitting: Gastroenterology

## 2019-10-15 ENCOUNTER — Encounter (HOSPITAL_BASED_OUTPATIENT_CLINIC_OR_DEPARTMENT_OTHER): Payer: No Typology Code available for payment source | Admitting: Pulmonary Disease

## 2019-10-15 NOTE — Telephone Encounter (Signed)
Pts EM rescheduled at [facility] for 12/26/19@8 :30am, covid screen moved to 9/25@9 :30am. Pt aware and updated instructions sent to pt via mychart.

## 2019-10-15 NOTE — Telephone Encounter (Signed)
Pts needs to r/s manometry scheduled at [facility] on 8/11. Pls call him.

## 2019-10-24 ENCOUNTER — Encounter (HOSPITAL_COMMUNITY): Payer: Self-pay

## 2019-10-24 NOTE — Progress Notes (Signed)
Sent a message in [place] informing of the covid drive-thru location change.

## 2019-10-25 NOTE — Progress Notes (Signed)
NEUROLOGY FOLLOW UP OFFICE NOTE  Michael Conway [id number]  HISTORY OF PRESENT ILLNESS: Michael Conway a67 year oldright-handed Caucasian man whofollows up for migraines and migraine-associated vertigo. 17-19 pre UPDATE: He is status post 2 rounds of Botox. Significant migraines: 5-10/10, 10 days Vertigo: Daily Tinnitus: daily Falls: no falls  Current NSAIDS:Aleve (every other day) Current triptans:rizatriptan 19m (rarely takes it.  Reluctant to take it) Other current therapy:  Botox  Current vitamins/supplements: Magnesium 5627OJ Zinc, D, folic acid  Coffee: Daily Exercise: Walks, lost 37 lbs since January Sleep hygiene: Diagnosed with moderate OSA.  Started using CPAP last week.  It is difficult to use.  Depression: yes  HISTORY: Since the 175s he hasmigraines andprogressive bilateral sensorineural hearing loss with tinnitusof probable autoimmune etiology.Symptoms worse after exposure to loud explosions[place] a shooting [place] in [place].Repeated audiometric testing has demonstrated progressive high frequency sensorineural hearing loss with continued loss of discrimination in both ears.Autoimmune workup, including ANA, Sed Rate, RF, complement protein levels, HLA-B27, anitphospholipid antibodies, 68 Kd antibody titers, type II collagen antibodies, hepatitis B and C titers and RPR, were unremarkable. Methotrexate, high-dose oral steroids and intratympanic dexamethasone were ineffective. [facility] without contrast [facility] on [facility]17/17 which was unremarkable, including CN VII and VIII and cerebellopontine angle. He was subsequently diagnosed with vestibular migraines. He reports daily headaches (at least a constant 2/10),tinnitus, and vertigo. Headaches are mild to severe posterior neck stiffness to occipital pressure but when more severe become right frontal and sometimes  bilateral pounding. He is able to still function during the migraines. Severe headaches last a few hours. Hot weather or storms triggers them.[facility] Coe  Abnormal vestibular exam was consistent with peripheral vestibular hypofunction affecting the left ear (Bithermal air caloric shows a 33% left unilateral weakness, video Head Impulse Test abnormal with repeatable corrective saccades following leftward head impulse. Vertigo is likely associated with migraines and believed to not be well-treated until migraines are controlled.  He also notes tingling in the anterior thighs occurring once a week, lasting maybe an hour. Denies back pain, radicular pain or leg weakness.He underwent a neuropathy workup. TSH was 3.58. B12 was 350. NCV-EMG from 05/23/18 of lower extremities was normal.  Past NSAIDs: Advil Past analgesics: BC Past triptan:  Sumatriptan 564m(constipation) Past antidepressant:  Nortriptyline 255mconstipation) Past antiepileptic: Topiramate 59m60mice daily (made him more nauseous), zonisamide (constipation) Other past therapy: Vestibular rehab (ineffective)  PAST MEDICAL HISTORY: Past Medical History:  Diagnosis Date   Colon polyps    Depression    Hearing impaired person, bilateral    Hypertension    Low testosterone    Migraines    OSA (obstructive sleep apnea) 09/27/2019   Vestibular migraine     MEDICATIONS: Current Outpatient Medications on File Prior to Visit  Medication Sig Dispense Refill   Cholecalciferol (VITAMIN D3) 1.25 MG (50000 UT) TABS Take 1.25 mg by mouth once a week.     folic acid (FOLVITE) 1 MG tablet Take 1 tablet by mouth daily.     MAGNESIUM PO Take 500 mg by mouth daily.      Multiple Vitamin (MULTIVITAMIN) tablet Take 1 tablet by mouth daily.     OnabotulinumtoxinA (BOTOX IJ) Inject as directed as needed.     rizatriptan (MAXALT) 10 MG tablet Take  1 tablet earliest onset of migraine.  May repeat in  2 hours if needed.  Maximum 2 tablets in 24 hours 10 tablet 3   terbinafine (LAMISIL) 250 MG tablet Take 1 tablet by mouth as needed.     zinc gluconate 50 MG tablet Take 50 mg by mouth daily.     No current facility-administered medications on file prior to visit.    ALLERGIES: Allergies  Allergen Reactions   Peppermint Oil Shortness Of Breath    FAMILY HISTORY: Family History  Problem Relation Age of Onset   Prostate cancer Other    Heart attack Mother 37   Heart failure Mother    Hypertension Mother    Dementia Father 47   Parkinsonism Father    Lung cancer Sister    Lung disease Sister    Hypertension Sister     SOCIAL HISTORY: Social History   Socioeconomic History   Marital status: Single    Spouse name: Not on file   Number of children: 2   Years of education: Not on file   Highest education level: Bachelor's degree (e.g., BA, AB, BS)  Occupational History   Occupation: retired  Tobacco Use   Smoking status: Former Smoker    Types: Cigarettes    Quit date: 2002    Years since quitting: 19.5   Smokeless tobacco: Never Used  Scientific laboratory technician Use: Never used  Substance and Sexual Activity   Alcohol use: Yes    Comment: occasionally   Drug use: No   Sexual activity: Yes    Partners: Female  Other Topics Concern   Not on file  Social History Narrative   Patient is right-handed. He lives in a single level home. He drinks 3 cups of coffee a day and rare tea or soda. He walks daily.       Education - college grad   Social Determinants of Radio broadcast assistant Strain:    Difficulty of Paying Living Expenses:   Food Insecurity:    Worried About Charity fundraiser in the Last Year:    Arboriculturist in the Last Year:   Transportation Needs:    Film/video editor (Medical):    Lack of Transportation (Non-Medical):   Physical Activity:    Days of Exercise  per Week:    Minutes of Exercise per Session:   Stress:    Feeling of Stress :   Social Connections:    Frequency of Communication with Friends and Family:    Frequency of Social Gatherings with Friends and Family:    Attends Religious Services:    Active Member of Clubs or Organizations:    Attends Archivist Meetings:    Marital Status:   Intimate Partner Violence:    Fear of Current or Ex-Partner:    Emotionally Abused:    Physically Abused:    Sexually Abused:     PHYSICAL EXAM: Blood pressure 124/68, pulse 69, height 5' 11"  (1.803 m), weight (!) 274 lb 12.8 oz (124.6 kg), SpO2 97 %. General: No acute distress.  Patient appears well-groomed.   Head:  Normocephalic/atraumatic Eyes:  Fundi examined but not visualized Neck: supple, no paraspinal tenderness, full range of motion Heart:  Regular rate and rhythm Lungs:  Clear to auscultation bilaterally Back: No paraspinal tenderness Neurological Exam: alert and oriented to person, place, and time. Attention span and concentration intact, recent and remote memory intact, fund of knowledge intact.  Speech fluent and not dysarthric, language intact.  Decreased hearing bilaterally.  Otherwise,  CN II-XII intact. Bulk and tone normal, muscle strength 5/5 throughout.  Sensation to light touch, temperature and vibration intact.  Deep tendon reflexes 2+ throughout, toes downgoing.  Finger to nose and heel to shin testing intact.  Gait normal, Romberg negative.  IMPRESSION: 1.  Chronic migraine without aura, without status migrainosus, not intractable 2.  Migraine-associated vertigo 3.  Left vestibular hypofunction 4.  Sensorineural hearing loss 5.  OSA With Botox, he has noted some improvement in headaches but no improvement in vertigo.  PLAN: 1.  Will continue Botox 2.  He has rizatriptan if needed 3.  He will continue using CPAP 4.  He will continue losing weight 5.  Follow up for next Botox  Metta Clines,  DO  CC: Pricilla Holm, MD

## 2019-10-26 ENCOUNTER — Other Ambulatory Visit: Payer: Self-pay

## 2019-10-26 ENCOUNTER — Encounter: Payer: Self-pay | Admitting: Neurology

## 2019-10-26 ENCOUNTER — Ambulatory Visit (INDEPENDENT_AMBULATORY_CARE_PROVIDER_SITE_OTHER): Payer: No Typology Code available for payment source | Admitting: Neurology

## 2019-10-26 VITALS — BP 124/68 | HR 69 | Ht 71.0 in | Wt 274.8 lb

## 2019-10-26 DIAGNOSIS — G43709 Chronic migraine without aura, not intractable, without status migrainosus: Secondary | ICD-10-CM | POA: Diagnosis not present

## 2019-10-26 DIAGNOSIS — G4733 Obstructive sleep apnea (adult) (pediatric): Secondary | ICD-10-CM

## 2019-10-26 DIAGNOSIS — H903 Sensorineural hearing loss, bilateral: Secondary | ICD-10-CM

## 2019-10-26 DIAGNOSIS — G43109 Migraine with aura, not intractable, without status migrainosus: Secondary | ICD-10-CM

## 2019-10-26 DIAGNOSIS — H832X2 Labyrinthine dysfunction, left ear: Secondary | ICD-10-CM

## 2019-10-26 NOTE — Patient Instructions (Signed)
Follow up for next Botox

## 2019-10-31 ENCOUNTER — Telehealth: Payer: Self-pay

## 2019-10-31 NOTE — Telephone Encounter (Signed)
[facility] forms filled out and ready for pick up. LMOVM for pt to call and pay over the phone or pay when he comes to pick up.

## 2019-10-31 NOTE — Progress Notes (Signed)
[facility] headache Disability forms filled out and ready for pick up.

## 2019-11-03 ENCOUNTER — Other Ambulatory Visit (HOSPITAL_COMMUNITY): Payer: Medicare Other

## 2019-12-04 ENCOUNTER — Telehealth: Payer: Self-pay | Admitting: Gastroenterology

## 2019-12-04 NOTE — Telephone Encounter (Signed)
FYI Dr Nandigam 

## 2019-12-04 NOTE — Progress Notes (Signed)
Patient called to cancel endo procedure due to not being able to quarantine for procedure because of work. He informed me he will call back at a later date to reschedule. Nandigam's office informed, taken off schedule.

## 2019-12-04 NOTE — Telephone Encounter (Signed)
They called from scheduling at the hospital to advise the patient canceled said he was not going to quarantine and will call back to reschedule in the fall.

## 2019-12-05 NOTE — Telephone Encounter (Signed)
ok 

## 2019-12-07 ENCOUNTER — Other Ambulatory Visit: Payer: Self-pay

## 2019-12-07 ENCOUNTER — Ambulatory Visit (INDEPENDENT_AMBULATORY_CARE_PROVIDER_SITE_OTHER): Payer: No Typology Code available for payment source | Admitting: Neurology

## 2019-12-07 DIAGNOSIS — G43709 Chronic migraine without aura, not intractable, without status migrainosus: Secondary | ICD-10-CM | POA: Diagnosis not present

## 2019-12-07 MED ORDER — ONABOTULINUMTOXINA 100 UNITS IJ SOLR
160.0000 [IU] | Freq: Once | INTRAMUSCULAR | Status: AC
  Administered 2019-12-07: 155 [IU] via INTRAMUSCULAR

## 2019-12-07 NOTE — Progress Notes (Signed)
Botulinum Clinic   Procedure Note Botox  Attending: Dr. Metta Clines  Preoperative Diagnosis(es): Chronic migraine  Consent obtained from: The patient Benefits discussed included, but were not limited to decreased muscle tightness, increased joint range of motion, and decreased pain.  Risk discussed included, but were not limited pain and discomfort, bleeding, bruising, excessive weakness, venous thrombosis, muscle atrophy and dysphagia.  Anticipated outcomes of the procedure as well as he risks and benefits of the alternatives to the procedure, and the roles and tasks of the personnel to be involved, were discussed with the patient, and the patient consents to the procedure and agrees to proceed. A copy of the patient medication guide was given to the patient which explains the blackbox warning.  Patients identity and treatment sites confirmed Yes.  .  Details of Procedure: Skin was cleaned with alcohol. Prior to injection, the needle plunger was aspirated to make sure the needle was not within a blood vessel.  There was no blood retrieved on aspiration.    Following is a summary of the muscles injected  And the amount of Botulinum toxin used:  Dilution 200 units of Botox was reconstituted with 4 ml of preservative free normal saline. Time of reconstitution: At the time of the office visit (<30 minutes prior to injection)   Injections  155 total units of Botox was injected with a 30 gauge needle.  Injection Sites: L occipitalis: 15 units- 3 sites  R occiptalis: 15 units- 3 sites  L upper trapezius: 15 units- 3 sites R upper trapezius: 15 units- 3 sits          L paraspinal: 10 units- 2 sites R paraspinal: 10 units- 2 sites  Face L frontalis(2 injection sites):10 units   R frontalis(2 injection sites):10 units         L corrugator: 5 units   R corrugator: 5 units           Procerus: 5 units   L temporalis: 20 units R temporalis: 20 units   Agent:  200 units of botulinum Type  A (Onobotulinum Toxin type A) was reconstituted with 4 ml of preservative free normal saline.  Time of reconstitution: At the time of the office visit (<30 minutes prior to injection)     Total injected (Units): 155  Total wasted (Units): 5  Patient tolerated procedure well without complications.   Reinjection is anticipated in 3 months. Return to clinic in 5 months

## 2019-12-22 ENCOUNTER — Inpatient Hospital Stay (HOSPITAL_COMMUNITY)
Admission: RE | Admit: 2019-12-22 | Discharge: 2019-12-22 | Disposition: A | Discharge status: Home / Self Care | Payer: Medicare Other | Source: Ambulatory Visit

## 2019-12-26 ENCOUNTER — Ambulatory Visit (HOSPITAL_COMMUNITY): Admit: 2019-12-26 | Payer: No Typology Code available for payment source | Admitting: Gastroenterology

## 2019-12-26 ENCOUNTER — Encounter (HOSPITAL_COMMUNITY): Payer: Self-pay

## 2019-12-26 SURGERY — MANOMETRY, ANORECTAL
Anesthesia: LOCAL

## 2020-03-07 ENCOUNTER — Ambulatory Visit (INDEPENDENT_AMBULATORY_CARE_PROVIDER_SITE_OTHER): Payer: No Typology Code available for payment source | Admitting: Neurology

## 2020-03-07 ENCOUNTER — Other Ambulatory Visit: Payer: Self-pay

## 2020-03-07 DIAGNOSIS — G43709 Chronic migraine without aura, not intractable, without status migrainosus: Secondary | ICD-10-CM | POA: Diagnosis not present

## 2020-03-07 MED ORDER — ONABOTULINUMTOXINA 100 UNITS IJ SOLR
200.0000 [IU] | Freq: Once | INTRAMUSCULAR | Status: AC
  Administered 2020-03-07: 155 [IU] via INTRAMUSCULAR

## 2020-03-07 NOTE — Progress Notes (Signed)
Botulinum Clinic   Procedure Note Botox  Attending: Dr. Metta Clines  Preoperative Diagnosis(es): Chronic migraine  Consent obtained from: The patient. Benefits discussed included, but were not limited to decreased muscle tightness, increased joint range of motion, and decreased pain.  Risk discussed included, but were not limited pain and discomfort, bleeding, bruising, excessive weakness, venous thrombosis, muscle atrophy and dysphagia.  Anticipated outcomes of the procedure as well as he risks and benefits of the alternatives to the procedure, and the roles and tasks of the personnel to be involved, were discussed with the patient, and the patient consents to the procedure and agrees to proceed. A copy of the patient medication guide was given to the patient which explains the blackbox warning.  Patients identity and treatment sites confirmed:  yes  Details of Procedure: Skin was cleaned with alcohol. Prior to injection, the needle plunger was aspirated to make sure the needle was not within a blood vessel.  There was no blood retrieved on aspiration.    Following is a summary of the muscles injected  And the amount of Botulinum toxin used:  Dilution 200 units of Botox was reconstituted with 4 ml of preservative free normal saline. Time of reconstitution: At the time of the office visit (<30 minutes prior to injection)   Injections  155 total units of Botox was injected with a 30 gauge needle.  Injection Sites: L occipitalis: 15 units- 3 sites  R occiptalis: 15 units- 3 sites  L upper trapezius: 15 units- 3 sites R upper trapezius: 15 units- 3 sits          L paraspinal: 10 units- 2 sites R paraspinal: 10 units- 2 sites  Face L frontalis(2 injection sites):10 units   R frontalis(2 injection sites):10 units         L corrugator: 5 units   R corrugator: 5 units           Procerus: 5 units   L temporalis: 20 units R temporalis: 20 units   Agent:  200 units of botulinum Type  A (Onobotulinum Toxin type A) was reconstituted with 4 ml of preservative free normal saline.  Time of reconstitution: At the time of the office visit (<30 minutes prior to injection)     Total injected (Units): 155  Total wasted (Units): 45  Patient tolerated procedure well without complications.   Reinjection is anticipated in 3 months.

## 2020-04-07 DIAGNOSIS — L720 Epidermal cyst: Secondary | ICD-10-CM | POA: Diagnosis not present

## 2020-04-07 DIAGNOSIS — L218 Other seborrheic dermatitis: Secondary | ICD-10-CM | POA: Diagnosis not present

## 2020-04-10 ENCOUNTER — Ambulatory Visit: Payer: Medicare Other | Admitting: Internal Medicine

## 2020-04-10 DIAGNOSIS — L720 Epidermal cyst: Secondary | ICD-10-CM | POA: Diagnosis not present

## 2020-04-10 DIAGNOSIS — L218 Other seborrheic dermatitis: Secondary | ICD-10-CM | POA: Diagnosis not present

## 2020-04-28 NOTE — Progress Notes (Signed)
Virtual Visit via Video Note The purpose of this virtual visit is to provide medical care while limiting exposure to the novel coronavirus.    Consent was obtained for video visit:  Yes.   Answered questions that patient had about telehealth interaction:  Yes.   I discussed the limitations, risks, security and privacy concerns of performing an evaluation and management service by telemedicine. I also discussed with the patient that there may be a patient responsible charge related to this service. The patient expressed understanding and agreed to proceed.  Pt location: Home Physician Location: office Name of referring provider:  Hoyt Conway, * I connected with Michael Conway at patients initiation/request on 04/30/2020 at 10:10 AM EST by video enabled telemedicine application and verified that I am speaking with the correct person using two identifiers. Pt MRN:  [id number] Pt DOB:  05/09/1950 Video Participants:  Michael Conway   History of Present Illness:  Michael McGregoris a43 year oldright-handed Caucasian man whofollows up for migraines and vertigo  UPDATE: Started acupuncture. Significant migraines: 5-10/10,10 days Vertigo: Daily Tinnitus:daily Falls:no falls  Current NSAIDS:Aleve (every other day) Current triptans:rizatriptan 25m (rarely takes it) Other current therapy:  Botox, acupuncture Current vitamins/supplements: Magnesium 5462VO Zinc, D, folic acid  Coffee: Daily Exercise: Walk Sleep hygiene: Diagnosed with moderate OSA.  On CPAP but struggles with it.  Depression: yes  HISTORY: Since the 174s he hasmigraines andprogressive bilateral sensorineural hearing loss with tinnitusof probable autoimmune etiology.Symptoms worse after exposure to loud explosionsat a shooting range in July 2019.Repeated audiometric testing has demonstrated progressive high frequency sensorineural hearing loss with continued loss of  discrimination in both ears.Autoimmune workup, including ANA, Sed Rate, RF, complement protein levels, HLA-B27, anitphospholipid antibodies, 68 Kd antibody titers, type II collagen antibodies, hepatitis B and C titers and RPR, were unremarkable. Methotrexate, high-dose oral steroids and intratympanic dexamethasone were ineffective. [facility] canals without contrast [facility] performed on 12/17/17 which was unremarkable, including CN VII and VIII and cerebellopontine angle. He was subsequently diagnosed with vestibular migraines. He reports daily headaches (at least a constant 2/10),tinnitus, and vertigo. Headaches are mild to severe posterior neck stiffness to occipital pressure but when more severe become right frontal and sometimes bilateral pounding. He is able to still function during the migraines. Severe headaches last a few hours. Severe migraines occur 19-20 days a month.  Hot weather or storms triggers them.[facility] K[facility]ado Coe Abnormal vestibular Conway was consistent with peripheral vestibular hypofunction affecting the left ear (Bithermal air caloric shows a 33% left unilateral weakness, video Head Impulse Test abnormal with repeatable corrective saccades following leftward head impulse. Vertigo is likely associated with migraines and believed to not be well-treated until migraines are controlled.  He also notes tingling in the anterior thighs occurring once a week, lasting maybe an hour. Denies back pain, radicular pain or leg weakness.He underwent a neuropathy workup. TSH was 3.58. B12 was 350. NCV-EMG from 05/23/18 of lower extremities was normal.  Past NSAIDs: Advil Past analgesics: BC Past triptan:  Sumatriptan 56m(constipation) Past antidepressant: Nortriptyline 2518mconstipation) Past antiepileptic: Topiramate 53m30mice daily (made him more  nauseous), zonisamide (constipation) Other past therapy: Vestibular rehab (ineffective)   Past Medical History: Past Medical History:  Diagnosis Date  . Colon polyps   . Depression   . Hearing impaired person, bilateral   . Hypertension   . Low  testosterone   . Migraines   . OSA (obstructive sleep apnea) 09/27/2019  . Vestibular migraine     Medications: Outpatient Encounter Medications as of 04/30/2020  Medication Sig  . Cholecalciferol (VITAMIN D3) 1.25 MG (50000 UT) TABS Take 1.25 mg by mouth once a week.  . folic acid (FOLVITE) 1 MG tablet Take 1 tablet by mouth daily.  Marland Kitchen MAGNESIUM PO Take 500 mg by mouth daily.   . Multiple Vitamin (MULTIVITAMIN) tablet Take 1 tablet by mouth daily.  . OnabotulinumtoxinA (BOTOX IJ) Inject as directed as needed.  . rizatriptan (MAXALT) 10 MG tablet Take 1 tablet earliest onset of migraine.  May repeat in 2 hours if needed.  Maximum 2 tablets in 24 hours  . zinc gluconate 50 MG tablet Take 50 mg by mouth daily.   No facility-administered encounter medications on file as of 04/30/2020.    Allergies: Allergies  Allergen Reactions  . Peppermint Oil Shortness Of Breath    Family History: Family History  Problem Relation Age of Onset  . Prostate cancer Other   . Heart attack Mother 51  . Heart failure Mother   . Hypertension Mother   . Dementia Father 27  . Parkinsonism Father   . Lung cancer Sister   . Lung disease Sister   . Hypertension Sister     Social History: Social History   Socioeconomic History  . Marital status: Single    Spouse name: Not on file  . Number of children: 2  . Years of education: Not on file  . Highest education level: Bachelor's degree (e.g., BA, AB, BS)  Occupational History  . Occupation: retired  Tobacco Use  . Smoking status: Former Smoker    Types: Cigarettes    Quit date: 2002    Years since quitting: 20.1  . Smokeless tobacco: Never Used  Vaping Use  . Vaping Use: Never used  Substance  and Sexual Activity  . Alcohol use: Yes    Comment: occasionally  . Drug use: No  . Sexual activity: Yes    Partners: Female  Other Topics Concern  . Not on file  Social History Narrative   Patient is right-handed. He lives in a single level home. He drinks 3 cups of coffee a day and rare tea or soda. He walks daily.       Education - college grad   Social Determinants of Radio broadcast assistant Strain: Not on file  Food Insecurity: Not on file  Transportation Needs: Not on file  Physical Activity: Not on file  Stress: Not on file  Social Connections: Not on file  Intimate Partner Violence: Not on file    Observations/Objective:   There were no vitals taken for this visit. No acute distress.  Alert and oriented.  Speech fluent and not dysarthric.  Language intact.   Assessment and Plan:   1. Migraine without aura, without status migrainosus, not intractable/vestibular migraine 2.  Vertigo 3.  Left vestibular hypofunction 4.  Sensorineural hearing loss 5.  OSA  He is doing well on Botox - accomplished 50% reduction in migraine frequency.  However, I think he may benefit further reduced frequency adding on a CGRP inhibitor.  Unsure if [employer] would approve this, however.  1.  For migraine prevention:  Botox 2.  For migraine rescue:  Rizatriptan $RemoveBefo'10mg'[id number]$  3.  Limit use of pain relievers to no more than 2 days out of week to prevent risk of rebound or medication-overuse headache. 4.  Keep  headache diary 5.  He will contact his insurance to see if they would approve adding a CGRP inhibitor in addition to Botox, and how much it would cost him out of pocket.   6.  Follow up for next round of Botox.  Follow Up Instructions:    -I discussed the assessment and treatment plan with the patient. The patient was provided an opportunity to ask questions and all were answered. The patient agreed with the plan and demonstrated an understanding of the instructions.   The patient was  advised to call back or seek an in-person evaluation if the symptoms worsen or if the condition fails to improve as anticipated.     Dudley Major, DO

## 2020-04-30 ENCOUNTER — Encounter: Payer: Self-pay | Admitting: Neurology

## 2020-04-30 ENCOUNTER — Telehealth (INDEPENDENT_AMBULATORY_CARE_PROVIDER_SITE_OTHER): Payer: Medicare Other | Admitting: Neurology

## 2020-04-30 ENCOUNTER — Other Ambulatory Visit: Payer: Self-pay

## 2020-04-30 DIAGNOSIS — G43809 Other migraine, not intractable, without status migrainosus: Secondary | ICD-10-CM | POA: Diagnosis not present

## 2020-04-30 DIAGNOSIS — H903 Sensorineural hearing loss, bilateral: Secondary | ICD-10-CM

## 2020-04-30 DIAGNOSIS — H832X2 Labyrinthine dysfunction, left ear: Secondary | ICD-10-CM

## 2020-04-30 DIAGNOSIS — G4733 Obstructive sleep apnea (adult) (pediatric): Secondary | ICD-10-CM

## 2020-04-30 DIAGNOSIS — G43709 Chronic migraine without aura, not intractable, without status migrainosus: Secondary | ICD-10-CM | POA: Diagnosis not present

## 2020-04-30 NOTE — Patient Instructions (Addendum)
Contact your insurance and ask if they would potentially approve any of the following medications in addition to the Botox.  And if approved, how much it would cost you per month.  If any of these options are reasonable, let me know:  [employer] Emgality Ajovy  I will see you next month.

## 2020-06-13 ENCOUNTER — Ambulatory Visit (INDEPENDENT_AMBULATORY_CARE_PROVIDER_SITE_OTHER): Payer: Medicare Other | Admitting: Neurology

## 2020-06-13 ENCOUNTER — Other Ambulatory Visit: Payer: Self-pay

## 2020-06-13 DIAGNOSIS — G43709 Chronic migraine without aura, not intractable, without status migrainosus: Secondary | ICD-10-CM | POA: Diagnosis not present

## 2020-06-13 MED ORDER — ONABOTULINUMTOXINA 100 UNITS IJ SOLR
180.0000 [IU] | Freq: Once | INTRAMUSCULAR | Status: AC
  Administered 2020-06-13: 155 [IU] via INTRAMUSCULAR

## 2020-06-13 NOTE — Progress Notes (Signed)
Botulinum Clinic   Procedure Note Botox  Attending: Dr. Metta Clines  Preoperative Diagnosis(es): Chronic migraine  Consent obtained from: The patient Benefits discussed included, but were not limited to decreased muscle tightness, increased joint range of motion, and decreased pain.  Risk discussed included, but were not limited pain and discomfort, bleeding, bruising, excessive weakness, venous thrombosis, muscle atrophy and dysphagia.  Anticipated outcomes of the procedure as well as he risks and benefits of the alternatives to the procedure, and the roles and tasks of the personnel to be involved, were discussed with the patient, and the patient consents to the procedure and agrees to proceed. A copy of the patient medication guide was given to the patient which explains the blackbox warning.  Patients identity and treatment sites confirmed Yes.  .  Details of Procedure: Skin was cleaned with alcohol. Prior to injection, the needle plunger was aspirated to make sure the needle was not within a blood vessel.  There was no blood retrieved on aspiration.    Following is a summary of the muscles injected  And the amount of Botulinum toxin used:  Dilution 200 units of Botox was reconstituted with 4 ml of preservative free normal saline. Time of reconstitution: At the time of the office visit (<30 minutes prior to injection)   Injections  155 total units of Botox was injected with a 30 gauge needle.  Injection Sites: L occipitalis: 15 units- 3 sites  R occiptalis: 15 units- 3 sites  L upper trapezius: 15 units- 3 sites R upper trapezius: 15 units- 3 sits          L paraspinal: 10 units- 2 sites R paraspinal: 10 units- 2 sites  Face L frontalis(2 injection sites):10 units   R frontalis(2 injection sites):10 units         L corrugator: 5 units   R corrugator: 5 units           Procerus: 5 units   L temporalis: 20 units R temporalis: 20 units   Agent:  200 units of botulinum Type  A (Onobotulinum Toxin type A) was reconstituted with 4 ml of preservative free normal saline.  Time of reconstitution: At the time of the office visit (<30 minutes prior to injection)     Total injected (Units): 155  Total wasted (Units): 25  Patient tolerated procedure well without complications.   Reinjection is anticipated in 3 months.

## 2020-06-25 ENCOUNTER — Encounter: Payer: Self-pay | Admitting: Gastroenterology

## 2020-09-01 ENCOUNTER — Other Ambulatory Visit: Payer: Self-pay

## 2020-09-01 ENCOUNTER — Ambulatory Visit (AMBULATORY_SURGERY_CENTER): Payer: Self-pay | Admitting: *Deleted

## 2020-09-01 VITALS — Ht 71.0 in | Wt 289.0 lb

## 2020-09-01 DIAGNOSIS — Z86010 Personal history of colonic polyps: Secondary | ICD-10-CM

## 2020-09-01 MED ORDER — PEG 3350-KCL-NA BICARB-NACL 420 G PO SOLR: 4000.0000 mL | Freq: Once | ORAL | 0 refills | Status: AC

## 2020-09-01 NOTE — Progress Notes (Signed)
No egg or soy allergy known to patient  No issues with past sedation with any surgeries or procedures Patient denies ever being told they had issues or difficulty with intubation  No FH of Malignant Hyperthermia No diet pills per patient No home 02 use per patient  No blood thinners per patient  Pt states issues with constipation; pt will do two days of clears, a two day golytely prep, and a bottle of mag citrate for prep. No A fib or A flutter  EMMI video to pt or via [place] 19 guidelines implemented in PV today with Pt and RN  Pt is fully vaccinated  for Covid   Due to the COVID-19 pandemic we are asking patients to follow certain guidelines.  Pt aware of COVID protocols and [facility] guidelines

## 2020-09-12 ENCOUNTER — Ambulatory Visit (INDEPENDENT_AMBULATORY_CARE_PROVIDER_SITE_OTHER): Payer: Medicare Other | Admitting: Neurology

## 2020-09-12 ENCOUNTER — Other Ambulatory Visit: Payer: Self-pay

## 2020-09-12 ENCOUNTER — Ambulatory Visit: Payer: No Typology Code available for payment source | Admitting: Neurology

## 2020-09-12 DIAGNOSIS — G43709 Chronic migraine without aura, not intractable, without status migrainosus: Secondary | ICD-10-CM

## 2020-09-12 MED ORDER — ONABOTULINUMTOXINA 100 UNITS IJ SOLR
155.0000 [IU] | Freq: Once | INTRAMUSCULAR | Status: AC
  Administered 2020-09-12: 155 [IU] via INTRAMUSCULAR

## 2020-09-12 NOTE — Progress Notes (Signed)
[facility]   Procedure Note Botox  Attending: Dr. Metta Clines  Preoperative Diagnosis(es): Chronic migraine  Consent obtained from: The patient Benefits discussed included, but were not limited to decreased muscle tightness, increased joint range of motion, and decreased pain.  Risk discussed included, but were not limited pain and discomfort, bleeding, bruising, excessive weakness, venous thrombosis, muscle atrophy and dysphagia.  Anticipated outcomes of the procedure as well as he risks and benefits of the alternatives to the procedure, and the roles and tasks of the personnel to be involved, were discussed with the patient, and the patient consents to the procedure and agrees to proceed. A copy of the patient medication guide was given to the patient which explains the blackbox warning.  Patients identity and treatment sites confirmed Yes.  .  Details of Procedure: Skin was cleaned with alcohol. Prior to injection, the needle plunger was aspirated to make sure the needle was not within a blood vessel.  There was no blood retrieved on aspiration.    Following is a summary of the muscles injected  And the amount of Botulinum toxin used:  Dilution 200 units of Botox was reconstituted with 4 ml of preservative free normal saline. Time of reconstitution: At the time of the office visit (<30 minutes prior to injection)   Injections  155 total units of Botox was injected with a 30 gauge needle.  Injection Sites: L occipitalis: 15 units- 3 sites  R occiptalis: 15 units- 3 sites  L upper trapezius: 15 units- 3 sites R upper trapezius: 15 units- 3 sits          L paraspinal: 10 units- 2 sites R paraspinal: 10 units- 2 sites  Face L frontalis(2 injection sites):10 units   R frontalis(2 injection sites):10 units         L corrugator: 5 units   R corrugator: 5 units           Procerus: 5 units   L temporalis: 20 units R temporalis: 20 units   Agent:  200 units of botulinum Type  A (Onobotulinum Toxin type A) was reconstituted with 4 ml of preservative free normal saline.  Time of reconstitution: At the time of the office visit (<30 minutes prior to injection)     Total injected (Units):  155  Total wasted (Units):  none wasted  Patient tolerated procedure well without complications.   Reinjection is anticipated in 3 months.

## 2020-09-14 ENCOUNTER — Encounter: Payer: Self-pay | Admitting: Certified Registered Nurse Anesthetist

## 2020-09-15 ENCOUNTER — Encounter: Payer: Self-pay | Admitting: Gastroenterology

## 2020-09-15 ENCOUNTER — Other Ambulatory Visit: Payer: Self-pay

## 2020-09-15 ENCOUNTER — Ambulatory Visit (AMBULATORY_SURGERY_CENTER): Payer: Medicare Other | Admitting: Gastroenterology

## 2020-09-15 VITALS — BP 133/81 | HR 67 | Temp 97.7°F | Resp 11 | Ht 71.0 in | Wt 289.0 lb

## 2020-09-15 DIAGNOSIS — Z860100 Personal history of colon polyps, unspecified: Secondary | ICD-10-CM

## 2020-09-15 DIAGNOSIS — I10 Essential (primary) hypertension: Secondary | ICD-10-CM | POA: Diagnosis not present

## 2020-09-15 DIAGNOSIS — G4733 Obstructive sleep apnea (adult) (pediatric): Secondary | ICD-10-CM | POA: Diagnosis not present

## 2020-09-15 DIAGNOSIS — D122 Benign neoplasm of ascending colon: Secondary | ICD-10-CM | POA: Diagnosis not present

## 2020-09-15 DIAGNOSIS — D123 Benign neoplasm of transverse colon: Secondary | ICD-10-CM | POA: Diagnosis not present

## 2020-09-15 DIAGNOSIS — D120 Benign neoplasm of cecum: Secondary | ICD-10-CM | POA: Diagnosis not present

## 2020-09-15 MED ORDER — SODIUM CHLORIDE 0.9 % IV SOLN: 500.0000 mL | Freq: Once | INTRAVENOUS | Status: DC

## 2020-09-15 NOTE — Patient Instructions (Signed)
HANDOUTS ON POLYPS & DIVERTICULOSIS GIVEN TO YOU TODAY  AWAIT PATHOLOGY ON POLYPS REMOVED    YOU HAD AN ENDOSCOPIC PROCEDURE TODAY AT [facility]:   Refer to the procedure report that was given to you for any specific questions about what was found during the examination.  If the procedure report does not answer your questions, please call your gastroenterologist to clarify.  If you requested that your care partner not be given the details of your procedure findings, then the procedure report has been included in a sealed envelope for you to review at your convenience later.  YOU SHOULD EXPECT: Some feelings of bloating in the abdomen. Passage of more gas than usual.  Walking can help get rid of the air that was put into your GI tract during the procedure and reduce the bloating. If you had a lower endoscopy (such as a colonoscopy or flexible sigmoidoscopy) you may notice spotting of blood in your stool or on the toilet paper. If you underwent a bowel prep for your procedure, you may not have a normal bowel movement for a few days.  Please Note:  You might notice some irritation and congestion in your nose or some drainage.  This is from the oxygen used during your procedure.  There is no need for concern and it should clear up in a day or so.  SYMPTOMS TO REPORT IMMEDIATELY:  Following lower endoscopy (colonoscopy or flexible sigmoidoscopy):  Excessive amounts of blood in the stool  Significant tenderness or worsening of abdominal pains  Swelling of the abdomen that is new, acute  Fever of 100F or higher   For urgent or emergent issues, a gastroenterologist can be reached at any hour by calling [phone]. Do not use MyChart messaging for urgent concerns.    DIET:  We do recommend a small meal at first, but then you may proceed to your regular diet.  Drink plenty of fluids but you should avoid alcoholic beverages for 24 hours.  ACTIVITY:  You should plan to take  it easy for the rest of today and you should NOT DRIVE or use heavy machinery until tomorrow (because of the sedation medicines used during the test).    FOLLOW UP: Our staff will call the number listed on your records 48-72 hours following your procedure to check on you and address any questions or concerns that you may have regarding the information given to you following your procedure. If we do not reach you, we will leave a message.  We will attempt to reach you two times.  During this call, we will ask if you have developed any symptoms of COVID 19. If you develop any symptoms (ie: fever, flu-like symptoms, shortness of breath, cough etc.) before then, please call [phone].  If you test positive for Covid 19 in the 2 weeks post procedure, please call and report this information to [place].    If any biopsies were taken you will be contacted by phone or by letter within the next 1-3 weeks.  Please call us at [phone] if you have not heard about the biopsies in 3 weeks.    SIGNATURES/CONFIDENTIALITY: You and/or your care partner have signed paperwork which will be entered into your electronic medical record.  These signatures attest to the fact that that the information above on your After Visit Summary has been reviewed and is understood.  Full responsibility of the confidentiality of this discharge information lies with you and/or your  care-partner.  

## 2020-09-15 NOTE — Op Note (Signed)
[place] Patient Name: Michael Conway Procedure Date: 09/15/2020 2:18 PM MRN: [id number] Endoscopist: Mallie Mussel L. Loletha Carrow , MD Age: 70 Referring MD:  Date of Birth: 1950-04-07 Gender: Male Account #: [id number] Procedure:                Colonoscopy Indications:              Surveillance: History of adenomatous polyps,                            inadequate prep on last exam (<79yr)                           At least one TA in Jan 2021 at outside practice -                            poor prep.                           Poor prep June 2021                           Lifelong constipation Medicines:                Monitored Anesthesia Care Procedure:                Pre-Anesthesia Assessment:                           - Prior to the procedure, a History and Physical                            was performed, and patient medications and                            allergies were reviewed. The patient's tolerance of                            previous anesthesia was also reviewed. The risks                            and benefits of the procedure and the sedation                            options and risks were discussed with the patient.                            All questions were answered, and informed consent                            was obtained. Prior Anticoagulants: The patient has                            taken no previous anticoagulant or antiplatelet                            agents. ASA  Grade Assessment: III - A patient with                            severe systemic disease. After reviewing the risks                            and benefits, the patient was deemed in                            satisfactory condition to undergo the procedure.                           After obtaining informed consent, the colonoscope                            was passed under direct vision. Throughout the                            procedure, the patient's blood pressure, pulse, and                             oxygen saturations were monitored continuously. The                            Olympus CF-HQ190 [id number]) Colonoscope was                            introduced through the anus and advanced to the the                            cecum, identified by appendiceal orifice and                            ileocecal valve. The colonoscopy was performed with                            difficulty due to a redundant colon, significant                            looping and a tortuous colon. Successful completion                            of the procedure was aided by using manual pressure                            and lavage. The patient tolerated the procedure                            well. The quality of the bowel preparation was                            initially fair, improved to good after lavage. The  ileocecal valve, appendiceal orifice, and rectum                            were photographed. The bowel preparation used was 2                            day Suprep/Miralax/Magnesium citrate. Scope In: 2:49:00 PM Scope Out: 3:17:49 PM Scope Withdrawal Time: 0 hours 22 minutes 19 seconds  Total Procedure Duration: 0 hours 28 minutes 49 seconds  Findings:                 The perianal and digital rectal examinations were                            normal.                           The colon (entire examined portion) was                            significantly redundant, especially the left colon.                            Lavage performed to clear residual opaque liquid                            material.                           Two semi-sessile polyps were found in the proximal                            ascending colon and cecum. The polyps were                            diminutive in size. These polyps were removed with                            a cold biopsy forceps. Resection and retrieval were                             complete.                           Three sessile polyps were found in the transverse                            colon. The polyps were 3 to 5 mm in size. These                            polyps were removed with a cold snare. Resection                            and retrieval were complete.  A single diverticulum was found in the right colon.                           An area of melanosis was found in the entire colon.                           The exam was otherwise without abnormality on                            direct and retroflexion views. Complications:            No immediate complications. Estimated Blood Loss:     Estimated blood loss was minimal. Impression:               - Redundant colon.                           - Two diminutive polyps in the proximal ascending                            colon and in the cecum, removed with a cold biopsy                            forceps. Resected and retrieved.                           - Three 3 to 5 mm polyps in the transverse colon,                            removed with a cold snare. Resected and retrieved.                           - Diverticulosis in the right colon.                           - Melanosis in the colon.                           - The examination was otherwise normal on direct                            and retroflexion views. Recommendation:           - Patient has a contact number available for                            emergencies. The signs and symptoms of potential                            delayed complications were discussed with the                            patient. Return to normal activities tomorrow.  Written discharge instructions were provided to the                            patient.                           - Resume previous diet.                           - Continue present medications.                           - Await pathology results.                            - Repeat colonoscopy is recommended for                            surveillance. The colonoscopy date will be                            determined after pathology results from today's                            exam become available for review. This 2 day prep                            with additional late AM mag citrate and mid to late                            PM scope time gave a better result that prior exams. Kameron Glazebrook L. Loletha Carrow, MD 09/15/2020 3:27:37 PM This report has been signed electronically.

## 2020-09-15 NOTE — Progress Notes (Signed)
VS by [facility]  Pt's states no medical or surgical changes since previsit or office visit.  

## 2020-09-15 NOTE — Progress Notes (Signed)
Called to room to assist during endoscopic procedure.  Patient ID and intended procedure confirmed with present staff. Received instructions for my participation in the procedure from the performing physician.  

## 2020-09-15 NOTE — Progress Notes (Signed)
Report given to PACU, vss 

## 2020-09-17 ENCOUNTER — Telehealth: Payer: Self-pay

## 2020-09-17 NOTE — Telephone Encounter (Signed)
  Follow up Call-  Call back number 09/15/2020 08/29/2019  Post procedure Call Back phone  # [phone]  Permission to leave phone message Yes Yes  Some recent data might be hidden     Patient questions:  Do you have a fever, pain , or abdominal swelling? No. Pain Score  0 *  Have you tolerated food without any problems? Yes.    Have you been able to return to your normal activities? Yes.    Do you have any questions about your discharge instructions: Diet   No. Medications  No. Follow up visit  No.  Do you have questions or concerns about your Care? No.  Actions: Pain under 4, no action

## 2020-09-19 ENCOUNTER — Encounter: Payer: Self-pay | Admitting: Gastroenterology

## 2020-10-23 DIAGNOSIS — N5201 Erectile dysfunction due to arterial insufficiency: Secondary | ICD-10-CM | POA: Diagnosis not present

## 2020-10-30 DIAGNOSIS — N400 Enlarged prostate without lower urinary tract symptoms: Secondary | ICD-10-CM | POA: Diagnosis not present

## 2020-10-30 DIAGNOSIS — N5201 Erectile dysfunction due to arterial insufficiency: Secondary | ICD-10-CM | POA: Diagnosis not present

## 2020-10-30 DIAGNOSIS — E291 Testicular hypofunction: Secondary | ICD-10-CM | POA: Diagnosis not present

## 2020-11-06 DIAGNOSIS — E291 Testicular hypofunction: Secondary | ICD-10-CM | POA: Diagnosis not present

## 2020-11-21 ENCOUNTER — Other Ambulatory Visit: Payer: Self-pay

## 2020-11-21 ENCOUNTER — Encounter: Payer: Self-pay | Admitting: Internal Medicine

## 2020-11-21 ENCOUNTER — Ambulatory Visit (INDEPENDENT_AMBULATORY_CARE_PROVIDER_SITE_OTHER): Payer: Medicare Other | Admitting: Internal Medicine

## 2020-11-21 VITALS — BP 142/76 | HR 81 | Temp 98.3°F | Ht 71.0 in | Wt 272.0 lb

## 2020-11-21 DIAGNOSIS — R030 Elevated blood-pressure reading, without diagnosis of hypertension: Secondary | ICD-10-CM

## 2020-11-21 DIAGNOSIS — G43711 Chronic migraine without aura, intractable, with status migrainosus: Secondary | ICD-10-CM

## 2020-11-21 DIAGNOSIS — M659 Synovitis and tenosynovitis, unspecified: Secondary | ICD-10-CM

## 2020-11-21 MED ORDER — TAMSULOSIN HCL 0.4 MG PO CAPS: 0.4000 mg | ORAL_CAPSULE | Freq: Every day | ORAL | 3 refills | Status: DC

## 2020-11-21 MED ORDER — PREDNISONE 10 MG PO TABS
ORAL_TABLET | ORAL | 0 refills | Status: DC
Start: 2020-11-21 — End: 2021-01-08

## 2020-11-21 NOTE — Progress Notes (Deleted)
ed3

## 2020-11-21 NOTE — Patient Instructions (Signed)
You might want to consider Roselyn Meier and Qlipta for migraine with the [facility] neurology  You appear to have severe tendonitis of the right thumb  Please take all new medication as prescribed - the prednisone course if you like, and/or see the Sports Medicine on the first floor regarding possible cortisone to the thumb tendon  Please continue all other medications as before, and refills have been done if requested.  Please have the pharmacy call with any other refills you may need.  Please keep your appointments with your specialists as you may have planned

## 2020-11-23 ENCOUNTER — Encounter: Payer: Self-pay | Admitting: Internal Medicine

## 2020-11-23 DIAGNOSIS — M65949 Unspecified synovitis and tenosynovitis, unspecified hand: Secondary | ICD-10-CM | POA: Insufficient documentation

## 2020-11-23 DIAGNOSIS — R030 Elevated blood-pressure reading, without diagnosis of hypertension: Secondary | ICD-10-CM | POA: Insufficient documentation

## 2020-11-23 DIAGNOSIS — M659 Synovitis and tenosynovitis, unspecified: Secondary | ICD-10-CM | POA: Insufficient documentation

## 2020-11-23 NOTE — Assessment & Plan Note (Signed)
Mild, for diet and low salt, wt control, and cont to f/u BP at home and next visit

## 2020-11-23 NOTE — Progress Notes (Signed)
Patient ID: Michael Conway, male   DOB: February 28, 1951, 70 y.o.   MRN: [id number]        Chief Complaint: right thumb pain, htn, daily migraine       HPI:  Michael Conway is a 70 y.o. male here with 1 wk onset right thumb pain and tenderness to the dorsal aspect with mild swelling, constant, sharp, overall mild to mod pain, worse to use the thumb, better to rest, and no recent hx of trauma or fever.  Pt denies chest pain, increased sob or doe, wheezing, orthopnea, PND, increased LE swelling, palpitations, dizziness or syncope.   Pt denies polydipsia, polyuria, or new focal neuro s/s, though he does have dialy migraine, just none today so far.  Denies worsening depressive symptoms, suicidal ideation, or panic; has ongoing anxiety.  Has been texting a lot recenlty and feels this may be the cause,, as this also makes it worse.      BP at home has been < 140/90 Wt Readings from Last 3 Encounters:  11/21/20 272 lb (123.4 kg)  09/15/20 289 lb (131.1 kg)  09/01/20 289 lb (131.1 kg)   BP Readings from Last 3 Encounters:  11/21/20 (!) 142/76  09/15/20 133/81  10/26/19 124/68         Past Medical History:  Diagnosis Date   Allergy    Arthritis    Colon polyps    Depression    Hearing impaired person, bilateral    Hypertension    Low testosterone    Migraines    OSA (obstructive sleep apnea) 09/27/2019   Sleep apnea    uses cpap   Vestibular migraine    Past Surgical History:  Procedure Laterality Date   BICEPS TENDON REPAIR Left    INNER EAR SURGERY Right    KNEE ARTHROSCOPY Left    ROTATOR CUFF REPAIR Bilateral    TYMPANOSTOMY TUBE PLACEMENT     WISDOM TOOTH EXTRACTION      reports that he quit smoking about 20 years ago. His smoking use included cigarettes. He has never used smokeless tobacco. He reports current alcohol use. He reports that he does not use drugs. family history includes Colon polyps in his father; Dementia (age of onset: 69) in his father; Heart attack (age of onset:  15) in his mother; Heart failure in his mother; Hypertension in his mother and sister; Lung cancer in his sister; Lung disease in his sister; Melanoma in his sister; Parkinsonism in his father; Prostate cancer in an other family member. Allergies  Allergen Reactions   Peppermint Oil Shortness Of Breath   Current Outpatient Medications on File Prior to Visit  Medication Sig Dispense Refill   Cholecalciferol (VITAMIN D3) 1.25 MG (50000 UT) TABS Take 1.25 mg by mouth once a week.     Cyanocobalamin (VITAMIN B-12) 3000 MCG SUBL 1 tablet     folic acid (FOLVITE) 1 MG tablet Take 1 tablet by mouth daily.     ketoconazole (NIZORAL) 2 % shampoo Apply topically.     MAGNESIUM PO Take 500 mg by mouth daily.      Multiple Vitamin (MULTIVITAMIN) tablet Take 1 tablet by mouth daily.     Multiple Vitamins-Minerals (CENTRUM SILVER 50+MEN PO) 1 tablet     naproxen sodium (ALEVE) 220 MG tablet Take by mouth.     OnabotulinumtoxinA (BOTOX IJ) Inject as directed as needed.     rizatriptan (MAXALT) 10 MG tablet Take 1 tablet earliest onset of migraine.  May repeat  in 2 hours if needed.  Maximum 2 tablets in 24 hours (Patient taking differently: Take 1 tablet earliest onset of migraine.  May repeat in 2 hours if needed.  Maximum 2 tablets in 24 hours) 10 tablet 3   testosterone cypionate (DEPOTESTOSTERONE CYPIONATE) 200 MG/ML injection Inject into the muscle.     triamcinolone ointment (KENALOG) 0.1 % Apply topically.     vitamin E 45 MG (100 UNITS) capsule 1 capsule     Whey Protein POWD      Zinc 50 MG TABS 1 tablet     No current facility-administered medications on file prior to visit.        ROS:  All others reviewed and negative.  Objective        PE:  BP (!) 142/76 (BP Location: Right Arm, Patient Position: Sitting, Cuff Size: Large)   Pulse 81   Temp 98.3 F (36.8 C) (Oral)   Ht '5\' 11"'$  (1.803 m)   Wt 272 lb (123.4 kg)   SpO2 96%   BMI 37.94 kg/m                 Constitutional: Pt appears in  NAD               HENT: Head: NCAT.                Right Ear: External ear normal.                 Left Ear: External ear normal.                Eyes: . Pupils are equal, round, and reactive to light. Conjunctivae and EOM are normal               Nose: without d/c or deformity               Neck: Neck supple. Gross normal ROM               Cardiovascular: Normal rate and regular rhythm.                 Pulmonary/Chest: Effort normal and breath sounds without rales or wheezing.                Right thumb with marked pain, tender, swelling to dorsal aspect to just proximal to the wrist               Neurological: Pt is alert. At baseline orientation, motor grossly intact               Skin: Skin is warm. No rashes, no other new lesions, LE edema - none               Psychiatric: Pt behavior is normal without agitation   Micro: none  Cardiac tracings I have personally interpreted today:  none  Pertinent Radiological findings (summarize): none   Lab Results  Component Value Date   WBC 7.7 12/27/2018   HGB 15.0 12/27/2018   HCT 43.8 12/27/2018   PLT 222.0 12/27/2018   GLUCOSE 95 12/27/2018   CHOL 140 12/27/2018   TRIG 140.0 12/27/2018   HDL 40.80 12/27/2018   LDLCALC 71 12/27/2018   ALT 17 12/27/2018   AST 21 12/27/2018   NA 138 12/27/2018   K 4.1 12/27/2018   CL 104 12/27/2018   CREATININE 1.07 12/27/2018   BUN 24 (H) 12/27/2018   CO2 25 12/27/2018   TSH 3.58 05/12/2018  PSA 1.40 06/02/2016   HGBA1C 5.4 12/27/2018   Assessment/Plan:  Michael Conway is a 70 y.o. White or Caucasian [1] male with  has a past medical history of Allergy, Arthritis, Colon polyps, Depression, Hearing impaired person, bilateral, Hypertension, Low testosterone, Migraines, OSA (obstructive sleep apnea) (09/27/2019), Sleep apnea, and Vestibular migraine.  Intractable chronic migraine without aura and with status migrainosus Persistent chronic, to continue current med tx,  to f/u any worsening  symptoms or concerns with neurology he has been seeing  Elevated blood pressure, situational Mild, for diet and low salt, wt control, and cont to f/u BP at home and next visit  Flexor tenosynovitis of thumb Mod to severe, does not want referral to sports med or ortho, for predpac asd, and alleve prn,  to f/u any worsening symptoms or concerns  Followup: Return if symptoms worsen or fail to improve.  Cathlean Cower, MD 11/23/2020 8:42 PM [facility]

## 2020-11-23 NOTE — Assessment & Plan Note (Addendum)
Mod to severe, does not want referral to sports med or ortho, for predpac asd, and alleve prn,  to f/u any worsening symptoms or concerns

## 2020-11-23 NOTE — Assessment & Plan Note (Signed)
Persistent chronic, to continue current med tx,  to f/u any worsening symptoms or concerns with neurology he has been seeing

## 2020-11-24 NOTE — Progress Notes (Signed)
Subjective:    CC: R wrist and thumb pain  I, Molly Weber, LAT, ATC, am serving as scribe for Dr. Lynne Leader.  HPI: Pt is a 70 y/o male presenting w/ R wrist and thumb pain x approximately 10 days w/ no known MOI.  He locates his pain to radial aspect of thumb, into MC, and anatomical snuffbox. 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 PCP prescribed prednisone, but he hasn't started taking it yet.   Swelling: yes- and warm  Aggravating factors: R thumb AROM; Treatments tried: Aleve;   Pertinent review of Systems: No fevers or chills  Relevant historical information: History of prior exposure to artillery fire and in the [employer].  Has had extensive evaluation with audiology and neurology including MRI of the brain.   Objective:    Vitals:   11/25/20 0943  BP: 128/80  Pulse: 65  SpO2: 97%   General: Well Developed, well nourished, and in no acute distress.   MSK: Right wrist slight swelling at the radial styloid. Tender palpation radial styloid. Positive Finkelstein's test. Grip strength intact. Pulses cap refill and sensation are intact distally.  Lab and Radiology Results  Diagnostic Limited MSK Ultrasound of: Right wrist First dorsal wrist compartment evaluated with enlarged tendon with surrounding hypoechoic fluid and tendon sheath consistent with tenosynovitis without definitive tear. First CMC with mild effusion without severe degenerative changes. First MCP with mild effusion. No acute fractures. Impression: De Quervain's tenosynovitis and mild DJD.  X-ray images right wrist obtained today personally and independently interpreted First CMC DJD.  No acute fractures. Await formal radiology review   Impression and Recommendations:    Assessment and Plan: 70 y.o. male with right wrist pain thought to be primarily due to de Quervain's tenosynovitis.   Patient also has some evidence of first [place] DJD which probably is some source of pain.  After discussion plan to treat with thumb spica splint, Voltaren gel, and hand PT/OT.  Recheck in about a month.  If not improved consider steroid injection.  Vertigo.  Patient has persistent vertigo.  He has had extensive evaluation already with neurology and audiology.  He has not had a great trial of vestibular PT however.  This seems to me to be an obvious next step.  Plan for referral to vestibular physical therapy.  Explained that he is probably going to have some benefit from this but not complete resolution of his symptoms.Marland Kitchen  PDMP not reviewed this encounter. Orders Placed This Encounter  Procedures   [place] LIMITED JOINT SPACE STRUCTURES UP RIGHT(NO LINKED CHARGES)    Standing Status:   Future    Number of Occurrences:   1    Standing Expiration Date:   05/26/2021    Order Specific Question:   Reason for Exam (SYMPTOM  OR DIAGNOSIS REQUIRED)    Answer:   right wrist pain    Order Specific Question:   Preferred imaging location?    Answer:   [facility]   DG Wrist Complete Right    Standing Status:   Future    Number of Occurrences:   1    Standing Expiration Date:   11/25/2021    Order Specific Question:   Reason for Exam (SYMPTOM  OR DIAGNOSIS REQUIRED)    Answer:   right wrist pain    Order Specific Question:   Preferred imaging  location?    Answer:   [facility]   Ambulatory referral to Physical Therapy    Referral Priority:   Routine    Referral Type:   Physical Medicine    Referral Reason:   Specialty Services Required    Requested Specialty:   Physical Therapy    Number of Visits Requested:   1   Ambulatory referral to Physical Therapy    Referral Priority:   Routine    Referral Type:   Physical Medicine    Referral Reason:   Specialty Services Required    Requested Specialty:   Physical Therapy    Number of Visits Requested:   1   No orders of the  defined types were placed in this encounter.   Discussed warning signs or symptoms. Please see discharge instructions. Patient expresses understanding.   The above documentation has been reviewed and is accurate and complete Lynne Leader, M.D.

## 2020-11-25 ENCOUNTER — Other Ambulatory Visit: Payer: Self-pay

## 2020-11-25 ENCOUNTER — Ambulatory Visit (INDEPENDENT_AMBULATORY_CARE_PROVIDER_SITE_OTHER): Payer: Medicare Other | Admitting: Family Medicine

## 2020-11-25 ENCOUNTER — Ambulatory Visit (INDEPENDENT_AMBULATORY_CARE_PROVIDER_SITE_OTHER): Payer: Medicare Other

## 2020-11-25 ENCOUNTER — Ambulatory Visit: Payer: Self-pay

## 2020-11-25 VITALS — BP 128/80 | HR 65 | Ht 71.0 in | Wt 275.2 lb

## 2020-11-25 DIAGNOSIS — R42 Dizziness and giddiness: Secondary | ICD-10-CM

## 2020-11-25 DIAGNOSIS — M25531 Pain in right wrist: Secondary | ICD-10-CM

## 2020-11-25 DIAGNOSIS — M654 Radial styloid tenosynovitis [de Quervain]: Secondary | ICD-10-CM | POA: Diagnosis not present

## 2020-11-25 DIAGNOSIS — M659 Synovitis and tenosynovitis, unspecified: Secondary | ICD-10-CM

## 2020-11-25 DIAGNOSIS — M25532 Pain in left wrist: Secondary | ICD-10-CM | POA: Diagnosis not present

## 2020-11-25 DIAGNOSIS — M1811 Unilateral primary osteoarthritis of first carpometacarpal joint, right hand: Secondary | ICD-10-CM | POA: Insufficient documentation

## 2020-11-25 NOTE — Patient Instructions (Addendum)
Thank you for coming in today.   Please get an Xray today before you leave   I've referred you to Physical Therapy.  Let us know if you don't hear from them in one week.   Work on the home exercises. View at [email or web address]-[email or web address] using code: [id number]  Please use Voltaren gel (Generic Diclofenac Gel) up to 4x daily for pain as needed.  This is available over-the-counter as both the name brand Voltaren gel and the generic diclofenac gel.   Please go to [facility] supply to get the thumb spica splint we talked about today. You may also be able to get it from [employer].    Recheck in about 1 month.

## 2020-11-27 ENCOUNTER — Telehealth: Payer: Self-pay | Admitting: Family Medicine

## 2020-11-27 NOTE — Telephone Encounter (Signed)
Message received from recent referral entered for patient.  Please advise..   FromJanett Labella Sent: 11/26/2020   2:21 PM EDT To: Gregor Hams, MD   Good Afternoon,   We have reviewed the referral received for the above patient.  In order to schedule the patient for OT accordingly, we will need you to update and or submit a corrected referral to correspond with the Dx listed. The Dx for wrist pain will be for OT/Occupational therapy vs. PT/Physical therapy services.     If you have any questions and or need further assistance, please let us know.     Thank you.

## 2020-11-27 NOTE — Progress Notes (Signed)
X-ray right wrist shows no acute fracture or dislocation.

## 2020-12-01 ENCOUNTER — Telehealth: Payer: Self-pay | Admitting: Internal Medicine

## 2020-12-01 NOTE — Telephone Encounter (Signed)
LVM for pt to rtn my call to schedule AWV with [facility]. Please schedule this appt if pt calls the office.  °

## 2020-12-03 NOTE — Telephone Encounter (Signed)
Please contact the location.  I submitted both PT and OT for 2 different issues.  I know OT was cemented correctly for de Quervain's tenosynovitis.  Please ask them what they actually need.

## 2020-12-04 ENCOUNTER — Other Ambulatory Visit: Payer: Self-pay | Admitting: Physical Therapy

## 2020-12-04 DIAGNOSIS — M25531 Pain in right wrist: Secondary | ICD-10-CM

## 2020-12-04 DIAGNOSIS — M654 Radial styloid tenosynovitis [de Quervain]: Secondary | ICD-10-CM

## 2020-12-04 NOTE — Telephone Encounter (Signed)
Called [facility] to clarify and they needed a new referral placed to OT to their location as prior referral was placed to [facility] referral placed.

## 2020-12-09 ENCOUNTER — Encounter: Payer: Medicare Other | Admitting: Occupational Therapy

## 2020-12-12 ENCOUNTER — Other Ambulatory Visit: Payer: Self-pay

## 2020-12-12 ENCOUNTER — Ambulatory Visit (INDEPENDENT_AMBULATORY_CARE_PROVIDER_SITE_OTHER): Payer: Medicare Other | Admitting: Neurology

## 2020-12-12 DIAGNOSIS — G43709 Chronic migraine without aura, not intractable, without status migrainosus: Secondary | ICD-10-CM

## 2020-12-12 MED ORDER — ONABOTULINUMTOXINA 100 UNITS IJ SOLR
155.0000 [IU] | Freq: Once | INTRAMUSCULAR | Status: AC
  Administered 2020-12-12: 155 [IU] via INTRAMUSCULAR

## 2020-12-12 NOTE — Progress Notes (Signed)
[facility]   Procedure Note Botox  Attending: Dr. Metta Clines  Preoperative Diagnosis(es): Chronic migraine  Consent obtained from: The patient Benefits discussed included, but were not limited to decreased muscle tightness, increased joint range of motion, and decreased pain.  Risk discussed included, but were not limited pain and discomfort, bleeding, bruising, excessive weakness, venous thrombosis, muscle atrophy and dysphagia.  Anticipated outcomes of the procedure as well as he risks and benefits of the alternatives to the procedure, and the roles and tasks of the personnel to be involved, were discussed with the patient, and the patient consents to the procedure and agrees to proceed. A copy of the patient medication guide was given to the patient which explains the blackbox warning.  Patients identity and treatment sites confirmed Yes.  .  Details of Procedure: Skin was cleaned with alcohol. Prior to injection, the needle plunger was aspirated to make sure the needle was not within a blood vessel.  There was no blood retrieved on aspiration.    Following is a summary of the muscles injected  And the amount of Botulinum toxin used:  Dilution 200 units of Botox was reconstituted with 4 ml of preservative free normal saline. Time of reconstitution: At the time of the office visit (<30 minutes prior to injection)   Injections  155 total units of Botox was injected with a 30 gauge needle.  Injection Sites: L occipitalis: 15 units- 3 sites  R occiptalis: 15 units- 3 sites  L upper trapezius: 15 units- 3 sites R upper trapezius: 15 units- 3 sits          L paraspinal: 10 units- 2 sites R paraspinal: 10 units- 2 sites  Face L frontalis(2 injection sites):10 units   R frontalis(2 injection sites):10 units         L corrugator: 5 units   R corrugator: 5 units           Procerus: 5 units   L temporalis: 20 units R temporalis: 20 units   Agent:  200 units of botulinum Type  A (Onobotulinum Toxin type A) was reconstituted with 4 ml of preservative free normal saline.  Time of reconstitution: At the time of the office visit (<30 minutes prior to injection)     Total injected (Units):  155  Total wasted (Units):  none wasted  Patient tolerated procedure well without complications.   Reinjection is anticipated in 3 months.

## 2020-12-12 NOTE — Addendum Note (Signed)
Addended by: Venetia Night on: 12/12/2020 03:31 PM   Modules accepted: Orders

## 2020-12-19 ENCOUNTER — Encounter: Payer: Self-pay | Admitting: Occupational Therapy

## 2020-12-19 ENCOUNTER — Ambulatory Visit: Payer: Medicare Other | Attending: Family Medicine | Admitting: Occupational Therapy

## 2020-12-19 ENCOUNTER — Other Ambulatory Visit: Payer: Self-pay

## 2020-12-19 DIAGNOSIS — M25631 Stiffness of right wrist, not elsewhere classified: Secondary | ICD-10-CM | POA: Insufficient documentation

## 2020-12-19 DIAGNOSIS — M25341 Other instability, right hand: Secondary | ICD-10-CM | POA: Insufficient documentation

## 2020-12-19 DIAGNOSIS — M6281 Muscle weakness (generalized): Secondary | ICD-10-CM | POA: Diagnosis not present

## 2020-12-19 DIAGNOSIS — M25531 Pain in right wrist: Secondary | ICD-10-CM | POA: Insufficient documentation

## 2020-12-19 DIAGNOSIS — M25541 Pain in joints of right hand: Secondary | ICD-10-CM | POA: Insufficient documentation

## 2020-12-19 NOTE — Therapy (Signed)
[facility] [address] [place], [place], [address] Phone: [phone]   Fax:  [phone]  Occupational Therapy Evaluation  Patient Details  Name: Michael Conway MRN: [id number] Date of Birth: 11-11-1950 Referring Provider (OT): Lynne Leader, MD   Encounter Date: 12/19/2020   OT End of Session - 12/19/20 1221     Visit Number 1    Number of Visits 13    Date for OT Re-Evaluation 02/13/21   12 visits over 8 weeks d/t any scheduling conflicts   Authorization Type Medicare [employer]    Authorization Time Period Covered 100% Follow Medicare Guidelines    Progress Note Due on Visit 10    OT Start Time 1230    OT Stop Time 1315    OT Time Calculation (min) 45 min    Activity Tolerance Patient tolerated treatment well    Behavior During Therapy [facility] for tasks assessed/performed             Past Medical History:  Diagnosis Date   Allergy    Arthritis    Colon polyps    Depression    Hearing impaired person, bilateral    Hypertension    Low testosterone    Migraines    OSA (obstructive sleep apnea) 09/27/2019   Sleep apnea    uses cpap   Vestibular migraine     Past Surgical History:  Procedure Laterality Date   BICEPS TENDON REPAIR Left    INNER EAR SURGERY Right    KNEE ARTHROSCOPY Left    ROTATOR CUFF REPAIR Bilateral    TYMPANOSTOMY TUBE PLACEMENT     WISDOM TOOTH EXTRACTION      There were no vitals filed for this visit.   Subjective Assessment - 12/19/20 1221     Subjective  Pt is a 70 year old male that presents to [facility] with referral for R wrist pain and DeQuervains Syndrome. Pt with positive Finklestein's test with Dr. Lynne Leader indicating DeQuervain's in [place]. Pt with pain reported 7/10 in RUE wrist that is throbbing in nature with brace on. Pt reports pain without brace is sharp and increased. Pt reports difficulty and increased pain with toilet hygiene and with washing and brushing  hair. Pt is independent with ADLs and IADLs, retired and enjoys working with sons, working out (although not currently d/t pain) and traveling. Pt had some concern with getting cortisone shot d/t only treating symptoms and not the condition. Pt in agreement for iontophoresis for pain management.    Pertinent History Migraines, Vertigo, HOH, Arthritis    Limitations Hearing Impaired, Vertigo    Special Tests Finklestein's Test - positive    Patient Stated Goals Just wants the pain gone    Currently in Pain? Yes    Pain Score 7     Pain Location Wrist    Pain Orientation Right    Pain Descriptors / Indicators Throbbing    Pain Type Acute pain    Pain Onset More than a month ago    Pain Frequency Constant    Aggravating Factors  use - worse without brace    Pain Relieving Factors taking Tylenol  (not sure if relieving)               [facility] OT Assessment - 12/19/20 1233       Assessment   Medical Diagnosis DeQuervain's Syndrome    Referring Provider (OT) Lynne Leader, MD    Onset Date/Surgical Date 11/15/20  Hand Dominance Right      Precautions   Precautions None    Required Braces or Orthoses Other Brace/Splint    Other Brace/Splint thumb spica, and longer one for [place] expects to be discharged to: Private residence    Living Arrangements Spouse/significant other    Type of [place]    Additional Comments pt is independent with ADLs and IADLs. Pt is able to access home without issue      Prior Function   Level of Independence Independent    Vocation Retired    Leisure travel, work with sons, workout      ADL   Eating/Feeding Modified independent    Grooming Modified independent   painful for brushing teeth and brushing and washing hair   Upper Body Bathing Modified independent    Lower Body Bathing Modified independent    Upper Body Dressing Independent    Lower Body Dressing Modified independent    Toilet Transfer Modified  independent    Toileting - Clothing Manipulation Modified independent    [place] Increase time   max difficulty and pain   Tub/Shower Transfer Modified independent      IADL   Shopping Takes care of all shopping needs independently    [place] alone or with occasional assistance;Performs light daily tasks but cannot maintain acceptable level of cleanliness;Does personal laundry completely    Meal Prep Plans, prepares and serves adequate meals independently   painful with holding heavier objects   [place] own vehicle    Medication Management Is responsible for taking medication in correct dosages at correct time    Physiological scientist financial matters independently (budgets, writes checks, pays rent, bills goes to bank), collects and keeps track of income      Written Expression   Dominant Hand Right      Observation/Other Assessments   Focus on Therapeutic Outcomes (FOTO)  N/A      Sensation   Light Touch Appears Intact    Hot/Cold Appears Intact      Coordination   9 Hole Peg Test Left;Right    Right 9 Hole Peg Test 22.28s    Left 9 Hole Peg Test 23.66s      ROM / Strength   AROM / PROM / Strength AROM;Strength      AROM   Overall AROM  Within functional limits for tasks performed    Overall AROM Comments BUE wrists limited in extension but [facility]      Strength   Overall Strength Within functional limits for tasks performed      Hand Function   Right Hand Gross Grasp Functional    Right Hand Grip (lbs) 90.8    Left Hand Gross Grasp Functional    Left Hand Grip (lbs) 104.9                              OT Education - 12/19/20 1423     Education Details Education on Iontophoresis.    Person(s) Educated Patient    Methods Explanation;Verbal cues    Comprehension Verbalized understanding;Verbal cues required;Need further instruction              OT Short Term Goals - 12/19/20  1412       OT SHORT TERM GOAL #1   Title Pt will be independent with initial HEP  Time 4    Period Weeks    Status New    Target Date 01/16/21      OT SHORT TERM GOAL #2   Title Pt will verbalize understanding of pain management strategies    Time 4    Period Weeks    Status New      OT SHORT TERM GOAL #3   Title Pt will demonstrate and verbalize understanding of brace and splint wear and care PRN    Time 4    Period Weeks    Status New      OT SHORT TERM GOAL #4   Title Pt will report pain no greater than 6/10 with completing HEP or during functional activities (washing/brushing hair, toilet hygiene)    Time 4    Period Weeks    Status New      OT SHORT TERM GOAL #5   Title Pt will verbalize understanding of adapted strategies in order to decrease pain while completing ADLs and IADLs.    Time 4    Period Weeks    Status New               OT Long Term Goals - 12/19/20 1415       OT LONG TERM GOAL #1   Title Pt will be independent with any updated HEPs    Time 8    Period Weeks    Status New    Target Date 02/13/21      OT LONG TERM GOAL #2   Title Pt will increase grip strength in RUE to 100lbs or greater, dominant hand    Baseline R 90.8, L 104.9    Time 8    Period Weeks    Status New      OT LONG TERM GOAL #3   Title Pt will report pain no greater than 5/10 in RUE wrist while completing functional tasks (brushing/washing hair, toilet hygiene)    Time 8    Period Weeks    Status New      OT LONG TERM GOAL #4   Title Pt will verbalize understanding of sleep positions and report increased comfort and decreased pain of RUE    Time 8    Period Weeks    Status New                   Plan - 12/19/20 1408     Clinical Impression Statement Pt is a 70 year old male that presents to [facility] with referral for R wrist pain and DeQuervains Syndrome. Pt with positive Finklestein's test with Dr. Sherene Sires indicating DeQuervain's in [place]. Pt  presents with RUE pain and decreased strength. Skilled occupational therapy is recommended to target listed areas of deficit, decrease pain, increase strength with ADLs and IADLs.    OT Occupational Profile and History Problem Focused Assessment - Including review of records relating to presenting problem    Occupational performance deficits (Please refer to evaluation for details): IADL's;ADL's;Leisure    Body Structure / Function / Physical Skills Strength;Decreased knowledge of use of DME;ADL;GMC;Pain;Dexterity;UE functional use;IADL;FMC;Hearing    Rehab Potential Good    Clinical Decision Making Limited treatment options, no task modification necessary    Comorbidities Affecting Occupational Performance: None    Modification or Assistance to Complete Evaluation  No modification of tasks or assist necessary to complete eval    OT Frequency 2x / week    OT Duration 8 weeks   12 visits over  8 weeks d/t any scheduling conflicts   OT Treatment/Interventions Self-care/ADL training;Iontophoresis;Cryotherapy;Paraffin;Electrical Stimulation;Ultrasound;Moist Heat;Fluidtherapy;Therapeutic exercise;Energy conservation;Manual Therapy;Patient/family education;Therapeutic activities;DME and/or AE instruction;Splinting    Plan start iontophoresis if cert back from MD, ultrasound if not, assess splints/braces if brought in for best fit and what works best    [employer] and Agree with Plan of Care Patient             Patient will benefit from skilled therapeutic intervention in order to improve the following deficits and impairments:   Body Structure / Function / Physical Skills: Strength, Decreased knowledge of use of DME, ADL, GMC, Pain, Dexterity, UE functional use, IADL, FMC, Hearing       Visit Diagnosis: Muscle weakness (generalized)  Pain in right wrist  Other instability, right hand  Pain in joint of right hand  Stiffness of right wrist, not elsewhere classified    Problem  List Patient Active Problem List   Diagnosis Date Noted   Arthritis of carpometacarpal ([place]) joint of right thumb 11/25/2020   De Quervain's tenosynovitis, right 11/25/2020   Elevated blood pressure, situational 11/23/2020   Flexor tenosynovitis of thumb 11/23/2020   OSA (obstructive sleep apnea) 09/27/2019   SOB (shortness of breath) 07/06/2019   Routine general medical examination at a health care facility 12/27/2018   Intractable chronic migraine without aura and with status migrainosus 02/06/2018   Sensorineural hearing loss (SNHL) of both ears 12/01/2017   Vertigo 12/01/2017   Vestibular migraine 12/01/2017   Noise induced tinnitus of both ears 10/06/2017   Noise-induced hearing loss of both ears 10/06/2017   Pain in left knee 04/12/2017   Low testosterone in male 06/17/2016   Arthritis 06/01/2016   Obesity (BMI 35.0-39.9 without comorbidity) 06/01/2016    Zachery Conch, OT/L 12/19/2020, 2:31 PM  [facility] [address] [place], [place], [address] Phone: [phone]   Fax:  [phone]  Name: Michael Conway MRN: [id number] Date of Birth: 09/04/1950

## 2020-12-22 ENCOUNTER — Ambulatory Visit: Payer: Medicare Other | Admitting: Occupational Therapy

## 2020-12-22 ENCOUNTER — Other Ambulatory Visit: Payer: Self-pay

## 2020-12-22 DIAGNOSIS — M25531 Pain in right wrist: Secondary | ICD-10-CM | POA: Diagnosis not present

## 2020-12-22 DIAGNOSIS — M6281 Muscle weakness (generalized): Secondary | ICD-10-CM | POA: Diagnosis not present

## 2020-12-22 DIAGNOSIS — M25341 Other instability, right hand: Secondary | ICD-10-CM

## 2020-12-22 DIAGNOSIS — M25541 Pain in joints of right hand: Secondary | ICD-10-CM

## 2020-12-22 DIAGNOSIS — M25631 Stiffness of right wrist, not elsewhere classified: Secondary | ICD-10-CM

## 2020-12-22 NOTE — Patient Instructions (Signed)
You should wear your wrist and thumb brace most of the time. When brace is off avoid repetitive wrist and thumb movements. You can use ice on your thumb and wrist wrapped in a towel for 8 mins for pain relief(however do not use on the day's you receive ionto treatment)

## 2020-12-22 NOTE — Therapy (Signed)
[facility] [address] [place], [place], [address] Phone: [phone]   Fax:  [phone]  Occupational Therapy Treatment  Patient Details  Name: Michael Conway MRN: [id number] Date of Birth: April 04, 1950 Referring Provider (OT): Lynne Leader, MD   Encounter Date: 12/22/2020   OT End of Session - 12/22/20 0914     Visit Number 2    Number of Visits 13    Date for OT Re-Evaluation 02/13/21    Authorization Type Medicare A&B/[employer]    Authorization - Visit Number 2    Progress Note Due on Visit 10    OT Start Time 0850    OT Stop Time 0925    OT Time Calculation (min) 35 min    Activity Tolerance Patient tolerated treatment well    Behavior During Therapy [facility] for tasks assessed/performed             Past Medical History:  Diagnosis Date   Allergy    Arthritis    Colon polyps    Depression    Hearing impaired person, bilateral    Hypertension    Low testosterone    Migraines    OSA (obstructive sleep apnea) 09/27/2019   Sleep apnea    uses cpap   Vestibular migraine     Past Surgical History:  Procedure Laterality Date   BICEPS TENDON REPAIR Left    INNER EAR SURGERY Right    KNEE ARTHROSCOPY Left    ROTATOR CUFF REPAIR Bilateral    TYMPANOSTOMY TUBE PLACEMENT     WISDOM TOOTH EXTRACTION      There were no vitals filed for this visit.   Subjective Assessment - 12/22/20 0930     Subjective  Pt reports wrist pain    Pertinent History Migraines, Vertigo, HOH, Arthritis    Limitations Hearing Impaired, Vertigo    Special Tests Finklestein's Test - positive    Patient Stated Goals Just wants the pain gone    Currently in Pain? Yes    Pain Location Wrist    Pain Orientation Right    Pain Descriptors / Indicators Throbbing    Pain Type Acute pain    Pain Onset More than a month ago    Pain Frequency Constant    Aggravating Factors  use    Pain Relieving Factors rest                           OT Treatments/Exercises (OP) - 12/22/20 0001       Modalities   Modalities Iontophoresis      Iontophoresis   Type of Iontophoresis Dexamethasone    Location right radial wrist    Dose 2.0 cc medium pad    Time Intensitiy 1.8-2.1, gradually increased intensity. 21 mins plus 5 mins set up time=26 , no blisters present, mild pink color at electrode sites                   OT Education - 12/22/20 1339     Education Details Iontophoresis-education sheet provided, pt reviewed, therapist discussed with pt. Education regarding splint wear and minor adjustments to stay in splint for comfort, therapist initated education regarding positions to avoid and avoiding washing dishes to minimize symptoms. Discussed use of ice for pain relief at home.   Person(s) Educated Patient    Methods Explanation;Verbal cues;Handout    Comprehension Verbalized understanding;Verbal cues required  OT Short Term Goals - 12/19/20 1412       OT SHORT TERM GOAL #1   Title Pt will be independent with initial HEP    Time 4    Period Weeks    Status New    Target Date 01/16/21      OT SHORT TERM GOAL #2   Title Pt will verbalize understanding of pain management strategies    Time 4    Period Weeks    Status New      OT SHORT TERM GOAL #3   Title Pt will demonstrate and verbalize understanding of brace and splint wear and care PRN    Time 4    Period Weeks    Status New      OT SHORT TERM GOAL #4   Title Pt will report pain no greater than 6/10 with completing HEP or during functional activities (washing/brushing hair, toilet hygiene)    Time 4    Period Weeks    Status New      OT SHORT TERM GOAL #5   Title Pt will verbalize understanding of adapted strategies in order to decrease pain while completing ADLs and IADLs.    Time 4    Period Weeks    Status New               OT Long Term Goals - 12/19/20 1415       OT LONG TERM GOAL  #1   Title Pt will be independent with any updated HEPs    Time 8    Period Weeks    Status New    Target Date 02/13/21      OT LONG TERM GOAL #2   Title Pt will increase grip strength in RUE to 100lbs or greater, dominant hand    Baseline R 90.8, L 104.9    Time 8    Period Weeks    Status New      OT LONG TERM GOAL #3   Title Pt will report pain no greater than 5/10 in RUE wrist while completing functional tasks (brushing/washing hair, toilet hygiene)    Time 8    Period Weeks    Status New      OT LONG TERM GOAL #4   Title Pt will verbalize understanding of sleep positions and report increased comfort and decreased pain of RUE    Time 8    Period Weeks    Status New                   Plan - 12/22/20 0919     Clinical Impression Statement Pt arrived today wearing his wrist splint. Therapist removed stay and bent it for increased comfort. Pt reports that the splint was uncomfortable prior to adjustment and it feels better now. Therapist reveiwed importance of wearing splint to minimize symptoms and decrease pain.    OT Occupational Profile and History Problem Focused Assessment - Including review of records relating to presenting problem    Occupational performance deficits (Please refer to evaluation for details): IADL's;ADL's;Leisure    Body Structure / Function / Physical Skills Strength;Decreased knowledge of use of DME;ADL;GMC;Pain;Dexterity;UE functional use;IADL;FMC;Hearing    Rehab Potential Good    Clinical Decision Making Limited treatment options, no task modification necessary    Comorbidities Affecting Occupational Performance: None    Modification or Assistance to Complete Evaluation  No modification of tasks or assist necessary to complete eval    OT Frequency 2x /  week    OT Duration 8 weeks   12 visits over 8 weeks d/t any scheduling conflicts   OT Treatment/Interventions Self-care/ADL training;Iontophoresis;Cryotherapy;Paraffin;Electrical  Stimulation;Ultrasound;Moist Heat;Fluidtherapy;Therapeutic exercise;Energy conservation;Manual Therapy;Patient/family education;Therapeutic activities;DME and/or AE instruction;Splinting    Plan continue to assess splint wear/ comfort, issue HEP for wrist and movement separately, continue ionto dose #2    Consulted and Agree with Plan of Care Patient             Patient will benefit from skilled therapeutic intervention in order to improve the following deficits and impairments:   Body Structure / Function / Physical Skills: Strength, Decreased knowledge of use of DME, ADL, GMC, Pain, Dexterity, UE functional use, IADL, FMC, Hearing       Visit Diagnosis: Muscle weakness (generalized)  Pain in right wrist  Other instability, right hand  Pain in joint of right hand  Stiffness of right wrist, not elsewhere classified    Problem List Patient Active Problem List   Diagnosis Date Noted   Arthritis of carpometacarpal ([place]) joint of right thumb 11/25/2020   De Quervain's tenosynovitis, right 11/25/2020   Elevated blood pressure, situational 11/23/2020   Flexor tenosynovitis of thumb 11/23/2020   OSA (obstructive sleep apnea) 09/27/2019   SOB (shortness of breath) 07/06/2019   Routine general medical examination at a health care facility 12/27/2018   Intractable chronic migraine without aura and with status migrainosus 02/06/2018   Sensorineural hearing loss (SNHL) of both ears 12/01/2017   Vertigo 12/01/2017   Vestibular migraine 12/01/2017   Noise induced tinnitus of both ears 10/06/2017   Noise-induced hearing loss of both ears 10/06/2017   Pain in left knee 04/12/2017   Low testosterone in male 06/17/2016   Arthritis 06/01/2016   Obesity (BMI 35.0-39.9 without comorbidity) 06/01/2016    Margarito Dehaas, OT/L 12/22/2020, 1:48 PM  [facility] [address] [place], [place], [address] Phone: [phone]   Fax:   [phone]  Name: OSSIE BELTRAN MRN: [id number] Date of Birth: November 27, 1950

## 2020-12-23 ENCOUNTER — Ambulatory Visit: Payer: Medicare Other | Admitting: Family Medicine

## 2020-12-24 ENCOUNTER — Ambulatory Visit: Payer: Self-pay

## 2020-12-24 ENCOUNTER — Other Ambulatory Visit: Payer: Self-pay

## 2020-12-24 ENCOUNTER — Ambulatory Visit (INDEPENDENT_AMBULATORY_CARE_PROVIDER_SITE_OTHER): Payer: Medicare Other | Admitting: Family Medicine

## 2020-12-24 ENCOUNTER — Ambulatory Visit: Payer: Medicare Other | Admitting: Occupational Therapy

## 2020-12-24 ENCOUNTER — Encounter: Payer: Self-pay | Admitting: Family Medicine

## 2020-12-24 VITALS — BP 128/78 | HR 72 | Ht 71.0 in | Wt 276.0 lb

## 2020-12-24 DIAGNOSIS — M654 Radial styloid tenosynovitis [de Quervain]: Secondary | ICD-10-CM

## 2020-12-24 DIAGNOSIS — M25531 Pain in right wrist: Secondary | ICD-10-CM

## 2020-12-24 NOTE — Progress Notes (Signed)
   I, Wendy Poet, LAT, ATC, am serving as scribe for Dr. Lynne Leader.  Michael Conway is a 70 y.o. male who presents to [place] at [facility] today for f/u of R wrist and thumb pain due to deQuervain's tenosynovitis.  He was last seen by Dr. Georgina Snell on 11/25/20 and was referred to hand therapy of which he has completed 2 sessions.  He has also been shown a HEP.  Today, pt reports that his R wrist and hand is killing him.  He's been wearing a thumb spica splint.  He's unable to do basic tasks such as brushing his hair and teeth.  Diagnostic imaging: R wrist XR- 11/25/20  Pertinent review of systems: No fevers or chills  Relevant historical information: Sleep apnea.  Obesity.   Exam:  BP 128/78 (BP Location: Left Arm, Patient Position: Sitting, Cuff Size: Large)   Pulse 72   Ht 5\' 11"  (1.803 m)   Wt 276 lb (125.2 kg)   SpO2 97%   BMI 38.49 kg/m  General: Well Developed, well nourished, and in no acute distress.   MSK: Right wrist slightly swelling at radial styloid.  Tender palpation overlying radial styloid.  Positive Finkelstein's test.    Lab and Radiology Results  Procedure: Real-time Ultrasound Guided Injection of first dorsal wrist compartment tendon sheath (de Quervain's injection) Device: Philips Affiniti 50G Images permanently stored and available for review in PACS Verbal informed consent obtained.  Discussed risks and benefits of procedure. Warned about infection bleeding damage to structures skin hypopigmentation and fat atrophy among others. Patient expresses understanding and agreement Time-out conducted.   Noted no overlying erythema, induration, or other signs of local infection.   Skin prepped in a sterile fashion.   Local anesthesia: Topical Ethyl chloride.   With sterile technique and under real time ultrasound guidance: 40 mg of Kenalog and 1 mL lidocaine injected into the tendon sheath. Fluid seen entering the tendon sheath.   Completed  without difficulty   Pain immediately resolved suggesting accurate placement of the medication.   Advised to call if fevers/chills, erythema, induration, drainage, or persistent bleeding.   Images permanently stored and available for review in the ultrasound unit.  Impression: Technically successful ultrasound guided injection.         Assessment and Plan: 70 y.o. male with right wrist pain due to significant de Quervain's tenosynovitis.  Patient failing conservative management including early trial of physical therapy and immobilization.  Plan for injection today.  Resume immobilization at home exercise program.  If not improving in a few weeks next step would be MRI for potential surgical planning.   PDMP not reviewed this encounter. Orders Placed This Encounter  Procedures   [place] LIMITED JOINT SPACE STRUCTURES UP RIGHT(NO LINKED CHARGES)    Order Specific Question:   Reason for Exam (SYMPTOM  OR DIAGNOSIS REQUIRED)    Answer:   R wrist pain    Order Specific Question:   Preferred imaging location?    Answer:   [place]   No orders of the defined types were placed in this encounter.    Discussed warning signs or symptoms. Please see discharge instructions. Patient expresses understanding.   The above documentation has been reviewed and is accurate and complete Lynne Leader, M.D.

## 2020-12-24 NOTE — Therapy (Signed)
[facility] [address] [place], [place], [address] Phone: [phone]   Fax:  [phone]  Occupational Therapy Treatment/Arrive - No Charge  Patient Details  Name: Michael Conway MRN: [id number] Date of Birth: 1951-01-04 Referring Provider (OT): Lynne Leader, MD   Encounter Date: 12/24/2020   OT End of Session - 12/24/20 1023     Visit Number 0    Number of Visits 13    Date for OT Re-Evaluation 02/13/21    Authorization Type Medicare A&B/[employer]    Authorization - Visit Number 2    Progress Note Due on Visit 10    Activity Tolerance Patient tolerated treatment well    Behavior During Therapy [facility] for tasks assessed/performed             Past Medical History:  Diagnosis Date   Allergy    Arthritis    Colon polyps    Depression    Hearing impaired person, bilateral    Hypertension    Low testosterone    Migraines    OSA (obstructive sleep apnea) 09/27/2019   Sleep apnea    uses cpap   Vestibular migraine     Past Surgical History:  Procedure Laterality Date   BICEPS TENDON REPAIR Left    INNER EAR SURGERY Right    KNEE ARTHROSCOPY Left    ROTATOR CUFF REPAIR Bilateral    TYMPANOSTOMY TUBE PLACEMENT     WISDOM TOOTH EXTRACTION      There were no vitals filed for this visit.    ON HOLD - arrive, no charge. Pt arrived today and reported increased pain in RUE wrist. Pt reports he is going to get Cortisone shot from [facility] and report back if needed further therapy. Pt wishes to cancel remaining appts at this time. Will discharge episode if patient does not report back in 30 days or sooner for continued therapy. Pt is aware that he will need to obtain new referral after 30 days if need further OT for wrist pain.                         OT Short Term Goals - 12/19/20 1412       OT SHORT TERM GOAL #1   Title Pt will be independent with initial HEP    Time 4    Period  Weeks    Status New    Target Date 01/16/21      OT SHORT TERM GOAL #2   Title Pt will verbalize understanding of pain management strategies    Time 4    Period Weeks    Status New      OT SHORT TERM GOAL #3   Title Pt will demonstrate and verbalize understanding of brace and splint wear and care PRN    Time 4    Period Weeks    Status New      OT SHORT TERM GOAL #4   Title Pt will report pain no greater than 6/10 with completing HEP or during functional activities (washing/brushing hair, toilet hygiene)    Time 4    Period Weeks    Status New      OT SHORT TERM GOAL #5   Title Pt will verbalize understanding of adapted strategies in order to decrease pain while completing ADLs and IADLs.    Time 4    Period Weeks    Status New  OT Long Term Goals - 12/19/20 1415       OT LONG TERM GOAL #1   Title Pt will be independent with any updated HEPs    Time 8    Period Weeks    Status New    Target Date 02/13/21      OT LONG TERM GOAL #2   Title Pt will increase grip strength in RUE to 100lbs or greater, dominant hand    Baseline R 90.8, L 104.9    Time 8    Period Weeks    Status New      OT LONG TERM GOAL #3   Title Pt will report pain no greater than 5/10 in RUE wrist while completing functional tasks (brushing/washing hair, toilet hygiene)    Time 8    Period Weeks    Status New      OT LONG TERM GOAL #4   Title Pt will verbalize understanding of sleep positions and report increased comfort and decreased pain of RUE    Time 8    Period Weeks    Status New                   Plan - 12/24/20 1021     Clinical Impression Statement Pt arrived today and reported increased pain in RUE wrist. Pt reports he is going to get Cortisone shot from [facility] and report back if needed further therapy. Pt wishes to cancel remaining appts at this time. Will discharge episode if patient does not report back in 30 days or sooner for continued therapy. Pt is  aware that he will need to obtain new referral after 30 days if need further OT for wrist pain.    OT Occupational Profile and History Problem Focused Assessment - Including review of records relating to presenting problem    Occupational performance deficits (Please refer to evaluation for details): IADL's;ADL's;Leisure    Body Structure / Function / Physical Skills Strength;Decreased knowledge of use of DME;ADL;GMC;Pain;Dexterity;UE functional use;IADL;FMC;Hearing    Rehab Potential Good    Clinical Decision Making Limited treatment options, no task modification necessary    Comorbidities Affecting Occupational Performance: None    Modification or Assistance to Complete Evaluation  No modification of tasks or assist necessary to complete eval    OT Frequency 2x / week    OT Duration 8 weeks   12 visits over 8 weeks d/t any scheduling conflicts   OT Treatment/Interventions Self-care/ADL training;Iontophoresis;Cryotherapy;Paraffin;Electrical Stimulation;Ultrasound;Moist Heat;Fluidtherapy;Therapeutic exercise;Energy conservation;Manual Therapy;Patient/family education;Therapeutic activities;DME and/or AE instruction;Splinting    Plan discontinue ionto at this time - pt to get cortisone shot and report back if needed. on hold.    Consulted and Agree with Plan of Care Patient             Patient will benefit from skilled therapeutic intervention in order to improve the following deficits and impairments:   Body Structure / Function / Physical Skills: Strength, Decreased knowledge of use of DME, ADL, GMC, Pain, Dexterity, UE functional use, IADL, FMC, Hearing       Visit Diagnosis: Pain in right wrist    Problem List Patient Active Problem List   Diagnosis Date Noted   Arthritis of carpometacarpal [facility]) joint of right thumb 11/25/2020   De Quervain's tenosynovitis, right 11/25/2020   Elevated blood pressure, situational 11/23/2020   Flexor tenosynovitis of thumb 11/23/2020   OSA  (obstructive sleep apnea) 09/27/2019   SOB (shortness of breath) 07/06/2019   Routine general medical  examination at a health care facility 12/27/2018   Intractable chronic migraine without aura and with status migrainosus 02/06/2018   Sensorineural hearing loss (SNHL) of both ears 12/01/2017   Vertigo 12/01/2017   Vestibular migraine 12/01/2017   Noise induced tinnitus of both ears 10/06/2017   Noise-induced hearing loss of both ears 10/06/2017   Pain in left knee 04/12/2017   Low testosterone in male 06/17/2016   Arthritis 06/01/2016   Obesity (BMI 35.0-39.9 without comorbidity) 06/01/2016    Zachery Conch, OT/L 12/24/2020, 10:23 AM  [facility] [address] [place], [place], [address] Phone: [phone]   Fax:  [phone]  Name: Michael Conway MRN: [id number] Date of Birth: 04-20-1950

## 2020-12-24 NOTE — Patient Instructions (Addendum)
Good to see you today.  You had a R wrist/thumb injection .Call or go to the ER if you develop a large red swollen joint with extreme pain or oozing puss.   Let me know if you're not doing better.  Next step is an MRI.  Wear your brace w/ heavy-duty use of your wrist and hand.  Follow-up as needed.

## 2020-12-25 ENCOUNTER — Encounter: Payer: Self-pay | Admitting: Family Medicine

## 2020-12-30 ENCOUNTER — Encounter: Payer: Medicare Other | Admitting: Occupational Therapy

## 2021-01-02 ENCOUNTER — Encounter: Payer: Medicare Other | Admitting: Occupational Therapy
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
## 2021-01-06 ENCOUNTER — Other Ambulatory Visit (INDEPENDENT_AMBULATORY_CARE_PROVIDER_SITE_OTHER): Payer: Medicare Other

## 2021-01-06 ENCOUNTER — Ambulatory Visit (INDEPENDENT_AMBULATORY_CARE_PROVIDER_SITE_OTHER): Payer: Medicare Other | Admitting: Neurology

## 2021-01-06 ENCOUNTER — Encounter: Payer: Self-pay | Admitting: Neurology

## 2021-01-06 ENCOUNTER — Other Ambulatory Visit: Payer: Self-pay

## 2021-01-06 VITALS — BP 167/97 | HR 78

## 2021-01-06 DIAGNOSIS — G43809 Other migraine, not intractable, without status migrainosus: Secondary | ICD-10-CM | POA: Diagnosis not present

## 2021-01-06 DIAGNOSIS — G43709 Chronic migraine without aura, not intractable, without status migrainosus: Secondary | ICD-10-CM | POA: Diagnosis not present

## 2021-01-06 DIAGNOSIS — R413 Other amnesia: Secondary | ICD-10-CM

## 2021-01-06 DIAGNOSIS — G4733 Obstructive sleep apnea (adult) (pediatric): Secondary | ICD-10-CM | POA: Diagnosis not present

## 2021-01-06 DIAGNOSIS — H903 Sensorineural hearing loss, bilateral: Secondary | ICD-10-CM

## 2021-01-06 LAB — VITAMIN B12: Vitamin B-12: 208 pg/mL — ABNORMAL LOW (ref 211–911)

## 2021-01-06 LAB — TSH: TSH: 6.54 u[IU]/mL — ABNORMAL HIGH (ref 0.35–5.50)

## 2021-01-06 MED ORDER — VENLAFAXINE HCL ER 37.5 MG PO CP24: ORAL_CAPSULE | ORAL | 0 refills | Status: DC

## 2021-01-06 NOTE — Patient Instructions (Signed)
In addition to Botox, start venlafaxine - take as prescribed For memory, will check B12, TSH and neurocognitive testing Rizatriptan as needed.  Limit use of pain relievers to no more than 2 days out of week to prevent risk of rebound or medication-overuse headache. Follow up in office visit in one year or sooner if needed.

## 2021-01-07 ENCOUNTER — Other Ambulatory Visit: Payer: Self-pay

## 2021-01-07 ENCOUNTER — Encounter (HOSPITAL_COMMUNITY): Payer: Self-pay

## 2021-01-07 ENCOUNTER — Telehealth: Payer: Self-pay

## 2021-01-07 ENCOUNTER — Emergency Department (HOSPITAL_COMMUNITY): Payer: No Typology Code available for payment source

## 2021-01-07 ENCOUNTER — Emergency Department (HOSPITAL_COMMUNITY)
Admission: EM | Admit: 2021-01-07 | Discharge: 2021-01-08 | Disposition: A | Discharge status: Home / Self Care | Payer: No Typology Code available for payment source | Attending: Emergency Medicine | Admitting: Emergency Medicine

## 2021-01-07 DIAGNOSIS — R110 Nausea: Secondary | ICD-10-CM | POA: Diagnosis not present

## 2021-01-07 DIAGNOSIS — R61 Generalized hyperhidrosis: Secondary | ICD-10-CM | POA: Insufficient documentation

## 2021-01-07 DIAGNOSIS — R072 Precordial pain: Secondary | ICD-10-CM | POA: Diagnosis not present

## 2021-01-07 DIAGNOSIS — R079 Chest pain, unspecified: Secondary | ICD-10-CM | POA: Diagnosis not present

## 2021-01-07 DIAGNOSIS — Z87891 Personal history of nicotine dependence: Secondary | ICD-10-CM | POA: Diagnosis not present

## 2021-01-07 DIAGNOSIS — I10 Essential (primary) hypertension: Secondary | ICD-10-CM | POA: Diagnosis not present

## 2021-01-07 DIAGNOSIS — J9859 Other diseases of mediastinum, not elsewhere classified: Secondary | ICD-10-CM

## 2021-01-07 DIAGNOSIS — Z86010 Personal history of colonic polyps: Secondary | ICD-10-CM | POA: Insufficient documentation

## 2021-01-07 DIAGNOSIS — R0602 Shortness of breath: Secondary | ICD-10-CM | POA: Diagnosis not present

## 2021-01-07 DIAGNOSIS — Z79899 Other long term (current) drug therapy: Secondary | ICD-10-CM | POA: Diagnosis not present

## 2021-01-07 DIAGNOSIS — D7389 Other diseases of spleen: Secondary | ICD-10-CM | POA: Diagnosis not present

## 2021-01-07 DIAGNOSIS — M47816 Spondylosis without myelopathy or radiculopathy, lumbar region: Secondary | ICD-10-CM | POA: Diagnosis not present

## 2021-01-07 DIAGNOSIS — I2510 Atherosclerotic heart disease of native coronary artery without angina pectoris: Secondary | ICD-10-CM | POA: Diagnosis not present

## 2021-01-07 DIAGNOSIS — N400 Enlarged prostate without lower urinary tract symptoms: Secondary | ICD-10-CM | POA: Diagnosis not present

## 2021-01-07 LAB — BASIC METABOLIC PANEL
Anion gap: 7 (ref 5–15)
BUN: 23 mg/dL (ref 8–23)
CO2: 25 mmol/L (ref 22–32)
Calcium: 8.9 mg/dL (ref 8.9–10.3)
Chloride: 103 mmol/L (ref 98–111)
Creatinine, Ser: 1.15 mg/dL (ref 0.61–1.24)
GFR, Estimated: >60 mL/min (ref >60)
Glucose, Bld: 100 mg/dL — ABNORMAL HIGH (ref 70–99)
Potassium: 4.7 mmol/L (ref 3.5–5.1)
Sodium: 135 mmol/L (ref 135–145)

## 2021-01-07 LAB — CBC
HCT: 51 % (ref 39.0–52.0)
Hemoglobin: 17.7 g/dL — ABNORMAL HIGH (ref 13.0–17.0)
MCH: 30.5 pg (ref 26.0–34.0)
MCHC: 34.7 g/dL (ref 30.0–36.0)
MCV: 87.9 fL (ref 80.0–100.0)
Platelets: 191 10*3/uL (ref 150–400)
RBC: 5.8 MIL/uL (ref 4.22–5.81)
RDW: 13.6 % (ref 11.5–15.5)
WBC: 16.7 10*3/uL — ABNORMAL HIGH (ref 4.0–10.5)
nRBC: 0 % (ref 0.0–0.2)

## 2021-01-07 LAB — TROPONIN I (HIGH SENSITIVITY): Troponin I (High Sensitivity): 6 ng/L (ref <18)

## 2021-01-07 MED ORDER — IOHEXOL 350 MG/ML SOLN
80.0000 mL | Freq: Once | INTRAVENOUS | Status: AC | PRN
  Administered 2021-01-07: 80 mL via INTRAVENOUS

## 2021-01-07 MED ORDER — ASPIRIN 325 MG PO TABS
325.0000 mg | ORAL_TABLET | Freq: Every day | ORAL | Status: DC
  Filled 2021-01-07: qty 1

## 2021-01-07 MED ORDER — ONDANSETRON HCL 4 MG/2ML IJ SOLN
4.0000 mg | Freq: Once | INTRAMUSCULAR | Status: AC
  Administered 2021-01-07: 4 mg via INTRAVENOUS
  Filled 2021-01-07: qty 2

## 2021-01-07 MED ORDER — NITROGLYCERIN 0.4 MG SL SUBL
0.4000 mg | SUBLINGUAL_TABLET | SUBLINGUAL | Status: DC | PRN
  Administered 2021-01-07 (×2): 0.4 mg via SUBLINGUAL
  Filled 2021-01-07: qty 1

## 2021-01-07 NOTE — ED Triage Notes (Addendum)
Pt presents with chest pain that started about 3 hours ago that has started radiating up to his neck and jaw. Pt is diaphoretic and reports feeling like his extremities are very cold.

## 2021-01-07 NOTE — Telephone Encounter (Signed)
Tried calling pt, No answer. LMOVM 

## 2021-01-07 NOTE — ED Notes (Signed)
Patient placed on 2L to decrease work of breathing.

## 2021-01-07 NOTE — ED Provider Notes (Signed)
11:47 PM  Care assumed from Dr. Dennis Bast, patient with chest pain radiating to the neck, normal initial troponin, unremarkable initial ECG.  He is pending CT angiogram of the chest to rule out dissection, and repeat troponin.  1:30 AM CT scan shows an anterior mediastinal mass.  Patient's pain has improved slightly and he does not seem to be in any distress currently.  It is unclear if his pain is related to the mass.  Repeat troponin is pending.  3:03 AM Repeat troponin is unchanged.  He got good pain relief with morphine.  He is discharged with prescription for hydrocodone-acetaminophen, given ambulatory referral to cardiothoracic surgery.  Results for orders placed or performed during the hospital encounter of 60/10/93  Basic metabolic panel  Result Value Ref Range   Sodium 135 135 - 145 mmol/L   Potassium 4.7 3.5 - 5.1 mmol/L   Chloride 103 98 - 111 mmol/L   CO2 25 22 - 32 mmol/L   Glucose, Bld 100 (H) 70 - 99 mg/dL   BUN 23 8 - 23 mg/dL   Creatinine, Ser 1.15 0.61 - 1.24 mg/dL   Calcium 8.9 8.9 - 10.3 mg/dL   GFR, Estimated >60 >60 mL/min   Anion gap 7 5 - 15  CBC  Result Value Ref Range   WBC 16.7 (H) 4.0 - 10.5 K/uL   RBC 5.80 4.22 - 5.81 MIL/uL   Hemoglobin 17.7 (H) 13.0 - 17.0 g/dL   HCT 51.0 39.0 - 52.0 %   MCV 87.9 80.0 - 100.0 fL   MCH 30.5 26.0 - 34.0 pg   MCHC 34.7 30.0 - 36.0 g/dL   RDW 13.6 11.5 - 15.5 %   Platelets 191 150 - 400 K/uL   nRBC 0.0 0.0 - 0.2 %  Troponin I (High Sensitivity)  Result Value Ref Range   Troponin I (High Sensitivity) 6 <18 ng/L  Troponin I (High Sensitivity)  Result Value Ref Range   Troponin I (High Sensitivity) 6 <18 ng/L   DG Chest 2 View  Result Date: 01/07/2021 CLINICAL DATA:  Chest pain for 3 hours, pain radiating to neck and jaw, diaphoresis EXAM: CHEST - 2 VIEW COMPARISON:  05/06/2015 FINDINGS: Frontal and lateral views of the chest demonstrate mild enlargement the cardiac silhouette. No airspace disease, effusion, or  pneumothorax. No acute bony abnormalities. IMPRESSION: 1. Enlarged cardiac silhouette. 2. No acute airspace disease. Electronically Signed   By: Randa Ngo M.D.   On: 01/07/2021 21:31   [place] LIMITED JOINT SPACE STRUCTURES UP RIGHT(NO LINKED CHARGES)  Result Date: 12/26/2020 Procedure: Real-time Ultrasound Guided Injection of first dorsal wrist compartment tendon sheath (de Quervain's injection) Device: Philips Affiniti 50G Images permanently stored and available for review in PACS Verbal informed consent obtained.  Discussed risks and benefits of procedure. Warned about infection bleeding damage to structures skin hypopigmentation and fat atrophy among others. Patient expresses understanding and agreement Time-out conducted.   Noted no overlying erythema, induration, or other signs of local infection.   Skin prepped in a sterile fashion.   Local anesthesia: Topical Ethyl chloride.   With sterile technique and under real time ultrasound guidance: 40 mg of Kenalog and 1 mL lidocaine injected into the tendon sheath. Fluid seen entering the tendon sheath.   Completed without difficulty   Pain immediately resolved suggesting accurate placement of the medication.   Advised to call if fevers/chills, erythema, induration, drainage, or persistent bleeding.   Images permanently stored and available for review in the ultrasound unit. Impression:  Technically successful ultrasound guided injection.    CT Angio Chest/Abd/Pel for Dissection W and/or Wo Contrast  Result Date: 01/08/2021 CLINICAL DATA:  Chest pain with radiation to the neck, diaphoresis, extremity coolness EXAM: CT ANGIOGRAPHY CHEST, ABDOMEN AND PELVIS TECHNIQUE: Non-contrast CT of the chest was initially obtained. Multidetector CT imaging through the chest, abdomen and pelvis was performed using the standard protocol during bolus administration of intravenous contrast. Multiplanar reconstructed images and MIPs were obtained and reviewed to evaluate the  vascular anatomy. CONTRAST:  12mL OMNIPAQUE IOHEXOL 350 MG/ML SOLN COMPARISON:  None. FINDINGS: CTA CHEST FINDINGS Cardiovascular: The thoracic aorta is of normal caliber. No intramural hematoma, dissection, or aneurysm. Mild atherosclerotic calcification. Mild coronary artery calcification. Global cardiac size within normal limits. No pericardial effusion. The central pulmonary arteries are of normal caliber. Mediastinum/Nodes: A lobulated hyperdense mass is seen within the anterior mediastinum measuring 4.6 x 6.2 cm at axial image # 33/6. This lesion demonstrates no significant enhancement when compared to precontrast imaging and, while a soft tissue mass such as a thymoma and less likely a nodal mass is favored, a proteinaceous cyst is also within the differential though this would be an unusual location and contour. The visualized thyroid is unremarkable. No additional pathologic thoracic adenopathy. The esophagus is unremarkable. Lungs/Pleura: Lungs are clear. No pleural effusion or pneumothorax. Musculoskeletal: No acute bone abnormality. No lytic or blastic bone lesion. Review of the MIP images confirms the above findings. CTA ABDOMEN AND PELVIS FINDINGS VASCULAR Aorta: Mild atherosclerotic calcification. Normal caliber. No aneurysm or dissection. No periaortic inflammatory change. Celiac: Patent without evidence of aneurysm, dissection, vasculitis or significant stenosis. SMA: Patent without evidence of aneurysm, dissection, vasculitis or significant stenosis. Renals: Both renal arteries are patent without evidence of aneurysm, dissection, vasculitis, fibromuscular dysplasia or significant stenosis. IMA: Patent without evidence of aneurysm, dissection, vasculitis or significant stenosis. Inflow: Patent without evidence of aneurysm, dissection, vasculitis or significant stenosis. Veins: No obvious venous abnormality within the limitations of this arterial phase study. Review of the MIP images confirms the  above findings. NON-VASCULAR Hepatobiliary: No focal liver abnormality is seen. No gallstones, gallbladder wall thickening, or biliary dilatation. Pancreas: Unremarkable Spleen: A 4.1 cm low-attenuation lesion is seen within the medial pole of the spleen, not well characterized on this examination. This may be artifactual and relate to the relatively early phase of enhancement. Splenic size is within normal limits. Adrenals/Urinary Tract: Adrenal glands are unremarkable. Kidneys are normal, without renal calculi, focal lesion, or hydronephrosis. Bladder is unremarkable. Stomach/Bowel: Stomach is within normal limits. Appendix appears normal. No evidence of bowel wall thickening, distention, or inflammatory changes. Lymphatic: No pathologic adenopathy within the abdomen and pelvis. Reproductive: Moderate prostatic enlargement. Other: No abdominal wall hernia or abnormality. No abdominopelvic ascites. Musculoskeletal: Degenerative changes are seen within the lumbar spine. No acute bone abnormality. No lytic or blastic bone lesion. Review of the MIP images confirms the above findings. IMPRESSION: No evidence of thoracic aortic aneurysm or dissection. Mild atherosclerotic calcification noted throughout the thoracoabdominal aorta. No hemodynamically significant stenosis. 4.6 by 6.2 cm lobulated hyperdense lesion within the anterior mediastinum. While a solid mass, such as a thymoma or less likely lymphoma is favored, a hyperdense cyst is within the differential considerations. Contrast enhanced MRI examination may be helpful to differentiate solid from cystic nature of the lesion and better assess for subtle enhancement. 4.1 cm low-attenuation lesion within the spleen, possibly representing a pseudo lesion secondary to the relatively early phase of enhancement. Mild coronary artery calcification  No acute intrathoracic or intra-abdominal pathology identified. Aortic Atherosclerosis (ICD10-I70.0). Electronically Signed    By: Fidela Salisbury M.D.   On: 01/08/2021 00:29    Images viewed by me.    Delora Fuel, MD 49/20/10 [phone]

## 2021-01-07 NOTE — ED Provider Notes (Signed)
[place] DEPT Provider Note   CSN: [id number] Arrival date & time: 01/07/21  2101     History Chief Complaint  Patient presents with   Chest Pain    Michael Conway is a 70 y.o. male.   Chest Pain Pain location:  Substernal area Pain quality: crushing and pressure   Pain radiates to:  Neck Pain severity:  Severe Duration:  4 hours Timing:  Constant Progression:  Improving Chronicity:  New Context: breathing and at rest   Relieved by:  Nothing Worsened by:  Nothing Ineffective treatments:  Rest Associated symptoms: diaphoresis, nausea and shortness of breath   Associated symptoms: no abdominal pain, no altered mental status, no back pain, no dizziness, no fatigue, no fever, no heartburn, no numbness and no vomiting    70 year old male with past medical history below who presents to the emergency department with sudden onset crushing substernal chest pain radiating to his left neck with associated diaphoresis that began around 4 hours prior to arrival.  The patient states he has no history of recent smoking.  He has no history of hypertension.  He states that he was previously in his normal state of health and the pain came on.  He denies any history of anginal symptoms.  Past Medical History:  Diagnosis Date   Allergy    Arthritis    Colon polyps    Depression    Hearing impaired person, bilateral    Hypertension    Low testosterone    Migraines    OSA (obstructive sleep apnea) 09/27/2019   Sleep apnea    uses cpap   Vestibular migraine     Patient Active Problem List   Diagnosis Date Noted   Arthritis of carpometacarpal [facility]) joint of right thumb 11/25/2020   De Quervain's tenosynovitis, right 11/25/2020   Elevated blood pressure, situational 11/23/2020   Flexor tenosynovitis of thumb 11/23/2020   OSA (obstructive sleep apnea) 09/27/2019   SOB (shortness of breath) 07/06/2019   Routine general medical examination at a health  care facility 12/27/2018   Intractable chronic migraine without aura and with status migrainosus 02/06/2018   Sensorineural hearing loss (SNHL) of both ears 12/01/2017   Vertigo 12/01/2017   Vestibular migraine 12/01/2017   Noise induced tinnitus of both ears 10/06/2017   Noise-induced hearing loss of both ears 10/06/2017   Pain in left knee 04/12/2017   Low testosterone in male 06/17/2016   Arthritis 06/01/2016   Obesity (BMI 35.0-39.9 without comorbidity) 06/01/2016    Past Surgical History:  Procedure Laterality Date   BICEPS TENDON REPAIR Left    INNER EAR SURGERY Right    KNEE ARTHROSCOPY Left    ROTATOR CUFF REPAIR Bilateral    TYMPANOSTOMY TUBE PLACEMENT     WISDOM TOOTH EXTRACTION         Family History  Problem Relation Age of Onset   Prostate cancer Other    Heart attack Mother 79   Heart failure Mother    Hypertension Mother    Dementia Father 56   Parkinsonism Father    Colon polyps Father    Lung cancer Sister    Melanoma Sister    Lung disease Sister    Hypertension Sister    Colon cancer Neg Hx    Stomach cancer Neg Hx    Rectal cancer Neg Hx    Esophageal cancer Neg Hx     Social History   Tobacco Use   Smoking status: Former  Types: Cigarettes    Quit date: 2002    Years since quitting: 20.7   Smokeless tobacco: Never  Vaping Use   Vaping Use: Never used  Substance Use Topics   Alcohol use: Yes    Comment: occasionally   Drug use: No    Home Medications Prior to Admission medications   Medication Sig Start Date End Date Taking? Authorizing Provider  Cholecalciferol (VITAMIN D3) 125 MCG (5000 UT) CAPS Take 5,000 Units by mouth every 3 (three) days.   Yes [provider]  Cyanocobalamin (VITAMIN B-12) 3000 MCG SUBL Take 3,000 mcg by mouth daily. 03/09/19  Yes [provider]  folic acid (FOLVITE) 1 MG tablet Take 1 mg by mouth daily. 02/15/18  Yes [provider]  Magnesium 500 MG TABS Take 500 mg by mouth  daily.   Yes [provider]  Multiple Vitamins-Minerals (CENTRUM SILVER 50+MEN PO) Take 1 tablet by mouth daily with breakfast.   Yes [provider]  naproxen sodium (ALEVE) 220 MG tablet Take 220-440 mg by mouth 2 (two) times daily as needed (for headaches or migraines).   Yes [provider]  OnabotulinumtoxinA (BOTOX IJ) Inject as directed every 3 (three) months.   Yes [provider]  tamsulosin (FLOMAX) 0.4 MG CAPS capsule Take 1 capsule (0.4 mg total) by mouth daily. 11/21/20  Yes Biagio Borg, MD  testosterone cypionate (DEPOTESTOSTERONE CYPIONATE) 200 MG/ML injection Inject 200 mg into the muscle every 14 (fourteen) days. 07/17/20  Yes [provider]  TYLENOL 500 MG tablet Take 500-1,000 mg by mouth every 6 (six) hours as needed (for headaches or migraines).   Yes [provider]  vitamin E 45 MG (100 UNITS) capsule Take 100 Units by mouth daily.   Yes [provider]  Whey Protein POWD Take 1 Scoop by mouth See admin instructions. Mix 1 scoopful into preferred beverage and drink by mouth once a day   Yes [provider]  Zinc 50 MG TABS Take 50 mg by mouth daily. 03/09/19  Yes [provider]  predniSONE (DELTASONE) 10 MG tablet 3 tabs by mouth per day for 3 days,2tabs per day for 3 days,1tab per day for 3 days Patient not taking: No sig reported 11/21/20   Biagio Borg, MD  rizatriptan (MAXALT) 10 MG tablet Take 1 tablet earliest onset of migraine.  May repeat in 2 hours if needed.  Maximum 2 tablets in 24 hours Patient not taking: No sig reported 05/16/19   Pieter Partridge, DO  venlafaxine XR (EFFEXOR XR) 37.5 MG 24 hr capsule Take 1 capsule daily with breakfast for one week, then increase to 2 capsules daily with breakfast. Patient not taking: No sig reported 01/06/21   Pieter Partridge, DO    Allergies    Peppermint oil and Other  Review of Systems   Review of Systems  Constitutional:  Positive for  diaphoresis. Negative for fatigue and fever.  Respiratory:  Positive for shortness of breath.   Cardiovascular:  Positive for chest pain.  Gastrointestinal:  Positive for nausea. Negative for abdominal pain, heartburn and vomiting.  Musculoskeletal:  Negative for back pain.  Neurological:  Negative for dizziness and numbness.  All other systems reviewed and are negative.  Physical Exam Updated Vital Signs BP 123/69   Pulse 86   Temp 98.1 F (36.7 C) (Oral)   Resp (!) 34   Ht 5\' 11"  (1.803 m)   Wt 124.3 kg   SpO2 93%  BMI 38.22 kg/m   Physical Exam Vitals and nursing note reviewed.  Constitutional:      General: He is not in acute distress.    Appearance: He is well-developed. He is ill-appearing and diaphoretic.     Comments: Uncomfortable appearing, clutching his chest  HENT:     Head: Normocephalic and atraumatic.  Eyes:     Conjunctiva/sclera: Conjunctivae normal.     Pupils: Pupils are equal, round, and reactive to light.  Cardiovascular:     Rate and Rhythm: Normal rate and regular rhythm.     Pulses:          Radial pulses are 2+ on the right side and 2+ on the left side.     Heart sounds: Normal heart sounds. No murmur heard.    Comments: Radial pulses equal and symmetric bilaterally Pulmonary:     Effort: Pulmonary effort is normal. No respiratory distress.     Breath sounds: Normal breath sounds.  Abdominal:     General: There is no distension.     Palpations: Abdomen is soft.     Tenderness: There is no abdominal tenderness. There is no guarding.  Musculoskeletal:        General: No deformity or signs of injury.     Cervical back: Normal range of motion and neck supple.     Right lower leg: No edema.     Left lower leg: No edema.  Skin:    General: Skin is warm.     Findings: No lesion or rash.  Neurological:     General: No focal deficit present.     Mental Status: He is alert. Mental status is at baseline.    ED Results / Procedures / Treatments    Labs (all labs ordered are listed, but only abnormal results are displayed) Labs Reviewed  BASIC METABOLIC PANEL - Abnormal; Notable for the following components:      Result Value   Glucose, Bld 100 (*)    All other components within normal limits  CBC - Abnormal; Notable for the following components:   WBC 16.7 (*)    Hemoglobin 17.7 (*)    All other components within normal limits  TROPONIN I (HIGH SENSITIVITY)  TROPONIN I (HIGH SENSITIVITY)    EKG EKG Interpretation  Date/Time:  Wednesday January 07 2021 21:05:26 EDT Ventricular Rate:  95 PR Interval:  152 QRS Duration: 88 QT Interval:  332 QTC Calculation: 418 R Axis:   93 Text Interpretation: Sinus rhythm Right axis deviation Borderline T wave abnormalities Confirmed by Regan Lemming ([id number]) on 01/07/2021 10:39:41 PM  Radiology DG Chest 2 View  Result Date: 01/07/2021 CLINICAL DATA:  Chest pain for 3 hours, pain radiating to neck and jaw, diaphoresis EXAM: CHEST - 2 VIEW COMPARISON:  05/06/2015 FINDINGS: Frontal and lateral views of the chest demonstrate mild enlargement the cardiac silhouette. No airspace disease, effusion, or pneumothorax. No acute bony abnormalities. IMPRESSION: 1. Enlarged cardiac silhouette. 2. No acute airspace disease. Electronically Signed   By: Randa Ngo M.D.   On: 01/07/2021 21:31    Procedures Procedures   Medications Ordered in ED Medications  aspirin tablet 325 mg (has no administration in time range)  nitroGLYCERIN (NITROSTAT) SL tablet 0.4 mg (0.4 mg Sublingual Given 01/07/21 2308)  ondansetron (ZOFRAN) injection 4 mg (4 mg Intravenous Given 01/07/21 2318)    ED Course  I have reviewed the triage vital signs and the nursing notes.  Pertinent labs & imaging results that were available  during my care of the patient were reviewed by me and considered in my medical decision making (see chart for details).    MDM Rules/Calculators/A&P                           70 year old  male presenting to the emergency department with crushing substernal chest pain with radiation to his left neck that began roughly 4 hours ago.  On arrival, patient was diaphoretic, uncomfortable, clutching his left chest, no acute neurologic deficit, symmetric radial pulses bilaterally.  Differential diagnosis includes ACS, PE, aortic dissection, Boerhaave's.  Will obtain CTA chest abdomen pelvis to evaluate for dissection and delta troponins to evaluate for ACS.  Initial EKG with nonspecific T wave changes, no STEMI.  Patient was administered 325 mg of aspirin and sublingual nitroglycerin and Zofran for nausea.  Work-up initiated and pending at time of signout.  Signout provided to Dr. Roxanne Mins at [id number].  Final Clinical Impression(s) / ED Diagnoses Final diagnoses:  None    Rx / DC Orders ED Discharge Orders     None        Regan Lemming, MD 01/07/21 2340

## 2021-01-07 NOTE — Telephone Encounter (Signed)
-----   Message from Pieter Partridge, DO sent at 01/06/2021  3:48 PM EDT ----- Vitamin B12 level is low.  Recommend starting over the counter B12 1032mcg daily.  Repeat level in 6 months.  TSH is slightly elevated, possibly indicating low thyroid function.  Would like to check free T4 and free T3.

## 2021-01-08 ENCOUNTER — Encounter: Payer: Medicare Other | Admitting: Occupational Therapy

## 2021-01-08 LAB — TROPONIN I (HIGH SENSITIVITY): Troponin I (High Sensitivity): 6 ng/L (ref <18)

## 2021-01-08 MED ORDER — HYDROCODONE-ACETAMINOPHEN 5-325 MG PO TABS: 1.0000 | ORAL_TABLET | ORAL | 0 refills | Status: DC | PRN

## 2021-01-08 MED ORDER — HYDROCODONE-ACETAMINOPHEN 5-325 MG PO TABS
1.0000 | ORAL_TABLET | Freq: Once | ORAL | Status: AC
  Administered 2021-01-08: 1 via ORAL
  Filled 2021-01-08: qty 1

## 2021-01-08 MED ORDER — MORPHINE SULFATE (PF) 4 MG/ML IV SOLN
4.0000 mg | Freq: Once | INTRAVENOUS | Status: AC
  Administered 2021-01-08: 4 mg via INTRAVENOUS
  Filled 2021-01-08: qty 1

## 2021-01-08 NOTE — Discharge Instructions (Addendum)
Your evaluation did not show any sign of any heart problem.  However, your CT scan did show a mass in your mediastinum (inside your chest, above and in front of the heart).  He will need further evaluation to see what exactly this mass is, and how it needs to be managed.  Please follow-up with the cardiothoracic surgeon.  You may take ibuprofen, naproxen, or acetaminophen as needed for pain.  You may combine acetaminophen with either ibuprofen or naproxen to get additional pain relief.  Reserve hydrocodone-acetaminophen for severe pain.  Return if your symptoms are getting worse.

## 2021-01-09 ENCOUNTER — Encounter: Payer: Self-pay | Admitting: Internal Medicine

## 2021-01-12 ENCOUNTER — Encounter: Payer: Medicare Other | Admitting: Occupational Therapy

## 2021-01-12 ENCOUNTER — Telehealth: Payer: Self-pay

## 2021-01-12 DIAGNOSIS — E0590 Thyrotoxicosis, unspecified without thyrotoxic crisis or storm: Secondary | ICD-10-CM

## 2021-01-12 DIAGNOSIS — R6889 Other general symptoms and signs: Secondary | ICD-10-CM

## 2021-01-12 DIAGNOSIS — R7989 Other specified abnormal findings of blood chemistry: Secondary | ICD-10-CM

## 2021-01-12 DIAGNOSIS — E559 Vitamin D deficiency, unspecified: Secondary | ICD-10-CM

## 2021-01-12 NOTE — Telephone Encounter (Signed)
Pt called in returning a call about results 

## 2021-01-12 NOTE — Telephone Encounter (Signed)
Labs ordered.

## 2021-01-12 NOTE — Telephone Encounter (Signed)
-----   Message from Pieter Partridge, DO sent at 01/06/2021  3:48 PM EDT ----- Vitamin B12 level is low.  Recommend starting over the counter B12 1011mcg daily.  Repeat level in 6 months.  TSH is slightly elevated, possibly indicating low thyroid function.  Would like to check free T4 and free T3.

## 2021-01-12 NOTE — Telephone Encounter (Signed)
-----   Message from Pieter Partridge, DO sent at 01/06/2021  3:48 PM EDT ----- Vitamin B12 level is low.  Recommend starting over the counter B12 109mcg daily.  Repeat level in 6 months.  TSH is slightly elevated, possibly indicating low thyroid function.  Would like to check free T4 and free T3.

## 2021-01-12 NOTE — Telephone Encounter (Signed)
Called left voice mail message for patient to return call to the office for lab results.

## 2021-01-13 ENCOUNTER — Other Ambulatory Visit: Payer: Self-pay | Admitting: *Deleted

## 2021-01-13 ENCOUNTER — Institutional Professional Consult (permissible substitution) (INDEPENDENT_AMBULATORY_CARE_PROVIDER_SITE_OTHER): Payer: Medicare Other | Admitting: Thoracic Surgery (Cardiothoracic Vascular Surgery)

## 2021-01-13 ENCOUNTER — Other Ambulatory Visit: Payer: Self-pay

## 2021-01-13 ENCOUNTER — Encounter: Payer: Self-pay | Admitting: Thoracic Surgery (Cardiothoracic Vascular Surgery)

## 2021-01-13 DIAGNOSIS — J9859 Other diseases of mediastinum, not elsewhere classified: Secondary | ICD-10-CM

## 2021-01-13 DIAGNOSIS — Z01818 Encounter for other preprocedural examination: Secondary | ICD-10-CM

## 2021-01-13 NOTE — Progress Notes (Signed)
PCP is Hoyt Koch, MD Referring Provider is Delora Fuel, MD  Chief Complaint  Patient presents with  . Mediastinal Mass    New patient consultation CTA C/A/P 01/07/21    HPI:   Past Medical History:  Diagnosis Date  . Allergy   . Arthritis   . Colon polyps   . Depression   . Hearing impaired person, bilateral   . Hypertension   . Low testosterone   . Migraines   . OSA (obstructive sleep apnea) 09/27/2019  . Sleep apnea    uses cpap  . Vestibular migraine     Past Surgical History:  Procedure Laterality Date  . BICEPS TENDON REPAIR Left   . INNER EAR SURGERY Right   . KNEE ARTHROSCOPY Left   . ROTATOR CUFF REPAIR Bilateral   . TYMPANOSTOMY TUBE PLACEMENT    . WISDOM TOOTH EXTRACTION      Family History  Problem Relation Age of Onset  . Prostate cancer Other   . Heart attack Mother 34  . Heart failure Mother   . Hypertension Mother   . Dementia Father 72  . Parkinsonism Father   . Colon polyps Father   . Lung cancer Sister   . Melanoma Sister   . Lung disease Sister   . Hypertension Sister   . Colon cancer Neg Hx   . Stomach cancer Neg Hx   . Rectal cancer Neg Hx   . Esophageal cancer Neg Hx     Social History Social History   Tobacco Use  . Smoking status: Former    Types: Cigarettes    Quit date: 2002    Years since quitting: 20.8  . Smokeless tobacco: Never  Vaping Use  . Vaping Use: Never used  Substance Use Topics  . Alcohol use: Yes    Comment: occasionally  . Drug use: No    Current Outpatient Medications  Medication Sig Dispense Refill  . Cholecalciferol (VITAMIN D3) 125 MCG (5000 UT) CAPS Take 5,000 Units by mouth every 3 (three) days.    . Cyanocobalamin (VITAMIN B-12) 3000 MCG SUBL Take 3,000 mcg by mouth daily.    . folic acid (FOLVITE) 1 MG tablet Take 1 mg by mouth daily.    Marland Kitchen HYDROcodone-acetaminophen (NORCO) 5-325 MG tablet Take 1 tablet by mouth every 4 (four) hours as needed for moderate pain. 15 tablet 0  .  Magnesium 500 MG TABS Take 500 mg by mouth daily.    . Multiple Vitamins-Minerals (CENTRUM SILVER 50+MEN PO) Take 1 tablet by mouth daily with breakfast.    . naproxen sodium (ALEVE) 220 MG tablet Take 220-440 mg by mouth 2 (two) times daily as needed (for headaches or migraines).    . OnabotulinumtoxinA (BOTOX IJ) Inject as directed every 3 (three) months.    . tamsulosin (FLOMAX) 0.4 MG CAPS capsule Take 1 capsule (0.4 mg total) by mouth daily. 90 capsule 3  . testosterone cypionate (DEPOTESTOSTERONE CYPIONATE) 200 MG/ML injection Inject 200 mg into the muscle every 14 (fourteen) days.    . TYLENOL 500 MG tablet Take 500-1,000 mg by mouth every 6 (six) hours as needed (for headaches or migraines).    . venlafaxine XR (EFFEXOR XR) 37.5 MG 24 hr capsule Take 1 capsule daily with breakfast for one week, then increase to 2 capsules daily with breakfast. 60 capsule 0  . vitamin E 45 MG (100 UNITS) capsule Take 100 Units by mouth daily.    . Whey Protein POWD Take 1 Scoop  by mouth See admin instructions. Mix 1 scoopful into preferred beverage and drink by mouth once a day    . Zinc 50 MG TABS Take 50 mg by mouth daily.     No current facility-administered medications for this visit.    Allergies  Allergen Reactions  . Peppermint Oil Shortness Of Breath  . Other Other (See Comments)    Unnamed anesthesia- Caused a feeling of aggression and the patient could not wake up well from it (had to be kept overnight at the hospital)    Review of Systems  BP (!) 169/104 (BP Location: Right Arm, Patient Position: Sitting, Cuff Size: Large)   Pulse (!) 105   Resp 20   Ht 5\' 11"  (1.803 m)   Wt 274 lb (124.3 kg)   SpO2 96% Comment: RA  BMI 38.22 kg/m  Physical Exam   Diagnostic Tests:   Impression:   Plan:   Michael Nakayama, MD [facility] [phone]

## 2021-01-13 NOTE — Progress Notes (Signed)
PCP is Hoyt Koch, MD Referring Provider is Delora Fuel, MD  Chief Complaint  Patient presents with   Mediastinal Mass    New patient consultation CTA C/A/P 01/07/21    HPI: [name] is sent for consultation regarding an anterior mediastinal mass.  Michael Conway is a 70 year old retired gentleman with a past medical history significant for hypertension, arthritis, obesity, obstructive sleep apnea, migraines, depression, and low testosterone.  He was in his usual state of health until about 6 days ago when he had sudden onset of chest pain.  He described this as a sensation of indigestion but with some radiation to his neck.  He went to bed that is pain just would not resolve and finally he went to the emergency room.  He ruled out for MI.  A CT angiogram of the chest abdomen and pelvis was performed.  It revealed an anterior mediastinal mass.  Since has been having some chest discomfort and shortness of breath with exertion for the last couple of years.  That has been more significant recently.  He has had a cardiac work-up in the past that was negative.  He is a former smoker having quit 20 years ago.  Zubrod Score: At the time of surgery this patient's most appropriate activity status/level should be described as: []     0    Normal activity, no symptoms [x]     1    Restricted in physical strenuous activity but ambulatory, able to do out light work []     2    Ambulatory and capable of self care, unable to do work activities, up and about >50 % of waking hours                              []     3    Only limited self care, in bed greater than 50% of waking hours []     4    Completely disabled, no self care, confined to bed or chair []     5    Moribund   Past Medical History:  Diagnosis Date   Allergy    Arthritis    Colon polyps    Depression    Hearing impaired person, bilateral    Hypertension    Low testosterone    Migraines    OSA (obstructive sleep  apnea) 09/27/2019   Sleep apnea    uses cpap   Vestibular migraine     Past Surgical History:  Procedure Laterality Date   BICEPS TENDON REPAIR Left    INNER EAR SURGERY Right    KNEE ARTHROSCOPY Left    ROTATOR CUFF REPAIR Bilateral    TYMPANOSTOMY TUBE PLACEMENT     WISDOM TOOTH EXTRACTION      Family History  Problem Relation Age of Onset   Prostate cancer Other    Heart attack Mother 102   Heart failure Mother    Hypertension Mother    Dementia Father 15   Parkinsonism Father    Colon polyps Father    Lung cancer Sister    Melanoma Sister    Lung disease Sister    Hypertension Sister    Colon cancer Neg Hx    Stomach cancer Neg Hx    Rectal cancer Neg Hx    Esophageal cancer Neg Hx     Social History Social History   Tobacco Use   Smoking status: Former    Types:  Cigarettes    Quit date: 2002    Years since quitting: 20.8   Smokeless tobacco: Never  Vaping Use   Vaping Use: Never used  Substance Use Topics   Alcohol use: Yes    Comment: occasionally   Drug use: No    Current Outpatient Medications  Medication Sig Dispense Refill   Cholecalciferol (VITAMIN D3) 125 MCG (5000 UT) CAPS Take 5,000 Units by mouth every 3 (three) days.     Cyanocobalamin (VITAMIN B-12) 3000 MCG SUBL Take 3,000 mcg by mouth daily.     folic acid (FOLVITE) 1 MG tablet Take 1 mg by mouth daily.     HYDROcodone-acetaminophen (NORCO) 5-325 MG tablet Take 1 tablet by mouth every 4 (four) hours as needed for moderate pain. 15 tablet 0   Magnesium 500 MG TABS Take 500 mg by mouth daily.     Multiple Vitamins-Minerals (CENTRUM SILVER 50+MEN PO) Take 1 tablet by mouth daily with breakfast.     naproxen sodium (ALEVE) 220 MG tablet Take 220-440 mg by mouth 2 (two) times daily as needed (for headaches or migraines).     OnabotulinumtoxinA (BOTOX IJ) Inject as directed every 3 (three) months.     tamsulosin (FLOMAX) 0.4 MG CAPS capsule Take 1 capsule (0.4 mg total) by mouth daily. 90  capsule 3   testosterone cypionate (DEPOTESTOSTERONE CYPIONATE) 200 MG/ML injection Inject 200 mg into the muscle every 14 (fourteen) days.     TYLENOL 500 MG tablet Take 500-1,000 mg by mouth every 6 (six) hours as needed (for headaches or migraines).     venlafaxine XR (EFFEXOR XR) 37.5 MG 24 hr capsule Take 1 capsule daily with breakfast for one week, then increase to 2 capsules daily with breakfast. 60 capsule 0   vitamin E 45 MG (100 UNITS) capsule Take 100 Units by mouth daily.     Whey Protein POWD Take 1 Scoop by mouth See admin instructions. Mix 1 scoopful into preferred beverage and drink by mouth once a day     Zinc 50 MG TABS Take 50 mg by mouth daily.     No current facility-administered medications for this visit.    Allergies  Allergen Reactions   Peppermint Oil Shortness Of Breath   Other Other (See Comments)    Unnamed anesthesia- Caused a feeling of aggression and the patient could not wake up well from it (had to be kept overnight at the hospital)    Review of Systems  Constitutional:  Positive for activity change, fatigue and unexpected weight change (Lost 20 pounds in past 6 months).  HENT:  Positive for hearing loss. Negative for trouble swallowing.   Respiratory:  Positive for apnea (CPAP), chest tightness and shortness of breath.   Cardiovascular:  Positive for chest pain.  Musculoskeletal:  Positive for arthralgias and joint swelling.  Neurological:  Positive for dizziness and headaches.   BP (!) 169/104 (BP Location: Right Arm, Patient Position: Sitting, Cuff Size: Large)   Pulse (!) 105   Resp 20   Ht 5\' 11"  (1.803 m)   Wt 274 lb (124.3 kg)   SpO2 96% Comment: RA  BMI 38.22 kg/m  Physical Exam Vitals reviewed.  Constitutional:      General: He is not in acute distress.    Appearance: Normal appearance. He is obese.  HENT:     Head: Normocephalic and atraumatic.  Eyes:     General: No scleral icterus.    Extraocular Movements: Extraocular movements  intact.  Cardiovascular:  Rate and Rhythm: Normal rate and regular rhythm.     Pulses: Normal pulses.     Heart sounds: Normal heart sounds. No murmur heard. Pulmonary:     Effort: Pulmonary effort is normal. No respiratory distress.     Breath sounds: Normal breath sounds. No wheezing.  Abdominal:     General: There is no distension.     Palpations: Abdomen is soft.  Musculoskeletal:     Cervical back: Neck supple.  Lymphadenopathy:     Cervical: No cervical adenopathy.  Skin:    General: Skin is warm and dry.  Neurological:     General: No focal deficit present.     Mental Status: He is alert and oriented to person, place, and time.     Cranial Nerves: No cranial nerve deficit.     Motor: No weakness.    Diagnostic Tests: CT ANGIOGRAPHY CHEST, ABDOMEN AND PELVIS   TECHNIQUE: Non-contrast CT of the chest was initially obtained.   Multidetector CT imaging through the chest, abdomen and pelvis was performed using the standard protocol during bolus administration of intravenous contrast. Multiplanar reconstructed images and MIPs were obtained and reviewed to evaluate the vascular anatomy.   CONTRAST:  51mL OMNIPAQUE IOHEXOL 350 MG/ML SOLN   COMPARISON:  None.   FINDINGS: CTA CHEST FINDINGS   Cardiovascular: The thoracic aorta is of normal caliber. No intramural hematoma, dissection, or aneurysm. Mild atherosclerotic calcification. Mild coronary artery calcification. Global cardiac size within normal limits. No pericardial effusion. The central pulmonary arteries are of normal caliber.   Mediastinum/Nodes: A lobulated hyperdense mass is seen within the anterior mediastinum measuring 4.6 x 6.2 cm at axial image # 33/6. This lesion demonstrates no significant enhancement when compared to precontrast imaging and, while a soft tissue mass such as a thymoma and less likely a nodal mass is favored, a proteinaceous cyst is also within the differential though this would  be an unusual location and contour. The visualized thyroid is unremarkable. No additional pathologic thoracic adenopathy. The esophagus is unremarkable.   Lungs/Pleura: Lungs are clear. No pleural effusion or pneumothorax.   Musculoskeletal: No acute bone abnormality. No lytic or blastic bone lesion.   Review of the MIP images confirms the above findings.   CTA ABDOMEN AND PELVIS FINDINGS   VASCULAR   Aorta: Mild atherosclerotic calcification. Normal caliber. No aneurysm or dissection. No periaortic inflammatory change.   Celiac: Patent without evidence of aneurysm, dissection, vasculitis or significant stenosis.   SMA: Patent without evidence of aneurysm, dissection, vasculitis or significant stenosis.   Renals: Both renal arteries are patent without evidence of aneurysm, dissection, vasculitis, fibromuscular dysplasia or significant stenosis.   IMA: Patent without evidence of aneurysm, dissection, vasculitis or significant stenosis.   Inflow: Patent without evidence of aneurysm, dissection, vasculitis or significant stenosis.   Veins: No obvious venous abnormality within the limitations of this arterial phase study.   Review of the MIP images confirms the above findings.   NON-VASCULAR   Hepatobiliary: No focal liver abnormality is seen. No gallstones, gallbladder wall thickening, or biliary dilatation.   Pancreas: Unremarkable   Spleen: A 4.1 cm low-attenuation lesion is seen within the medial pole of the spleen, not well characterized on this examination. This may be artifactual and relate to the relatively early phase of enhancement. Splenic size is within normal limits.   Adrenals/Urinary Tract: Adrenal glands are unremarkable. Kidneys are normal, without renal calculi, focal lesion, or hydronephrosis. Bladder is unremarkable.   Stomach/Bowel: Stomach is within  normal limits. Appendix appears normal. No evidence of bowel wall thickening, distention,  or inflammatory changes.   Lymphatic: No pathologic adenopathy within the abdomen and pelvis.   Reproductive: Moderate prostatic enlargement.   Other: No abdominal wall hernia or abnormality. No abdominopelvic ascites.   Musculoskeletal: Degenerative changes are seen within the lumbar spine. No acute bone abnormality. No lytic or blastic bone lesion.   Review of the MIP images confirms the above findings.   IMPRESSION: No evidence of thoracic aortic aneurysm or dissection. Mild atherosclerotic calcification noted throughout the thoracoabdominal aorta. No hemodynamically significant stenosis.   4.6 by 6.2 cm lobulated hyperdense lesion within the anterior mediastinum. While a solid mass, such as a thymoma or less likely lymphoma is favored, a hyperdense cyst is within the differential considerations. Contrast enhanced MRI examination may be helpful to differentiate solid from cystic nature of the lesion and better assess for subtle enhancement.   4.1 cm low-attenuation lesion within the spleen, possibly representing a pseudo lesion secondary to the relatively early phase of enhancement.   Mild coronary artery calcification   No acute intrathoracic or intra-abdominal pathology identified.   Aortic Atherosclerosis (ICD10-I70.0).     Electronically Signed   By: Fidela Salisbury M.D.   On: 01/08/2021 00:29   I personally reviewed the CT images.  There is a 4.6 x 6cm mass in the anterior mediastinum.  Michael Conway is a 70 year old man with a past medical history significant for hypertension, arthritis, obesity, obstructive sleep apnea, migraines, depression, and low testosterone.  He recently presented to the emergency department with chest pain.  He ruled out for MI, but a CT of the chest showed an anterior mediastinal mass.  Anterior mediastinal mass-about 4.6 x 6 cm.  No evidence of invasion of surrounding structures.  Differential diagnosis includes  thymic tumors, lymphoma, germ cell tumors, and ectopic thyroid.  I reviewed the CT images with Michael Conway and his friend Beryle Lathe.  We discussed the differential diagnosis.  We discussed options as to how to proceed.  I do think a needle biopsy will be particularly useful.  I did recommend we check alpha-fetoprotein and beta-hCG levels as those can potentially be diagnostic.  I also recommended we do a PET/CT mainly to assess extent of disease.  Once we have done the PET/CT I think the neck step would be to resect the mass.  There are 2 options for that one would be a robotic left thoracoscopy approach and the other would be median sternotomy.  We discussed the advantages and disadvantages of each approach.  They do understand that if we attempt robotic approach that conversion to a sternotomy might be necessary depending on intraoperative findings.  I informed him of the general nature of the procedure including the incisions to be used, the need for general anesthesia, the use of drains to postoperatively, the expected hospital stay, and the overall recovery.  I informed him of the indications, risk, benefits, and alternatives.  He understands the risks include, but not limited to death, MI, DVT, PE, bleeding, possibly transfusion, infection, cardiac arrhythmias, recurrent laryngeal or phrenic nerve injury, as well as possibility of other unstable complications  Atypical chest pain and shortness of breath with exertion-chest pain is atypical, but it is hard to pin at all on the anterior mediastinal mass.  I think he needs cardiology evaluation prior to any major surgery.  Plan: 1.  Alpha-fetoprotein and beta-hCG levels 2.  PET/CT to guide initial diagnostic work-up 3.  Cardiology consultation for preop clearance 4.  Return to discuss results and plan surgery  [name], MD [facility] [phone]

## 2021-01-14 ENCOUNTER — Encounter: Payer: Self-pay | Admitting: Internal Medicine

## 2021-01-14 DIAGNOSIS — J9859 Other diseases of mediastinum, not elsewhere classified: Secondary | ICD-10-CM | POA: Diagnosis not present

## 2021-01-14 DIAGNOSIS — Z01818 Encounter for other preprocedural examination: Secondary | ICD-10-CM | POA: Diagnosis not present

## 2021-01-15 ENCOUNTER — Encounter: Payer: Medicare Other | Admitting: Occupational Therapy

## 2021-01-15 NOTE — Progress Notes (Signed)
LMOVM for pt to call the office back.

## 2021-01-18 DIAGNOSIS — Z01818 Encounter for other preprocedural examination: Secondary | ICD-10-CM | POA: Insufficient documentation

## 2021-01-18 DIAGNOSIS — R931 Abnormal findings on diagnostic imaging of heart and coronary circulation: Secondary | ICD-10-CM | POA: Insufficient documentation

## 2021-01-18 DIAGNOSIS — R072 Precordial pain: Secondary | ICD-10-CM | POA: Insufficient documentation

## 2021-01-18 NOTE — Progress Notes (Addendum)
Cardiology Office Note   Date:  01/19/2021   ID:  Michael Conway, DOB 1950-07-31, MRN [id number]  PCP:  Michael Koch, MD  Cardiologist:   None Referring:  Michael Conway, *   Chief Complaint  Patient presents with   Chest Pain       History of Present Illness: Michael Conway is a 70 y.o. male who is referred by Michael Conway, * for preop evaluation.  He has a mediastinal mass.   He was in the ED on 10/12 and had chest pain without evidence of ischemia.  He had a CT which demonstrated no evidence of thoracic aneurysm or dissection.  He had mild aortic atherosclerosis of the aorta.  He had a 4.6 x 6.2 cm lobulated hypodense lesion in the anterior mediastinum.  There was a low-attenuation lesion in the spleen.  He had some mild coronary calcification.Marland Kitchen  PET is scheduled.      I did review the emergency room records.  He went to the ER on the 12th because of chest discomfort.  Started suddenly that day.  It was a midsternal discomfort while sitting.  It was sharp.  He never had it before.  It was 10 out of 10.  Cardiac enzymes were negative.  EKG was nonacute.  It hurts to lie on his right side.  Is more comfortable lying flat back.  He eventually only had improvement with Vicodin.  He had nausea.  He has been more short of breath.  He has not had any cough fevers or chills.  He has not had any prior cardiac history.  He has had some mild progressive shortness of breath over a couple of years.  There was a past history to include a perfusion study in April of last year with no evidence of ischemia.  There was an echocardiogram in 2014 which was normal.  There is also a perfusion study then as well.  Past Medical History:  Diagnosis Date   Allergy    Arthritis    Colon polyps    Depression    Hearing impaired person, bilateral    Low testosterone    Migraines    OSA (obstructive sleep apnea) 09/27/2019   Vestibular migraine     Past Surgical  History:  Procedure Laterality Date   BICEPS TENDON REPAIR Left    INNER EAR SURGERY Right    KNEE ARTHROSCOPY Left    ROTATOR CUFF REPAIR Bilateral    TYMPANOSTOMY TUBE PLACEMENT     WISDOM TOOTH EXTRACTION       Current Outpatient Medications  Medication Sig Dispense Refill   Cholecalciferol (VITAMIN D3) 125 MCG (5000 UT) CAPS Take 5,000 Units by mouth every 3 (three) days.     Cyanocobalamin (VITAMIN B-12) 3000 MCG SUBL Take 3,000 mcg by mouth daily.     folic acid (FOLVITE) 1 MG tablet Take 1 mg by mouth daily.     HYDROcodone-acetaminophen (NORCO) 5-325 MG tablet Take 1 tablet by mouth every 4 (four) hours as needed for moderate pain. 15 tablet 0   Magnesium 500 MG TABS Take 500 mg by mouth daily.     Multiple Vitamins-Minerals (CENTRUM SILVER 50+MEN PO) Take 1 tablet by mouth daily with breakfast.     naproxen sodium (ALEVE) 220 MG tablet Take 220-440 mg by mouth 2 (two) times daily as needed (for headaches or migraines).     OnabotulinumtoxinA (BOTOX IJ) Inject as directed every 3 (three) months.  tamsulosin (FLOMAX) 0.4 MG CAPS capsule Take 1 capsule (0.4 mg total) by mouth daily. 90 capsule 3   testosterone cypionate (DEPOTESTOSTERONE CYPIONATE) 200 MG/ML injection Inject 200 mg into the muscle every 14 (fourteen) days.     TYLENOL 500 MG tablet Take 500-1,000 mg by mouth every 6 (six) hours as needed (for headaches or migraines).     venlafaxine XR (EFFEXOR XR) 37.5 MG 24 hr capsule Take 1 capsule daily with breakfast for one week, then increase to 2 capsules daily with breakfast. 60 capsule 0   vitamin E 45 MG (100 UNITS) capsule Take 100 Units by mouth daily.     Whey Protein POWD Take 1 Scoop by mouth See admin instructions. Mix 1 scoopful into preferred beverage and drink by mouth once a day     Zinc 50 MG TABS Take 50 mg by mouth daily.     No current facility-administered medications for this visit.    Allergies:   Peppermint oil and Other    Social History:  The  patient  reports that he quit smoking about 20 years ago. His smoking use included cigarettes. He has never used smokeless tobacco. He reports current alcohol use. He reports that he does not use drugs.   Family History:  The patient's family history includes Colon polyps in his father; Dementia (age of onset: 27) in his father; Heart attack (age of onset: 22) in his mother; Heart failure in his mother; Hypertension in his mother and sister; Lung cancer in his sister; Lung disease in his sister; Melanoma in his sister; Parkinsonism in his father; Prostate cancer in an other family member.    ROS:  Please see the history of present illness.   Otherwise, review of systems are positive for migraines, vertigo.   All other systems are reviewed and negative.    PHYSICAL EXAM: VS:  BP 138/72   Pulse (!) 103   Ht 5\' 11"  (1.803 m)   Wt 277 lb (125.6 kg)   SpO2 96%   BMI 38.63 kg/m  , BMI Body mass index is 38.63 kg/m. GENERAL:  Well appearing HEENT:  Pupils equal round and reactive, fundi not visualized, oral mucosa unremarkable NECK:  No jugular venous distention, waveform within normal limits, carotid upstroke brisk and symmetric, no bruits, no thyromegaly LYMPHATICS:  No cervical, inguinal adenopathy LUNGS:  Clear to auscultation bilaterally BACK:  No CVA tenderness CHEST:  Unremarkable HEART:  PMI not displaced or sustained,S1 and S2 within normal limits, no S3, no S4, no clicks, no rubs, no murmurs ABD:  Flat, positive bowel sounds normal in frequency in pitch, no bruits, no rebound, no guarding, no midline pulsatile mass, no hepatomegaly, no splenomegaly EXT:  2 plus pulses throughout, no edema, no cyanosis no clubbing SKIN:  No rashes no nodules NEURO:  Cranial nerves II through XII grossly intact, motor grossly intact throughout PSYCH:  Cognitively intact, oriented to person place and time    EKG:  EKG is ordered today. The ekg ordered today demonstrates sinus rhythm, rate 103, axis  within normal limits, intervals within normal limits, diffuse ST elevation consistent with possible pericarditis.   Recent Labs: 01/06/2021: TSH 6.54 01/07/2021: BUN 23; Creatinine, Ser 1.15; Hemoglobin 17.7; Platelets 191; Potassium 4.7; Sodium 135    Lipid Panel    Component Value Date/Time   CHOL 140 12/27/2018 0843   TRIG 140.0 12/27/2018 0843   HDL 40.80 12/27/2018 0843   CHOLHDL 3 12/27/2018 0843   VLDL 28.0 12/27/2018 0843  [place] 71 12/27/2018 0843      Wt Readings from Last 3 Encounters:  01/19/21 277 lb (125.6 kg)  01/13/21 274 lb (124.3 kg)  01/07/21 274 lb (124.3 kg)      Other studies Reviewed: Additional studies/ records that were reviewed today include: CT, ED records. Review of the above records demonstrates:  Please see elsewhere in the note.     ASSESSMENT AND PLAN:  PREOP: The patient has had a negative perfusion study a little more than a year ago.  His pain is more consistent with pleuritic or pericarditis pain.  I am going to expedite an echocardiogram to see if there is any irritation or involvement of his pericardium.  However, given the negative perfusion study no further testing is needed prior to his chest surgery to remove the mediastinal mass   CHEST PAIN: This will be evaluated as above with the echo.  He continues on pain medications.   Current medicines are reviewed at length with the patient today.  The patient does not have concerns regarding medicines.  The following changes have been made:  no change  Labs/ tests ordered today include:   Orders Placed This Encounter  Procedures   EKG 12-Lead   ECHOCARDIOGRAM COMPLETE      Disposition:   FU with me as needed based on the results of the above testing.   Signed, Minus Breeding, MD  01/19/2021 10:06 AM    [facility]

## 2021-01-19 ENCOUNTER — Other Ambulatory Visit: Payer: Self-pay | Admitting: *Deleted

## 2021-01-19 ENCOUNTER — Encounter: Payer: Self-pay | Admitting: Cardiology

## 2021-01-19 ENCOUNTER — Telehealth: Payer: Self-pay | Admitting: *Deleted

## 2021-01-19 ENCOUNTER — Ambulatory Visit (INDEPENDENT_AMBULATORY_CARE_PROVIDER_SITE_OTHER): Payer: Medicare Other | Admitting: Cardiology

## 2021-01-19 ENCOUNTER — Other Ambulatory Visit: Payer: Self-pay

## 2021-01-19 ENCOUNTER — Encounter: Payer: Self-pay | Admitting: Internal Medicine

## 2021-01-19 ENCOUNTER — Encounter: Payer: Medicare Other | Admitting: Occupational Therapy

## 2021-01-19 VITALS — BP 138/72 | HR 103 | Ht 71.0 in | Wt 277.0 lb

## 2021-01-19 DIAGNOSIS — R931 Abnormal findings on diagnostic imaging of heart and coronary circulation: Secondary | ICD-10-CM | POA: Diagnosis not present

## 2021-01-19 DIAGNOSIS — Z01818 Encounter for other preprocedural examination: Secondary | ICD-10-CM

## 2021-01-19 DIAGNOSIS — R072 Precordial pain: Secondary | ICD-10-CM | POA: Diagnosis not present

## 2021-01-19 DIAGNOSIS — J9859 Other diseases of mediastinum, not elsewhere classified: Secondary | ICD-10-CM

## 2021-01-19 NOTE — Patient Instructions (Signed)
Medication Instructions:  Your physician recommends that you continue on your current medications as directed. Please refer to the Current Medication list given to you today.   Labwork: NONE  Testing/Procedures: Your physician has requested that you have an echocardiogram. Echocardiography is a painless test that uses sound waves to create images of your heart. It provides your doctor with information about the size and shape of your heart and how well your heart's chambers and valves are working. This procedure takes approximately one hour. There are no restrictions for this procedure. ASAP   Follow-Up: AS NEEDED

## 2021-01-19 NOTE — Telephone Encounter (Signed)
Pt is returning a call to sheena regarding results

## 2021-01-19 NOTE — Progress Notes (Signed)
LMOVM as well sent a mychart message to the pt.  The office tried contacting the patient on multiple  attempts.

## 2021-01-19 NOTE — Telephone Encounter (Signed)
Patient contacted the office requesting a refill of hydrocodone. Patient received prescription from emergency room. Advised patient to reach out to his PCP for refill as he has not had surgery by Dr. Roxan Hockey. Patient verbalized understanding.

## 2021-01-21 ENCOUNTER — Ambulatory Visit (HOSPITAL_COMMUNITY): Payer: Medicare Other | Attending: Cardiology

## 2021-01-21 ENCOUNTER — Ambulatory Visit (INDEPENDENT_AMBULATORY_CARE_PROVIDER_SITE_OTHER): Payer: Medicare Other | Admitting: Internal Medicine

## 2021-01-21 ENCOUNTER — Encounter: Payer: Self-pay | Admitting: Internal Medicine

## 2021-01-21 ENCOUNTER — Encounter: Payer: Medicare Other | Admitting: Occupational Therapy

## 2021-01-21 ENCOUNTER — Other Ambulatory Visit: Payer: Self-pay

## 2021-01-21 DIAGNOSIS — R931 Abnormal findings on diagnostic imaging of heart and coronary circulation: Secondary | ICD-10-CM

## 2021-01-21 DIAGNOSIS — K5903 Drug induced constipation: Secondary | ICD-10-CM | POA: Diagnosis not present

## 2021-01-21 DIAGNOSIS — R072 Precordial pain: Secondary | ICD-10-CM | POA: Diagnosis not present

## 2021-01-21 DIAGNOSIS — T402X5A Adverse effect of other opioids, initial encounter: Secondary | ICD-10-CM | POA: Insufficient documentation

## 2021-01-21 DIAGNOSIS — J9859 Other diseases of mediastinum, not elsewhere classified: Secondary | ICD-10-CM

## 2021-01-21 DIAGNOSIS — Z01818 Encounter for other preprocedural examination: Secondary | ICD-10-CM | POA: Insufficient documentation

## 2021-01-21 LAB — ECHOCARDIOGRAM COMPLETE
Area-P 1/2: 4.96 cm2
Height: 71 in
S' Lateral: 2 cm
Weight: 4422.4 oz

## 2021-01-21 MED ORDER — POLYETHYLENE GLYCOL 3350 17 GM/SCOOP PO POWD: 17.0000 g | Freq: Two times a day (BID) | ORAL | 1 refills | Status: DC | PRN

## 2021-01-21 MED ORDER — LINACLOTIDE 290 MCG PO CAPS: 290.0000 ug | ORAL_CAPSULE | Freq: Every day | ORAL | 3 refills | Status: DC

## 2021-01-21 MED ORDER — HYDROCODONE-ACETAMINOPHEN 5-325 MG PO TABS: 1.0000 | ORAL_TABLET | ORAL | 0 refills | Status: DC | PRN

## 2021-01-21 NOTE — Assessment & Plan Note (Signed)
Getting echo later today and cleared for surgery. PET scan next week with follow up for surgery planning with CT surgery next day.

## 2021-01-21 NOTE — Assessment & Plan Note (Signed)
Rx miralax to use for relief and then rx linzess to take chronically. Hopefully once this mass is removed the opioids will be able to be stopped.

## 2021-01-21 NOTE — Progress Notes (Signed)
   Subjective:   Patient ID: Michael Conway, male    DOB: [birth year], 70 y.o.   MRN: [id number]  HPI The patient is a 70 YO man coming in for follow up recent health events as well as chest pain and constipation related to hydrocodone.  Review of Systems  Constitutional: Negative.   HENT: Negative.    Eyes: Negative.   Respiratory:  Negative for cough, chest tightness and shortness of breath.   Cardiovascular:  Positive for chest pain. Negative for palpitations and leg swelling.  Gastrointestinal:  Positive for constipation. Negative for abdominal distention, abdominal pain, diarrhea, nausea and vomiting.  Musculoskeletal: Negative.   Skin: Negative.   Neurological: Negative.   Psychiatric/Behavioral: Negative.     Objective:  Physical Exam Constitutional:      Appearance: He is well-developed. He is obese.  HENT:     Head: Normocephalic and atraumatic.  Cardiovascular:     Rate and Rhythm: Normal rate and regular rhythm.  Pulmonary:     Effort: Pulmonary effort is normal. No respiratory distress.     Breath sounds: Normal breath sounds. No wheezing or rales.  Abdominal:     General: Bowel sounds are normal. There is no distension.     Palpations: Abdomen is soft.     Tenderness: There is no abdominal tenderness. There is no rebound.  Musculoskeletal:     Cervical back: Normal range of motion.  Skin:    General: Skin is warm and dry.  Neurological:     Mental Status: He is alert and oriented to person, place, and time.     Coordination: Coordination normal.    Vitals:   01/21/21 0857  BP: (!) 160/80  Pulse: (!) 102  Resp: 18  SpO2: 98%  Weight: 276 lb 6.4 oz (125.4 kg)  Height: 5\' 11"  (1.803 m)    This visit occurred during the SARS-CoV-2 public health emergency.  Safety protocols were in place, including screening questions prior to the visit, additional usage of staff PPE, and extensive cleaning of exam room while observing appropriate contact time as  indicated for disinfecting solutions.   Assessment & Plan:  Visit time 20 minutes in face to face communication with patient and coordination of care, additional 10 minutes spent in record review, coordination or care, ordering tests, communicating/referring to other healthcare professionals, documenting in medical records all on the same day of the visit for total time 30 minutes spent on the visit.

## 2021-01-21 NOTE — Patient Instructions (Addendum)
We have sent in the miralax to drink 1 capful every 2-3 hours until you go to the bathroom.  We have also sent in refill of the hydrocodone.  We have also sent in linzess to take daily to help prevent recurring issues with constipation.

## 2021-01-23 ENCOUNTER — Encounter: Payer: Medicare Other | Admitting: Thoracic Surgery (Cardiothoracic Vascular Surgery)

## 2021-01-26 ENCOUNTER — Encounter: Payer: Medicare Other | Admitting: Occupational Therapy

## 2021-01-26 ENCOUNTER — Ambulatory Visit (HOSPITAL_COMMUNITY)
Admission: RE | Admit: 2021-01-26 | Discharge: 2021-01-26 | Disposition: A | Discharge status: Home / Self Care | Payer: Medicare Other | Source: Ambulatory Visit | Attending: Thoracic Surgery (Cardiothoracic Vascular Surgery) | Admitting: Thoracic Surgery (Cardiothoracic Vascular Surgery)

## 2021-01-26 ENCOUNTER — Other Ambulatory Visit: Payer: Self-pay

## 2021-01-26 DIAGNOSIS — J9859 Other diseases of mediastinum, not elsewhere classified: Secondary | ICD-10-CM | POA: Diagnosis not present

## 2021-01-26 DIAGNOSIS — Z01818 Encounter for other preprocedural examination: Secondary | ICD-10-CM | POA: Diagnosis not present

## 2021-01-26 DIAGNOSIS — J90 Pleural effusion, not elsewhere classified: Secondary | ICD-10-CM | POA: Insufficient documentation

## 2021-01-26 DIAGNOSIS — I2510 Atherosclerotic heart disease of native coronary artery without angina pectoris: Secondary | ICD-10-CM | POA: Diagnosis not present

## 2021-01-26 DIAGNOSIS — D7389 Other diseases of spleen: Secondary | ICD-10-CM | POA: Diagnosis not present

## 2021-01-26 LAB — GLUCOSE, CAPILLARY: Glucose-Capillary: 95 mg/dL (ref 70–99)

## 2021-01-26 MED ORDER — FLUDEOXYGLUCOSE F - 18 (FDG) INJECTION
13.6900 | Freq: Once | INTRAVENOUS | Status: AC
  Administered 2021-01-26: 13.69 via INTRAVENOUS

## 2021-01-27 ENCOUNTER — Other Ambulatory Visit: Payer: Self-pay | Admitting: *Deleted

## 2021-01-27 ENCOUNTER — Other Ambulatory Visit: Payer: Self-pay

## 2021-01-27 ENCOUNTER — Encounter: Payer: Self-pay | Admitting: Internal Medicine

## 2021-01-27 ENCOUNTER — Ambulatory Visit (INDEPENDENT_AMBULATORY_CARE_PROVIDER_SITE_OTHER): Payer: Medicare Other | Admitting: Thoracic Surgery (Cardiothoracic Vascular Surgery)

## 2021-01-27 ENCOUNTER — Encounter: Payer: Self-pay | Admitting: Thoracic Surgery (Cardiothoracic Vascular Surgery)

## 2021-01-27 VITALS — BP 143/82 | HR 90 | Resp 20 | Ht 71.0 in | Wt 270.0 lb

## 2021-01-27 DIAGNOSIS — J9859 Other diseases of mediastinum, not elsewhere classified: Secondary | ICD-10-CM | POA: Diagnosis not present

## 2021-01-27 DIAGNOSIS — Z5181 Encounter for therapeutic drug level monitoring: Secondary | ICD-10-CM

## 2021-01-27 DIAGNOSIS — R931 Abnormal findings on diagnostic imaging of heart and coronary circulation: Secondary | ICD-10-CM

## 2021-01-27 DIAGNOSIS — R590 Localized enlarged lymph nodes: Secondary | ICD-10-CM

## 2021-01-27 NOTE — H&P (View-Only) (Signed)
[address]       [place] [address]             [phone]     HPI: Michael Conway returns to discuss the results of his PET/CT.  Michael Conway is a 70 year old man with a history of hypertension, arthritis, obesity, obstructive sleep apnea, migraines, depression, and low testosterone.  A couple of weeks ago he had an episode of chest pain.  There was a sudden onset.  He felt like it was indigestion initially but then it radiated to his neck.  He finally went to the emergency room.  He ruled out for MI.  A CT angiogram showed an anterior mediastinal mass.  I saw him on 01/13/2021.  I recommended he see a cardiologist just to make sure there was not any additional testing to be done.  Dr. Percival Spanish did an echocardiogram which showed normal left ventricular function.  There was mild dilatation of the ascending aorta.  He also had a PET/CT.  He continues to have the chest pain. Past Medical History:  Diagnosis Date   Allergy    Arthritis    Colon polyps    Depression    Hearing impaired person, bilateral    Low testosterone    Migraines    OSA (obstructive sleep apnea) 09/27/2019   Vestibular migraine     Current Outpatient Medications  Medication Sig Dispense Refill   Cholecalciferol (VITAMIN D3) 125 MCG (5000 UT) CAPS Take 5,000 Units by mouth every 3 (three) days.     Cyanocobalamin (VITAMIN B-12) 3000 MCG SUBL Take 3,000 mcg by mouth daily.     folic acid (FOLVITE) 1 MG tablet Take 1 mg by mouth daily.     HYDROcodone-acetaminophen (NORCO) 5-325 MG tablet Take 1-2 tablets by mouth every 4 (four) hours as needed for moderate pain. 60 tablet 0   Magnesium 500 MG TABS Take 500 mg by mouth daily.     Multiple Vitamins-Minerals (CENTRUM SILVER 50+MEN PO) Take 1 tablet by mouth daily with breakfast.     naproxen sodium (ALEVE) 220 MG tablet Take 220-440 mg by mouth 2 (two) times daily as needed (for headaches or migraines).     OnabotulinumtoxinA  (BOTOX IJ) Inject as directed every 3 (three) months.     polyethylene glycol powder (GLYCOLAX/MIRALAX) 17 GM/SCOOP powder Take 17 g by mouth 2 (two) times daily as needed. 3350 g 1   tamsulosin (FLOMAX) 0.4 MG CAPS capsule Take 1 capsule (0.4 mg total) by mouth daily. 90 capsule 3   testosterone cypionate (DEPOTESTOSTERONE CYPIONATE) 200 MG/ML injection Inject 200 mg into the muscle every 14 (fourteen) days.     TYLENOL 500 MG tablet Take 500-1,000 mg by mouth every 6 (six) hours as needed (for headaches or migraines).     venlafaxine XR (EFFEXOR XR) 37.5 MG 24 hr capsule Take 1 capsule daily with breakfast for one week, then increase to 2 capsules daily with breakfast. 60 capsule 0   vitamin E 45 MG (100 UNITS) capsule Take 100 Units by mouth daily.     Whey Protein POWD Take 1 Scoop by mouth See admin instructions. Mix 1 scoopful into preferred beverage and drink by mouth once a day     Zinc 50 MG TABS Take 50 mg by mouth daily.     No current facility-administered medications for this visit.    Physical Exam BP (!) 143/82   Pulse 90   Resp 20  Ht 5\' 11"  (1.803 m)   Wt 270 lb (122.5 kg)   SpO2 95% Comment: RA  BMI 37.32 kg/m  70 year old man in no acute distress Anxious, Alert and oriented x3 Regular rate and rhythm  Diagnostic Tests: NUCLEAR MEDICINE PET SKULL BASE TO THIGH   TECHNIQUE: 13.7 mCi F-18 FDG was injected intravenously. Full-ring PET imaging was performed from the skull base to thigh after the radiotracer. CT data was obtained and used for attenuation correction and anatomic localization.   Fasting blood glucose: 95 mg/dl   COMPARISON:  01/07/2021 CTA chest   FINDINGS: Mediastinal blood pool activity: SUV max 2.4   Liver activity: SUV max 3.2   NECK: No areas of abnormal hypermetabolism.   Incidental CT findings: No cervical adenopathy. Bilateral carotid atherosclerosis.   CHEST: A node at the right thoracic inlet measures 1.4 cm a S.U.V. max of 5.5  on 47/4.   The anterior mediastinal mass is hypermetabolic. Example 5.9 x 4.7 cm and a S.U.V. max of 7.3 on 66/4.   Small left axillary nodes are hypermetabolic. Example at a S.U.V. max of 3.9 on 61/4.   Incidental CT findings: Small right and trace left pleural effusions, new. Aortic and coronary artery calcification. Mild cardiomegaly.   ABDOMEN/PELVIS: The medial splenic lesion is hypermetabolic. 4.1 cm on prior contrast enhanced CT and a S.U.V. max of 13.9 on 106/4.   An aortocaval 7 mm node demonstrates equivocal hypermetabolism at a S.U.V. max of 2.7 on 133/4.   Incidental CT findings: Deferred to recent diagnostic CT. Normal adrenal glands. Abdominal aortic atherosclerosis. Subcentimeter right hepatic lobe cyst. Moderate prostatomegaly.   SKELETON: No abnormal marrow activity.   Incidental CT findings: none   IMPRESSION: 1. The anterior mediastinal and medial splenic masses are both hypermetabolic, most consistent with lymphoma. ([place]) 5 2. Thoracic and equivocal abdominal nodal involvement as detailed above. 3. New small right and trace left pleural effusions since 01/07/2021. 4. Incidental findings, including: Coronary artery atherosclerosis. Aortic Atherosclerosis (ICD10-I70.0). Prostatomegaly.     Electronically Signed   By: Abigail Miyamoto M.D.   On: 01/26/2021 08:44 I personally reviewed the CT and PET/CT images.  The anterior mediastinal mass is hypermetabolic.  There are multiple areas of hypermetabolic lymph nodes.  There is a large area of hypermetabolism in the spleen.  Impression: Michael Conway is a 70 year old man who recently presented with chest pain.  Work-up revealed an anterior mediastinal mass.  He has now had a PET/CT which showed additional findings of hypermetabolic lymph nodes and a large splenic mass that is hypermetabolic.  These findings are highly suspicious for lymphoma.  Thymic carcinoma and germ cell tumors are far less  likely.  My initial intention was to proceed with resection of the anterior mediastinal mass.  However the PET/CT findings suggest that would not be the best option.  I think this is far more likely to be lymphoma.  Even if it some other process, it is not resectable given the right supraclavicular adenopathy and splenic lesions.  I recommended we proceed with a right supraclavicular lymph node biopsy.  The node is not easily palpable so I think doing it under general anesthesia would be the best option.  We could do this as an outpatient procedure.  He and his daughter understand this would be diagnostic and not therapeutic.  I informed him of the indications, risks, benefits, and alternatives.  They understand the risks include, but not limited to bleeding, infection, and lymphatic leak.  He  accepts the risks and agrees to proceed.  Plan: We will plan for right supraclavicular lymph node biopsy later this week.  I spent over 20 minutes in review of records, images, and in consultation with Mr. Swigert today. [name], MD [facility] [phone]

## 2021-01-27 NOTE — Progress Notes (Signed)
[address]       [place], [address]             [phone]     HPI: Michael Conway returns to discuss the results of his PET/CT.  Michael Conway is a 70 year old man with a history of hypertension, arthritis, obesity, obstructive sleep apnea, migraines, depression, and low testosterone.  A couple of weeks ago he had an episode of chest pain.  There was a sudden onset.  He felt like it was indigestion initially but then it radiated to his neck.  He finally went to the emergency room.  He ruled out for MI.  A CT angiogram showed an anterior mediastinal mass.  I saw him on 01/13/2021.  I recommended he see a cardiologist just to make sure there was not any additional testing to be done.  Dr. Percival Spanish did an echocardiogram which showed normal left ventricular function.  There was mild dilatation of the ascending aorta.  He also had a PET/CT.  He continues to have the chest pain. Past Medical History:  Diagnosis Date   Allergy    Arthritis    Colon polyps    Depression    Hearing impaired person, bilateral    Low testosterone    Migraines    OSA (obstructive sleep apnea) 09/27/2019   Vestibular migraine     Current Outpatient Medications  Medication Sig Dispense Refill   Cholecalciferol (VITAMIN D3) 125 MCG (5000 UT) CAPS Take 5,000 Units by mouth every 3 (three) days.     Cyanocobalamin (VITAMIN B-12) 3000 MCG SUBL Take 3,000 mcg by mouth daily.     folic acid (FOLVITE) 1 MG tablet Take 1 mg by mouth daily.     HYDROcodone-acetaminophen (NORCO) 5-325 MG tablet Take 1-2 tablets by mouth every 4 (four) hours as needed for moderate pain. 60 tablet 0   Magnesium 500 MG TABS Take 500 mg by mouth daily.     Multiple Vitamins-Minerals (CENTRUM SILVER 50+MEN PO) Take 1 tablet by mouth daily with breakfast.     naproxen sodium (ALEVE) 220 MG tablet Take 220-440 mg by mouth 2 (two) times daily as needed (for headaches or migraines).     OnabotulinumtoxinA  (BOTOX IJ) Inject as directed every 3 (three) months.     polyethylene glycol powder (GLYCOLAX/MIRALAX) 17 GM/SCOOP powder Take 17 g by mouth 2 (two) times daily as needed. 3350 g 1   tamsulosin (FLOMAX) 0.4 MG CAPS capsule Take 1 capsule (0.4 mg total) by mouth daily. 90 capsule 3   testosterone cypionate (DEPOTESTOSTERONE CYPIONATE) 200 MG/ML injection Inject 200 mg into the muscle every 14 (fourteen) days.     TYLENOL 500 MG tablet Take 500-1,000 mg by mouth every 6 (six) hours as needed (for headaches or migraines).     venlafaxine XR (EFFEXOR XR) 37.5 MG 24 hr capsule Take 1 capsule daily with breakfast for one week, then increase to 2 capsules daily with breakfast. 60 capsule 0   vitamin E 45 MG (100 UNITS) capsule Take 100 Units by mouth daily.     Whey Protein POWD Take 1 Scoop by mouth See admin instructions. Mix 1 scoopful into preferred beverage and drink by mouth once a day     Zinc 50 MG TABS Take 50 mg by mouth daily.     No current facility-administered medications for this visit.    Physical Exam BP (!) 143/82   Pulse 90   Resp 20  Ht 5\' 11"  (1.803 m)   Wt 270 lb (122.5 kg)   SpO2 95% Comment: RA  BMI 37.11 kg/m  70 year old man in no acute distress Anxious, Alert and oriented x3 Regular rate and rhythm  Diagnostic Tests: NUCLEAR MEDICINE PET SKULL BASE TO THIGH   TECHNIQUE: 13.7 mCi F-18 FDG was injected intravenously. Full-ring PET imaging was performed from the skull base to thigh after the radiotracer. CT data was obtained and used for attenuation correction and anatomic localization.   Fasting blood glucose: 95 mg/dl   COMPARISON:  01/07/2021 CTA chest   FINDINGS: Mediastinal blood pool activity: SUV max 2.4   Liver activity: SUV max 3.2   NECK: No areas of abnormal hypermetabolism.   Incidental CT findings: No cervical adenopathy. Bilateral carotid atherosclerosis.   CHEST: A node at the right thoracic inlet measures 1.4 cm a S.U.V. max of 5.5  on 47/4.   The anterior mediastinal mass is hypermetabolic. Example 5.9 x 4.7 cm and a S.U.V. max of 7.3 on 66/4.   Small left axillary nodes are hypermetabolic. Example at a S.U.V. max of 3.9 on 61/4.   Incidental CT findings: Small right and trace left pleural effusions, new. Aortic and coronary artery calcification. Mild cardiomegaly.   ABDOMEN/PELVIS: The medial splenic lesion is hypermetabolic. 4.1 cm on prior contrast enhanced CT and a S.U.V. max of 13.9 on 106/4.   An aortocaval 7 mm node demonstrates equivocal hypermetabolism at a S.U.V. max of 2.7 on 133/4.   Incidental CT findings: Deferred to recent diagnostic CT. Normal adrenal glands. Abdominal aortic atherosclerosis. Subcentimeter right hepatic lobe cyst. Moderate prostatomegaly.   SKELETON: No abnormal marrow activity.   Incidental CT findings: none   IMPRESSION: 1. The anterior mediastinal and medial splenic masses are both hypermetabolic, most consistent with lymphoma. ([place]) 5 2. Thoracic and equivocal abdominal nodal involvement as detailed above. 3. New small right and trace left pleural effusions since 01/07/2021. 4. Incidental findings, including: Coronary artery atherosclerosis. Aortic Atherosclerosis (ICD10-I70.0). Prostatomegaly.     Electronically Signed   By: Abigail Miyamoto M.D.   On: 01/26/2021 08:44 I personally reviewed the CT and PET/CT images.  The anterior mediastinal mass is hypermetabolic.  There are multiple areas of hypermetabolic lymph nodes.  There is a large area of hypermetabolism in the spleen.  Impression: Michael Conway is a 70 year old man who recently presented with chest pain.  Work-up revealed an anterior mediastinal mass.  He has now had a PET/CT which showed additional findings of hypermetabolic lymph nodes and a large splenic mass that is hypermetabolic.  These findings are highly suspicious for lymphoma.  Thymic carcinoma and germ cell tumors are far less  likely.  My initial intention was to proceed with resection of the anterior mediastinal mass.  However the PET/CT findings suggest that would not be the best option.  I think this is far more likely to be lymphoma.  Even if it some other process, it is not resectable given the right supraclavicular adenopathy and splenic lesions.  I recommended we proceed with a right supraclavicular lymph node biopsy.  The node is not easily palpable so I think doing it under general anesthesia would be the best option.  We could do this as an outpatient procedure.  He and his daughter understand this would be diagnostic and not therapeutic.  I informed him of the indications, risks, benefits, and alternatives.  They understand the risks include, but not limited to bleeding, infection, and lymphatic leak.  He  accepts the risks and agrees to proceed.  Plan: We will plan for right supraclavicular lymph node biopsy later this week.  I spent over 20 minutes in review of records, images, and in consultation with Mr. Perusse today. [name], MD [facility] [phone]

## 2021-01-28 ENCOUNTER — Encounter: Payer: Medicare Other | Admitting: Occupational Therapy

## 2021-01-28 NOTE — Progress Notes (Signed)
Surgical Instructions    Your procedure is scheduled on Friday November 4th.  Report to [facility] [address] Main Entrance "A" at 9:30 A.M., then check in with the Admitting office.  Call this number if you have problems the morning of surgery:  [phone]   If you have any questions prior to your surgery date call [phone]: Open Monday-Friday 8am-4pm    Remember:  Do not eat after midnight the night before your surgery  You may drink clear liquids until 8:30am the morning of your surgery.   Clear liquids allowed are: Water, Non-Citrus Juices (without pulp), Carbonated Beverages, Clear Tea, Black Coffee ONLY (NO MILK, CREAM OR POWDERED CREAMER of any kind), and Gatorade    Take these medicines the morning of surgery with A SIP OF WATER venlafaxine XR (EFFEXOR XR) 37.5 MG 24 hr capsule  IF NEEDED HYDROcodone-acetaminophen (NORCO) 5-325 MG tablet Polyvinyl Alcohol-Povidone PF (REFRESH) 1.4-0.6 % SOLN  As of today, STOP taking any Aspirin (unless otherwise instructed by your surgeon) Aleve, Naproxen, Ibuprofen, Motrin, Advil, Goody's, BC's, all herbal medications, fish oil, and all vitamins.     After your COVID test   You are not required to quarantine however you are required to wear a well-fitting mask when you are out and around people not in your household.  If your mask becomes wet or soiled, replace with a new one.  Wash your hands often with soap and water for 20 seconds or clean your hands with an alcohol-based hand sanitizer that contains at least 60% alcohol.  Do not share personal items.  Notify your provider: if you are in close contact with someone who has COVID  or if you develop a fever of 100.4 or greater, sneezing, cough, sore throat, shortness of breath or body aches.             Do not wear jewelry  Do not wear lotions, powders, colognes, or deodorant. Do not shave 48 hours prior to surgery.  Men may shave face and neck. Do not bring valuables to the  hospital. DO Not wear nail polish, gel polish, artificial nails, or any other type of covering on natural nails including finger and toenails. If patients have artificial nails, gel coating, etc. that need to be removed by a nail salon, please have this removed prior to surgery or surgery may need to be canceled/delayed if the surgeon/ anesthesia feels like the patient is unable to be adequately monitored.             [facility] is not responsible for any belongings or valuables.  Do NOT Smoke (Tobacco/Vaping)  24 hours prior to your procedure  If you use a CPAP at night, you may bring your mask for your overnight stay.   Contacts, glasses, hearing aids, dentures or partials may not be worn into surgery, please bring cases for these belongings   For patients admitted to the hospital, discharge time will be determined by your treatment team.   Patients discharged the day of surgery will not be allowed to drive home, and someone needs to stay with them for 24 hours.  NO VISITORS WILL BE ALLOWED IN PRE-OP WHERE PATIENTS ARE PREPPED FOR SURGERY.  ONLY 1 SUPPORT PERSON MAY BE PRESENT IN THE WAITING ROOM WHILE YOU ARE IN SURGERY.  IF YOU ARE TO BE ADMITTED, ONCE YOU ARE IN YOUR ROOM YOU WILL BE ALLOWED TWO (2) VISITORS. 1 (ONE) VISITOR MAY STAY OVERNIGHT BUT MUST ARRIVE TO THE ROOM BY 8pm.  Minor children may have two parents present. Special consideration for safety and communication needs will be reviewed on a case by case basis.  Special instructions:    Oral Hygiene is also important to reduce your risk of infection.  Remember - BRUSH YOUR TEETH THE MORNING OF SURGERY WITH YOUR REGULAR TOOTHPASTE   [facility]- Preparing For Surgery  Before surgery, you can play an important role. Because skin is not sterile, your skin needs to be as free of germs as possible. You can reduce the number of germs on your skin by washing with CHG (chlorahexidine gluconate) Soap before surgery.  CHG is an  antiseptic cleaner which kills germs and bonds with the skin to continue killing germs even after washing.     Please do not use if you have an allergy to CHG or antibacterial soaps. If your skin becomes reddened/irritated stop using the CHG.  Do not shave (including legs and underarms) for at least 48 hours prior to first CHG shower. It is OK to shave your face.  Please follow these instructions carefully.     Shower the NIGHT BEFORE SURGERY and the MORNING OF SURGERY with CHG Soap.   If you chose to wash your hair, wash your hair first as usual with your normal shampoo. After you shampoo, rinse your hair and body thoroughly to remove the shampoo.  Then [employer] and genitals (private parts) with your normal soap and rinse thoroughly to remove soap.  After that Use CHG Soap as you would any other liquid soap. You can apply CHG directly to the skin and wash gently with a scrungie or a clean washcloth.   Apply the CHG Soap to your body ONLY FROM THE NECK DOWN.  Do not use on open wounds or open sores. Avoid contact with your eyes, ears, mouth and genitals (private parts). Wash Face and genitals (private parts)  with your normal soap.   Wash thoroughly, paying special attention to the area where your surgery will be performed.  Thoroughly rinse your body with warm water from the neck down.  DO NOT shower/wash with your normal soap after using and rinsing off the CHG Soap.  Pat yourself dry with a CLEAN TOWEL.  Wear CLEAN PAJAMAS to bed the night before surgery  Place CLEAN SHEETS on your bed the night before your surgery  DO NOT SLEEP WITH PETS.   Day of Surgery:  Take a shower with CHG soap. Wear Clean/Comfortable clothing the morning of surgery Do not apply any deodorants/lotions.   Remember to brush your teeth WITH YOUR REGULAR TOOTHPASTE.   Please read over the following fact sheets that you were given.

## 2021-01-29 ENCOUNTER — Encounter (HOSPITAL_COMMUNITY)
Admission: RE | Admit: 2021-01-29 | Discharge: 2021-01-29 | Disposition: A | Discharge status: Home / Self Care | Payer: Medicare Other | Source: Ambulatory Visit | Attending: Orthopaedic Surgery | Admitting: Orthopaedic Surgery

## 2021-01-29 ENCOUNTER — Encounter (HOSPITAL_COMMUNITY): Payer: Self-pay

## 2021-01-29 ENCOUNTER — Other Ambulatory Visit: Payer: Self-pay

## 2021-01-29 ENCOUNTER — Encounter (HOSPITAL_COMMUNITY)
Admission: RE | Admit: 2021-01-29 | Discharge: 2021-01-29 | Disposition: A | Discharge status: Home / Self Care | Payer: Medicare Other | Source: Ambulatory Visit | Attending: Thoracic Surgery (Cardiothoracic Vascular Surgery) | Admitting: Thoracic Surgery (Cardiothoracic Vascular Surgery)

## 2021-01-29 DIAGNOSIS — Z79899 Other long term (current) drug therapy: Secondary | ICD-10-CM | POA: Insufficient documentation

## 2021-01-29 DIAGNOSIS — R590 Localized enlarged lymph nodes: Secondary | ICD-10-CM | POA: Insufficient documentation

## 2021-01-29 DIAGNOSIS — R918 Other nonspecific abnormal finding of lung field: Secondary | ICD-10-CM | POA: Diagnosis not present

## 2021-01-29 DIAGNOSIS — Z9989 Dependence on other enabling machines and devices: Secondary | ICD-10-CM | POA: Insufficient documentation

## 2021-01-29 DIAGNOSIS — Z01818 Encounter for other preprocedural examination: Secondary | ICD-10-CM | POA: Diagnosis not present

## 2021-01-29 DIAGNOSIS — G4733 Obstructive sleep apnea (adult) (pediatric): Secondary | ICD-10-CM | POA: Diagnosis not present

## 2021-01-29 DIAGNOSIS — Z5181 Encounter for therapeutic drug level monitoring: Secondary | ICD-10-CM | POA: Diagnosis not present

## 2021-01-29 LAB — COMPREHENSIVE METABOLIC PANEL
ALT: 28 U/L (ref 0–44)
AST: 32 U/L (ref 15–41)
Albumin: 3.4 g/dL — ABNORMAL LOW (ref 3.5–5.0)
Alkaline Phosphatase: 141 U/L — ABNORMAL HIGH (ref 38–126)
Anion gap: 7 (ref 5–15)
BUN: 19 mg/dL (ref 8–23)
CO2: 26 mmol/L (ref 22–32)
Calcium: 9 mg/dL (ref 8.9–10.3)
Chloride: 103 mmol/L (ref 98–111)
Creatinine, Ser: 1.22 mg/dL (ref 0.61–1.24)
GFR, Estimated: >60 mL/min (ref >60)
Glucose, Bld: 106 mg/dL — ABNORMAL HIGH (ref 70–99)
Potassium: 4.8 mmol/L (ref 3.5–5.1)
Sodium: 136 mmol/L (ref 135–145)
Total Bilirubin: 0.7 mg/dL (ref 0.3–1.2)
Total Protein: 6.7 g/dL (ref 6.5–8.1)

## 2021-01-29 LAB — CBC
HCT: 50.3 % (ref 39.0–52.0)
Hemoglobin: 16.4 g/dL (ref 13.0–17.0)
MCH: 28.9 pg (ref 26.0–34.0)
MCHC: 32.6 g/dL (ref 30.0–36.0)
MCV: 88.7 fL (ref 80.0–100.0)
Platelets: 314 10*3/uL (ref 150–400)
RBC: 5.67 MIL/uL (ref 4.22–5.81)
RDW: 12.9 % (ref 11.5–15.5)
WBC: 11.1 10*3/uL — ABNORMAL HIGH (ref 4.0–10.5)
nRBC: 0 % (ref 0.0–0.2)

## 2021-01-29 LAB — APTT: aPTT: 36 seconds (ref 24–36)

## 2021-01-29 LAB — PROTIME-INR
INR: 1 (ref 0.8–1.2)
Prothrombin Time: 13.2 seconds (ref 11.4–15.2)

## 2021-01-29 NOTE — Anesthesia Preprocedure Evaluation (Addendum)
Anesthesia Evaluation  Patient identified by MRN, date of birth, ID band Patient awake    Reviewed: Allergy & Precautions, H&P , NPO status , Patient's Chart, lab work & pertinent test results  Airway Mallampati: II   Neck ROM: full    Dental   Pulmonary sleep apnea , former smoker,    breath sounds clear to auscultation       Cardiovascular hypertension, + CAD   Rhythm:regular Rate:Normal     Neuro/Psych  Headaches, PSYCHIATRIC DISORDERS Depression    GI/Hepatic   Endo/Other  obese  Renal/GU      Musculoskeletal  (+) Arthritis ,   Abdominal   Peds  Hematology   Anesthesia Other Findings   Reproductive/Obstetrics                            Anesthesia Physical Anesthesia Plan  ASA: 3  Anesthesia Plan: General   Post-op Pain Management:    Induction: Intravenous  PONV Risk Score and Plan: 2 and Ondansetron, Dexamethasone and Treatment may vary due to age or medical condition  Airway Management Planned: Oral ETT  Additional Equipment:   Intra-op Plan:   Post-operative Plan: Extubation in OR  Informed Consent: I have reviewed the patients History and Physical, chart, labs and discussed the procedure including the risks, benefits and alternatives for the proposed anesthesia with the patient or authorized representative who has indicated his/her understanding and acceptance.     Dental advisory given  Plan Discussed with: CRNA, Anesthesiologist and Surgeon  Anesthesia Plan Comments: (PAT note by Karoline Caldwell, PA-C: Recently seen in the ED 01/07/2021 for evaluation of chest pain.  MI was ruled out.  CT scan showed anterior mediastinal mass.  Patient did subsequently follow-up with cardiology, seen by Dr. Percival Spanish on 01/19/2021.  He commented on perioperative risk stating, "PREOP:The patient has had a negative perfusion study a little more than a year ago. His pain is more  consistent with pleuritic or pericarditis pain. I am going to expedite an echocardiogram to see if there is any irritation or involvement of his pericardium. However, given the negative perfusion study no further testing is needed prior to his chest surgery to remove the mediastinal mass."  Echo showed EF 65 to 70%, no significant valvular abnormalities, no pericardial effusion.  Patient had prior nuclear stress in April 2021 that was nonischemic.  OSA on CPAP  Preop labs reviewed, unremarkable.  EKG 01/19/2021: Sinus tachycardia.  Rate 102.  ST elevation, consider early repolarization, pericarditis, or injury.  CTA chest abdomen pelvis 01/07/2021: IMPRESSION: No evidence of thoracic aortic aneurysm or dissection. Mild atherosclerotic calcification noted throughout the thoracoabdominal aorta. No hemodynamically significant stenosis.  4.6 by 6.2 cm lobulated hyperdense lesion within the anterior mediastinum. While a solid mass, such as a thymoma or less likely lymphoma is favored, a hyperdense cyst is within the differential considerations. Contrast enhanced MRI examination may be helpful to differentiate solid from cystic nature of the lesion and better assess for subtle enhancement.  4.1 cm low-attenuation lesion within the spleen, possibly representing a pseudo lesion secondary to the relatively early phase of enhancement.  Mild coronary artery calcification  No acute intrathoracic or intra-abdominal pathology identified.  TTE 01/21/2021: 1. Left ventricular ejection fraction, by estimation, is 65 to 70%. The  left ventricle has normal function. The left ventricle has no regional  wall motion abnormalities. Left ventricular diastolic parameters were  normal.  2. Right ventricular systolic function is  normal. The right ventricular  size is normal. There is mildly elevated pulmonary artery systolic  pressure. The estimated right ventricular systolic pressure is 68.1 mmHg.   3. Left atrial size was mildly dilated.  4. The mitral valve is normal in structure. No evidence of mitral valve  regurgitation. No evidence of mitral stenosis.  5. The aortic valve is tricuspid. Aortic valve regurgitation is not  visualized. Mild aortic valve sclerosis is present, with no evidence of  aortic valve stenosis.  6. Aortic dilatation noted. There is mild dilatation of the ascending  aorta, measuring 39 mm. There is mild dilatation of the aortic root,  measuring 39 mm.  7. The inferior vena cava is normal in size with greater than 50%  respiratory variability, suggesting right atrial pressure of 3 mmHg.   Nuclear stress 07/12/2019: . Nuclear stress EF: 64%. . There was no ST segment deviation noted during stress. . The study is normal. . The left ventricular ejection fraction is normal (55-65%).  Normal resting and stress perfusion. No ischemia or infarction EF 64%  )       Anesthesia Quick Evaluation

## 2021-01-29 NOTE — Progress Notes (Signed)
Anesthesia Chart Review:  Recently seen in the ED 01/07/2021 for evaluation of chest pain.  MI was ruled out.  CT scan showed anterior mediastinal mass.  Patient did subsequently follow-up with cardiology, seen by Dr. Percival Spanish on 01/19/2021.  He commented on perioperative risk stating, "PREOP: The patient has had a negative perfusion study a little more than a year ago.  His pain is more consistent with pleuritic or pericarditis pain.  I am going to expedite an echocardiogram to see if there is any irritation or involvement of his pericardium.  However, given the negative perfusion study no further testing is needed prior to his chest surgery to remove the mediastinal mass."  Echo showed EF 65 to 70%, no significant valvular abnormalities, no pericardial effusion.  Patient had prior nuclear stress in April 2021 that was nonischemic.  OSA on CPAP  Preop labs reviewed, unremarkable.  EKG 01/19/2021: Sinus tachycardia.  Rate 102.  ST elevation, consider early repolarization, pericarditis, or injury.  CTA chest abdomen pelvis 01/07/2021: IMPRESSION: No evidence of thoracic aortic aneurysm or dissection. Mild atherosclerotic calcification noted throughout the thoracoabdominal aorta. No hemodynamically significant stenosis.   4.6 by 6.2 cm lobulated hyperdense lesion within the anterior mediastinum. While a solid mass, such as a thymoma or less likely lymphoma is favored, a hyperdense cyst is within the differential considerations. Contrast enhanced MRI examination may be helpful to differentiate solid from cystic nature of the lesion and better assess for subtle enhancement.   4.1 cm low-attenuation lesion within the spleen, possibly representing a pseudo lesion secondary to the relatively early phase of enhancement.   Mild coronary artery calcification   No acute intrathoracic or intra-abdominal pathology identified.   TTE 01/21/2021:  1. Left ventricular ejection fraction, by estimation,  is 65 to 70%. The  left ventricle has normal function. The left ventricle has no regional  wall motion abnormalities. Left ventricular diastolic parameters were  normal.   2. Right ventricular systolic function is normal. The right ventricular  size is normal. There is mildly elevated pulmonary artery systolic  pressure. The estimated right ventricular systolic pressure is 62.2 mmHg.   3. Left atrial size was mildly dilated.   4. The mitral valve is normal in structure. No evidence of mitral valve  regurgitation. No evidence of mitral stenosis.   5. The aortic valve is tricuspid. Aortic valve regurgitation is not  visualized. Mild aortic valve sclerosis is present, with no evidence of  aortic valve stenosis.   6. Aortic dilatation noted. There is mild dilatation of the ascending  aorta, measuring 39 mm. There is mild dilatation of the aortic root,  measuring 39 mm.   7. The inferior vena cava is normal in size with greater than 50%  respiratory variability, suggesting right atrial pressure of 3 mmHg.   Nuclear stress 07/12/2019: Nuclear stress EF: 64%. There was no ST segment deviation noted during stress. The study is normal. The left ventricular ejection fraction is normal (55-65%).   Normal resting and stress perfusion. No ischemia or infarction EF 64%    Wynonia Musty [facility]/Anesthesiology Phone [phone] 01/29/2021 4:45 PM

## 2021-01-29 NOTE — Progress Notes (Signed)
PCP - Dr, Pricilla Holm Cardiologist - Denies  PPM/ICD - Denies  Chest x-ray - 01/29/21 EKG - 01/19/21 Stress Test - 07/12/19 ECHO - 01/21/21 Cardiac Cath - Denies  Sleep Study - Yes, OSA CPAP - Yes  Patient denies having diabetes.  Blood Thinner Instructions: N/A Aspirin Instructions: N/A  ERAS Protcol - Yes  COVID TEST- N/A   Anesthesia review: Yes, previous EKG abnormal  Patient denies shortness of breath, fever, cough and chest pain at PAT appointment   All instructions explained to the patient, with a verbal understanding of the material. Patient agrees to go over the instructions while at home for a better understanding. Patient also instructed to self quarantine after being tested for COVID-19. The opportunity to ask questions was provided.

## 2021-01-30 ENCOUNTER — Encounter (HOSPITAL_COMMUNITY): Payer: Self-pay | Admitting: Thoracic Surgery (Cardiothoracic Vascular Surgery)

## 2021-01-30 ENCOUNTER — Ambulatory Visit (HOSPITAL_COMMUNITY)
Admission: RE | Admit: 2021-01-30 | Discharge: 2021-01-30 | Disposition: A | Discharge status: Home / Self Care | Payer: Medicare Other | Attending: Thoracic Surgery (Cardiothoracic Vascular Surgery) | Admitting: Thoracic Surgery (Cardiothoracic Vascular Surgery)

## 2021-01-30 ENCOUNTER — Ambulatory Visit (HOSPITAL_COMMUNITY): Payer: Medicare Other | Admitting: Physician Assistant

## 2021-01-30 ENCOUNTER — Ambulatory Visit (HOSPITAL_COMMUNITY): Payer: Medicare Other | Admitting: Certified Registered"

## 2021-01-30 ENCOUNTER — Encounter (HOSPITAL_COMMUNITY)
Admission: RE | Disposition: A | Discharge status: Home / Self Care | Payer: Self-pay | Source: Home / Self Care | Attending: Thoracic Surgery (Cardiothoracic Vascular Surgery)

## 2021-01-30 DIAGNOSIS — G43909 Migraine, unspecified, not intractable, without status migrainosus: Secondary | ICD-10-CM | POA: Diagnosis not present

## 2021-01-30 DIAGNOSIS — N400 Enlarged prostate without lower urinary tract symptoms: Secondary | ICD-10-CM | POA: Insufficient documentation

## 2021-01-30 DIAGNOSIS — G4733 Obstructive sleep apnea (adult) (pediatric): Secondary | ICD-10-CM | POA: Insufficient documentation

## 2021-01-30 DIAGNOSIS — I10 Essential (primary) hypertension: Secondary | ICD-10-CM | POA: Insufficient documentation

## 2021-01-30 DIAGNOSIS — C8108 Nodular lymphocyte predominant Hodgkin lymphoma, lymph nodes of multiple sites: Secondary | ICD-10-CM | POA: Insufficient documentation

## 2021-01-30 DIAGNOSIS — E291 Testicular hypofunction: Secondary | ICD-10-CM | POA: Diagnosis not present

## 2021-01-30 DIAGNOSIS — F32A Depression, unspecified: Secondary | ICD-10-CM | POA: Insufficient documentation

## 2021-01-30 DIAGNOSIS — E669 Obesity, unspecified: Secondary | ICD-10-CM | POA: Diagnosis not present

## 2021-01-30 DIAGNOSIS — C8101 Nodular lymphocyte predominant Hodgkin lymphoma, lymph nodes of head, face, and neck: Secondary | ICD-10-CM | POA: Diagnosis not present

## 2021-01-30 DIAGNOSIS — R599 Enlarged lymph nodes, unspecified: Secondary | ICD-10-CM | POA: Diagnosis not present

## 2021-01-30 DIAGNOSIS — R590 Localized enlarged lymph nodes: Secondary | ICD-10-CM | POA: Diagnosis not present

## 2021-01-30 DIAGNOSIS — I700 Atherosclerosis of aorta: Secondary | ICD-10-CM | POA: Insufficient documentation

## 2021-01-30 DIAGNOSIS — J90 Pleural effusion, not elsewhere classified: Secondary | ICD-10-CM | POA: Insufficient documentation

## 2021-01-30 DIAGNOSIS — I2510 Atherosclerotic heart disease of native coronary artery without angina pectoris: Secondary | ICD-10-CM | POA: Insufficient documentation

## 2021-01-30 DIAGNOSIS — Z6837 Body mass index (BMI) 37.0-37.9, adult: Secondary | ICD-10-CM | POA: Insufficient documentation

## 2021-01-30 DIAGNOSIS — M1990 Unspecified osteoarthritis, unspecified site: Secondary | ICD-10-CM | POA: Insufficient documentation

## 2021-01-30 DIAGNOSIS — C8100 Nodular lymphocyte predominant Hodgkin lymphoma, unspecified site: Secondary | ICD-10-CM | POA: Diagnosis not present

## 2021-01-30 HISTORY — PX: SUPRACLAVICAL NODE BIOPSY: SHX5165

## 2021-01-30 SURGERY — BIOPSY, LYMPH NODE, SUPRACLAVICULAR
Anesthesia: General | Site: Neck | Laterality: Right

## 2021-01-30 MED ORDER — PROPOFOL 10 MG/ML IV BOLUS
INTRAVENOUS | Status: AC
  Filled 2021-01-30: qty 20

## 2021-01-30 MED ORDER — OXYCODONE HCL 5 MG PO TABS: 5.0000 mg | ORAL_TABLET | Freq: Once | ORAL | Status: DC | PRN

## 2021-01-30 MED ORDER — FENTANYL CITRATE (PF) 250 MCG/5ML IJ SOLN
INTRAMUSCULAR | Status: DC | PRN
  Administered 2021-01-30: 25 ug via INTRAVENOUS
  Administered 2021-01-30: 50 ug via INTRAVENOUS
  Administered 2021-01-30: 25 ug via INTRAVENOUS
  Administered 2021-01-30: 75 ug via INTRAVENOUS

## 2021-01-30 MED ORDER — HYDROCODONE-ACETAMINOPHEN 5-325 MG PO TABS: 1.0000 | ORAL_TABLET | ORAL | 0 refills | Status: AC | PRN

## 2021-01-30 MED ORDER — ONDANSETRON HCL 4 MG/2ML IJ SOLN
INTRAMUSCULAR | Status: DC | PRN
  Administered 2021-01-30: 4 mg via INTRAVENOUS

## 2021-01-30 MED ORDER — PROPOFOL 1000 MG/100ML IV EMUL
INTRAVENOUS | Status: AC
  Filled 2021-01-30: qty 100

## 2021-01-30 MED ORDER — LIDOCAINE 2% (20 MG/ML) 5 ML SYRINGE
INTRAMUSCULAR | Status: DC | PRN
  Administered 2021-01-30: 60 mg via INTRAVENOUS

## 2021-01-30 MED ORDER — LACTATED RINGERS IV SOLN: INTRAVENOUS | Status: DC

## 2021-01-30 MED ORDER — PROPOFOL 500 MG/50ML IV EMUL
INTRAVENOUS | Status: DC | PRN
  Administered 2021-01-30: 150 ug/kg/min via INTRAVENOUS

## 2021-01-30 MED ORDER — PHENYLEPHRINE 40 MCG/ML (10ML) SYRINGE FOR IV PUSH (FOR BLOOD PRESSURE SUPPORT)
PREFILLED_SYRINGE | INTRAVENOUS | Status: AC
  Filled 2021-01-30: qty 10

## 2021-01-30 MED ORDER — CHLORHEXIDINE GLUCONATE 0.12 % MT SOLN: 15.0000 mL | Freq: Once | OROMUCOSAL | Status: AC

## 2021-01-30 MED ORDER — FENTANYL CITRATE (PF) 100 MCG/2ML IJ SOLN: 25.0000 ug | INTRAMUSCULAR | Status: DC | PRN

## 2021-01-30 MED ORDER — ORAL CARE MOUTH RINSE: 15.0000 mL | Freq: Once | OROMUCOSAL | Status: AC

## 2021-01-30 MED ORDER — PROPOFOL 10 MG/ML IV BOLUS
INTRAVENOUS | Status: DC | PRN
  Administered 2021-01-30: 200 mg via INTRAVENOUS

## 2021-01-30 MED ORDER — PHENYLEPHRINE 40 MCG/ML (10ML) SYRINGE FOR IV PUSH (FOR BLOOD PRESSURE SUPPORT)
PREFILLED_SYRINGE | INTRAVENOUS | Status: DC | PRN
  Administered 2021-01-30: 80 ug via INTRAVENOUS

## 2021-01-30 MED ORDER — CEFAZOLIN SODIUM 1 G IJ SOLR
INTRAMUSCULAR | Status: AC
  Filled 2021-01-30: qty 30

## 2021-01-30 MED ORDER — DEXAMETHASONE SODIUM PHOSPHATE 10 MG/ML IJ SOLN
INTRAMUSCULAR | Status: DC | PRN
  Administered 2021-01-30: 5 mg via INTRAVENOUS

## 2021-01-30 MED ORDER — CHLORHEXIDINE GLUCONATE 0.12 % MT SOLN
OROMUCOSAL | Status: AC
  Administered 2021-01-30: 15 mL via OROMUCOSAL
  Filled 2021-01-30: qty 15

## 2021-01-30 MED ORDER — DEXTROSE 5 % IV SOLN
INTRAVENOUS | Status: DC | PRN
  Administered 2021-01-30: 3 g via INTRAVENOUS

## 2021-01-30 MED ORDER — ONDANSETRON HCL 4 MG/2ML IJ SOLN: 4.0000 mg | Freq: Four times a day (QID) | INTRAMUSCULAR | Status: DC | PRN

## 2021-01-30 MED ORDER — ESMOLOL HCL 100 MG/10ML IV SOLN
INTRAVENOUS | Status: DC | PRN
  Administered 2021-01-30: 30 mg via INTRAVENOUS

## 2021-01-30 MED ORDER — 0.9 % SODIUM CHLORIDE (POUR BTL) OPTIME
TOPICAL | Status: DC | PRN
  Administered 2021-01-30: 1000 mL

## 2021-01-30 MED ORDER — OXYCODONE HCL 5 MG/5ML PO SOLN: 5.0000 mg | Freq: Once | ORAL | Status: DC | PRN

## 2021-01-30 MED ORDER — FENTANYL CITRATE (PF) 250 MCG/5ML IJ SOLN
INTRAMUSCULAR | Status: AC
  Filled 2021-01-30: qty 5

## 2021-01-30 MED ORDER — LIDOCAINE-EPINEPHRINE (PF) 1 %-1:200000 IJ SOLN
INTRAMUSCULAR | Status: AC
  Filled 2021-01-30: qty 30

## 2021-01-30 SURGICAL SUPPLY — 41 items
ADH SKN CLS APL DERMABOND .7 (GAUZE/BANDAGES/DRESSINGS) ×1
BLADE CLIPPER SURG (BLADE) ×2 IMPLANT
CANISTER SUCT 3000ML PPV (MISCELLANEOUS) ×2 IMPLANT
CLIP VESOCCLUDE MED 6/CT (CLIP) ×3 IMPLANT
CLIP VESOCCLUDE SM WIDE 6/CT (CLIP) ×3 IMPLANT
CNTNR URN SCR LID CUP LEK RST (MISCELLANEOUS) ×2 IMPLANT
CONT SPEC 4OZ STRL OR WHT (MISCELLANEOUS) ×4
COVER SURGICAL LIGHT HANDLE (MISCELLANEOUS) ×4 IMPLANT
DERMABOND ADVANCED (GAUZE/BANDAGES/DRESSINGS) ×1
DERMABOND ADVANCED .7 DNX12 (GAUZE/BANDAGES/DRESSINGS) ×1 IMPLANT
DRAPE CHEST BREAST 15X10 FENES (DRAPES) ×2 IMPLANT
DRAPE HALF SHEET 40X57 (DRAPES) ×1 IMPLANT
ELECT CAUTERY BLADE 6.4 (BLADE) ×2 IMPLANT
ELECT REM PT RETURN 9FT ADLT (ELECTROSURGICAL) ×2
ELECTRODE REM PT RTRN 9FT ADLT (ELECTROSURGICAL) ×1 IMPLANT
GAUZE 4X4 16PLY ~~LOC~~+RFID DBL (SPONGE) ×1 IMPLANT
GAUZE SPONGE 4X4 12PLY STRL (GAUZE/BANDAGES/DRESSINGS) ×2 IMPLANT
GLOVE SURG SIGNA 7.5 PF LTX (GLOVE) ×2 IMPLANT
GOWN STRL REUS W/ TWL XL LVL3 (GOWN DISPOSABLE) ×1 IMPLANT
GOWN STRL REUS W/TWL XL LVL3 (GOWN DISPOSABLE) ×2
HEMOSTAT SURGICEL 2X14 (HEMOSTASIS) IMPLANT
KIT BASIN OR (CUSTOM PROCEDURE TRAY) ×2 IMPLANT
KIT TURNOVER KIT B (KITS) ×2 IMPLANT
NEEDLE 22X1 1/2 (OR ONLY) (NEEDLE) IMPLANT
NS IRRIG 1000ML POUR BTL (IV SOLUTION) ×2 IMPLANT
PACK GENERAL/GYN (CUSTOM PROCEDURE TRAY) ×2 IMPLANT
PAD ARMBOARD 7.5X6 YLW CONV (MISCELLANEOUS) ×4 IMPLANT
SPONGE INTESTINAL PEANUT (DISPOSABLE) ×2 IMPLANT
SPONGE T-LAP 18X18 ~~LOC~~+RFID (SPONGE) ×1 IMPLANT
SUT SILK 2 0 (SUTURE) ×2
SUT SILK 2-0 18XBRD TIE 12 (SUTURE) ×1 IMPLANT
SUT VIC AB 2-0 CT1 27 (SUTURE) ×2
SUT VIC AB 2-0 CT1 TAPERPNT 27 (SUTURE) IMPLANT
SUT VIC AB 3-0 SH 27 (SUTURE) ×2
SUT VIC AB 3-0 SH 27X BRD (SUTURE) ×1 IMPLANT
SUT VIC AB 3-0 X1 27 (SUTURE) IMPLANT
SUT VICRYL 4-0 PS2 18IN ABS (SUTURE) ×2 IMPLANT
SYR CONTROL 10ML LL (SYRINGE) IMPLANT
TOWEL GREEN STERILE (TOWEL DISPOSABLE) ×2 IMPLANT
TOWEL GREEN STERILE FF (TOWEL DISPOSABLE) ×2 IMPLANT
WATER STERILE IRR 1000ML POUR (IV SOLUTION) ×2 IMPLANT

## 2021-01-30 NOTE — Anesthesia Procedure Notes (Addendum)
Procedure Name: LMA Insertion Date/Time: 01/30/2021 11:04 AM Performed by: Imagene Riches, CRNA Pre-anesthesia Checklist: Patient identified, Emergency Drugs available, Suction available and Patient being monitored Patient Re-evaluated:Patient Re-evaluated prior to induction Oxygen Delivery Method: Circle System Utilized Preoxygenation: Pre-oxygenation with 100% oxygen Induction Type: IV induction Ventilation: Mask ventilation without difficulty LMA: LMA inserted LMA Size: 5.0 Number of attempts: 1 Airway Equipment and Method: Bite block Placement Confirmation: positive ETCO2 Tube secured with: Tape Dental Injury: Teeth and Oropharynx as per pre-operative assessment

## 2021-01-30 NOTE — Interval H&P Note (Signed)
History and Physical Interval Note:  01/30/2021 10:46 AM  Michael Conway  has presented today for surgery, with the diagnosis of RIGHT SUPRACLAVICULAR ADENOPATHY.  The various methods of treatment have been discussed with the patient and family. After consideration of risks, benefits and other options for treatment, the patient has consented to  Procedure(s): SUPRACLAVICULAR LYMPH NODE BIOPSY (Right) as a surgical intervention.  The patient's history has been reviewed, patient examined, no change in status, stable for surgery.  I have reviewed the patient's chart and labs.  Questions were answered to the patient's satisfaction.     [name]

## 2021-01-30 NOTE — Brief Op Note (Signed)
01/30/2021  12:34 PM  PATIENT:  Michael Conway  70 y.o. male  PRE-OPERATIVE DIAGNOSIS:  RIGHT SUPRACLAVICULAR ADENOPATHY  POST-OPERATIVE DIAGNOSIS:  RIGHT SUPRACLAVICULAR ADENOPATHY  PROCEDURE:  Procedure(s): EXCISION RIGHT SUPRACLAVICULAR LYMPH NODE (Right)  SURGEON:  Surgeon(s) and Role:    * [name], MD - Primary  PHYSICIAN ASSISTANT:   ASSISTANTS: none   ANESTHESIA:   general  EBL:  30 mL   BLOOD ADMINISTERED:none  DRAINS: none   LOCAL MEDICATIONS USED:  NONE  SPECIMEN:  Source of Specimen:  Supraclavicular lymph nodes  DISPOSITION OF SPECIMEN:  PATHOLOGY  COUNTS:  YES  TOURNIQUET:  * No tourniquets in log *  DICTATION: .Other Dictation: Dictation Number 1  PLAN OF CARE: Discharge to home after PACU  PATIENT DISPOSITION:  PACU - hemodynamically stable.   Delay start of Pharmacological VTE agent (>24hrs) due to surgical blood loss or risk of bleeding: not applicable

## 2021-01-30 NOTE — Discharge Instructions (Addendum)
Do not drive or engage in heavy physical activity for 48 hours.  You may shower tomorrow.  There is a medical adhesive over the incision. It will start to come off in about 2 weeks.  You have a prescription for Vicodin you may take 1-2 tablets every 4 times a day as needed for pain. Do NOT exceed 12 tablets in a day as it contains acetaminophen (Tylenol) and can cause liver damage if you take too much.  Call [phone] if you develop chest pain, shortness of breath, fever > 101 F or notice excessive swelling, redness or tenderness at the incision.  My office will contact you with a follow up appointment and also an appointment with an Oncologist to plan treatment.

## 2021-01-30 NOTE — Anesthesia Postprocedure Evaluation (Signed)
Anesthesia Post Note  Patient: Michael Conway  Procedure(s) Performed: EXCISION RIGHT SUPRACLAVICULAR LYMPH NODE (Right: Neck)     Patient location during evaluation: PACU Anesthesia Type: General Level of consciousness: awake and alert Pain management: pain level controlled Vital Signs Assessment: post-procedure vital signs reviewed and stable Respiratory status: spontaneous breathing, nonlabored ventilation, respiratory function stable and patient connected to nasal cannula oxygen Cardiovascular status: blood pressure returned to baseline and stable Postop Assessment: no apparent nausea or vomiting Anesthetic complications: no   No notable events documented.  Last Vitals:  Vitals:   01/30/21 1300 01/30/21 1315  BP: (!) 117/93 (!) 120/99  Pulse: (!) 124 (!) 123  Resp: 20 18  Temp:  (!) 36.3 C  SpO2: 94% 95%    Last Pain:  Vitals:   01/30/21 1315  TempSrc:   PainSc: 0-No pain                 Kennedy Bohanon S

## 2021-01-30 NOTE — Transfer of Care (Signed)
Immediate Anesthesia Transfer of Care Note  Patient: Michael Conway  Procedure(s) Performed: EXCISION RIGHT SUPRACLAVICULAR LYMPH NODE (Right: Neck)  Patient Location: PACU  Anesthesia Type:General  Level of Consciousness: drowsy  Airway & Oxygen Therapy: Patient Spontanous Breathing and Patient connected to nasal cannula oxygen  Post-op Assessment: Report given to RN and Post -op Vital signs reviewed and stable  Post vital signs: Reviewed and stable  Last Vitals:  Vitals Value Taken Time  BP 135/99 01/30/21 1230  Temp 36.4 C 01/30/21 1230  Pulse 124 01/30/21 1231  Resp 14 01/30/21 1231  SpO2 93 % 01/30/21 1231  Vitals shown include unvalidated device data.  Last Pain:  Vitals:   01/30/21 1230  TempSrc:   PainSc: 0-No pain         Complications: No notable events documented.

## 2021-01-31 ENCOUNTER — Encounter (HOSPITAL_COMMUNITY): Payer: Self-pay | Admitting: Thoracic Surgery (Cardiothoracic Vascular Surgery)

## 2021-01-31 NOTE — Op Note (Signed)
NAME: Michael Conway, Michael Conway MEDICAL RECORD NO: [id number] ACCOUNT NO: [id number] DATE OF BIRTH: 01-20-1951 FACILITY: [facility] LOCATION: [facility] PHYSICIAN: Revonda Standard. Roxan Hockey, MD  Operative Report   DATE OF PROCEDURE: 01/30/2021  PREOPERATIVE DIAGNOSIS:  Right supraclavicular adenopathy.  POSTOPERATIVE DIAGNOSIS:  Right supraclavicular adenopathy.  PROCEDURE:  Excision of right supraclavicular lymph node.  SURGEON:  Revonda Standard. Roxan Hockey, MD  ASSISTANT:  None.  ANESTHESIA:  General.  FINDINGS:  Markedly enlarged right supraclavicular lymph node, frozen section probable lymphoma.  CLINICAL NOTE:  The patient is a 70 year old man who presented with chest pain.  He was found to have a large anterior mediastinal mass.  A PET/CT showed this mass was hypermetabolic.  There also was a hypermetabolic lesion in the spleen and the right  supraclavicular lymph nodes.  He was advised to undergo a supraclavicular lymph node biopsy for diagnostic purposes.  The indications, risks, benefits, and alternatives were discussed in detail with the patient.  He understood and accepted the risks and  agreed to proceed.  DESCRIPTION OF PROCEDURE:  The patient was brought to the operating room on 01/30/2021.  He had induction of anesthesia.  General anesthesia with a laryngeal mask airway was used.  Intravenous antibiotics were administered.  The neck and chest were  prepped and draped in the usual sterile fashion.  A timeout was performed.  Incision was made in the right supraclavicular fossa.  It was carried through the skin and subcutaneous tissue.  The platysma was divided with cautery.  Hemostasis was achieved.  Dissection then was carried behind the head of the  clavicle.  The jugular vein was retracted anteriorly.  Two small lymph nodes were identified and when these were removed, the much larger nodal mass was present underneath that.  This nodal mass was about 2 x 3 cm in size.  It was removed clipping  the  blood vessels and sent for frozen section.  Inspection was made for hemostasis.  The wound was copiously irrigated with saline.  The incision was closed in 2 layers.  Dermabond was applied.  The patient was then awakened from anesthesia and taken from  the operating room to the postanesthetic care unit in good condition.   VAI D: 01/30/2021 2:29:03 pm T: 01/31/2021 1:05:00 am  JOB: [id number]/ [id number]

## 2021-02-02 ENCOUNTER — Encounter: Payer: Self-pay | Admitting: Thoracic Surgery (Cardiothoracic Vascular Surgery)

## 2021-02-02 ENCOUNTER — Telehealth: Payer: Self-pay | Admitting: Hematology

## 2021-02-02 NOTE — Telephone Encounter (Signed)
Scheduled appt per 11/5 referral. Pt is aware of appt date and time.

## 2021-02-03 ENCOUNTER — Other Ambulatory Visit: Payer: Self-pay

## 2021-02-03 ENCOUNTER — Ambulatory Visit (INDEPENDENT_AMBULATORY_CARE_PROVIDER_SITE_OTHER): Payer: Medicare Other

## 2021-02-03 DIAGNOSIS — Z5189 Encounter for other specified aftercare: Secondary | ICD-10-CM | POA: Insufficient documentation

## 2021-02-03 DIAGNOSIS — Z4889 Encounter for other specified surgical aftercare: Secondary | ICD-10-CM

## 2021-02-03 NOTE — Progress Notes (Signed)
Patient stopped by the office this morning concerned about his incision site s/p biopsy and excision of right supraclavicular lymph node. He states that last night the swelling on the incision site was a lot more than it was today. Upon observation of the incision, it is swollen, no redness or warmth. He swelling noted is soft when palpated. Patient stated that it does not hurt or bother him, just concerning. Advised that Dr. Roxan Hockey would be made aware of changes but to give the office a call if he started to experiencing swelling that continues to get worse, or if signs/ symptoms began to show (redness, tenderness, swelling, foul odor, purulent drainage, temperature). He acknowledged receipt. Also advised to keep clean and dry and to minimize touching the actual incision until completely healed. He also acknowledged receipt.

## 2021-02-05 ENCOUNTER — Telehealth: Payer: Self-pay

## 2021-02-05 LAB — SURGICAL PATHOLOGY

## 2021-02-05 NOTE — Telephone Encounter (Signed)
Left voicemail message for return call. Advised patient that incision looks ok and nothing to be done at this time.

## 2021-02-05 NOTE — Telephone Encounter (Signed)
-----   Message from [name], MD sent at 02/03/2021  5:08 PM EST ----- Not infected, probably a little lymphocele.   [facility] ----- Message ----- From: Donnella Sham, RN Sent: 02/03/2021  10:01 AM EST To: [name], MD  Hey, patient stopped into the office this morning concerned about his biopsy incision. Please see attached for pictures and advised if needed. It does not hurt and does not look infected, it is swollen at the incision. It is not hard, but soft fluid filled. Stated that it looks better than it did last night. Swelling has gone down since.  Thanks,  Caryl Pina

## 2021-02-12 ENCOUNTER — Telehealth: Payer: Self-pay | Admitting: Internal Medicine

## 2021-02-12 DIAGNOSIS — J9859 Other diseases of mediastinum, not elsewhere classified: Secondary | ICD-10-CM

## 2021-02-12 DIAGNOSIS — C8190 Hodgkin lymphoma, unspecified, unspecified site: Secondary | ICD-10-CM

## 2021-02-12 NOTE — Telephone Encounter (Signed)
See below

## 2021-02-12 NOTE — Telephone Encounter (Signed)
Patient calling in  Patient requesting referral be sent to [facility] on behalf of patient to see oncologist  Attn: Marliss Coots [phone] (F)

## 2021-02-13 ENCOUNTER — Encounter: Payer: Self-pay | Admitting: Internal Medicine

## 2021-02-13 NOTE — Telephone Encounter (Signed)
Referral done

## 2021-02-17 ENCOUNTER — Encounter: Payer: Self-pay | Admitting: Physician Assistant

## 2021-02-17 ENCOUNTER — Telehealth: Payer: Self-pay | Admitting: Internal Medicine

## 2021-02-17 ENCOUNTER — Ambulatory Visit (INDEPENDENT_AMBULATORY_CARE_PROVIDER_SITE_OTHER): Payer: Medicare Other | Admitting: Physician Assistant

## 2021-02-17 ENCOUNTER — Other Ambulatory Visit: Payer: Self-pay

## 2021-02-17 VITALS — BP 122/78 | HR 128 | Resp 20 | Ht 71.0 in | Wt 272.0 lb

## 2021-02-17 DIAGNOSIS — C8109 Nodular lymphocyte predominant Hodgkin lymphoma, extranodal and solid organ sites: Secondary | ICD-10-CM

## 2021-02-17 DIAGNOSIS — R590 Localized enlarged lymph nodes: Secondary | ICD-10-CM

## 2021-02-17 DIAGNOSIS — Z5189 Encounter for other specified aftercare: Secondary | ICD-10-CM

## 2021-02-17 NOTE — Patient Instructions (Signed)
Follow up as needed

## 2021-02-17 NOTE — Telephone Encounter (Signed)
Michael Conway from [facility]) called and states that pt. Referral to Hematology Oncology has not been received. Phone number was inserted, instead of fax number. Please fax to [phone].

## 2021-02-17 NOTE — Progress Notes (Signed)
HPI: Michael Conway is a 70 year old man who recently presented with chest pain.  Work-up revealed an anterior mediastinal mass.  He has now had a PET/CT which showed additional findings of hypermetabolic lymph nodes and a large splenic mass that is hypermetabolic. Patient returns for routine postoperative follow-up having undergone right supraclavicular lymph node biopsy by Dr. Roxan Hockey on 01/30/2021 as an outpatient procedure.    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  A referral has been made to oncology and he is scheduled to see Dr. Irene Limbo on Monday, 02/23/2021.  Michael Conway also related that he has a personal friend who is an Scientist, physiological for the oncology department at [facility].  He is planning on arranging for an appointment for second opinion with that service.  Michael Conway says he continues to have some chest pain as he was having prior to his initial presentation to emergency room last month.  He is taking Vicodin about 4 times a day to keep this under control.  His breathing is about the same that he gets fatigued and short winded easily.  Swelling in the right neck incision has continued to abate and he is not concerned with this wound at all.   Current Outpatient Medications  Medication Sig Dispense Refill   Cholecalciferol (VITAMIN D3) 125 MCG (5000 UT) CAPS Take 5,000 Units by mouth every other day.     HYDROcodone-acetaminophen (VICODIN) 5-325 mg TABS tablet      Magnesium 500 MG TABS Take 500 mg by mouth daily.     Multiple Vitamins-Minerals (CENTRUM SILVER 50+MEN PO) Take 1 tablet by mouth daily with breakfast.     naproxen sodium (ALEVE) 220 MG tablet Take 220-440 mg by mouth 2 (two) times daily as needed (for headaches or migraines).     OnabotulinumtoxinA (BOTOX IJ) Inject as directed every 3 (three) months.     polyethylene glycol powder (GLYCOLAX/MIRALAX) 17 GM/SCOOP powder Take 17 g by mouth 2  (two) times daily as needed. (Patient taking differently: Take 17 g by mouth 2 (two) times daily.) 3350 g 1   Polyvinyl Alcohol-Povidone PF (REFRESH) 1.4-0.6 % SOLN Place 1 drop into both eyes daily as needed (dry eye).     tamsulosin (FLOMAX) 0.4 MG CAPS capsule Take 1 capsule (0.4 mg total) by mouth daily. 90 capsule 3   testosterone cypionate (DEPOTESTOSTERONE CYPIONATE) 200 MG/ML injection Inject 200 mg into the muscle every 14 (fourteen) days.     triamcinolone cream (KENALOG) 0.1 % Apply 1 application topically daily as needed (Eczema).     venlafaxine XR (EFFEXOR XR) 37.5 MG 24 hr capsule Take 1 capsule daily with breakfast for one week, then increase to 2 capsules daily with breakfast. 60 capsule 0   Whey Protein POWD Take 1 Scoop by mouth See admin instructions. Mix 1 scoopful into preferred beverage and drink by mouth once a day     Zinc 50 MG TABS Take 50 mg by mouth daily.     No current facility-administered medications for this visit.    Physical Exam: BP 122/78 Heart rate 110 Respirations 20 O2 sat 95% on room air  Diagnostic Tests: None today  Impression / Plan: Michael Conway is a 70 year old man who recently presented with chest pain.  Work-up revealed an anterior mediastinal mass.  He has now had a PET/CT which showed additional findings of hypermetabolic lymph nodes and a large splenic mass that is hypermetabolic.  He is status post right  supraclavicular lymph node biopsy by Dr. Roxan Hockey on 01/30/2021.  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  The biopsy site is intact, dry, and healing with no sign of complication.  Swelling at the site is resolving. No further surgical intervention is anticipated.  Michael Conway was seen Dr. Heloise Beecham on Monday, 02/23/2021 to discuss treatment options.  He also is planning to schedule for a second opinion at [facility] of oncology through a friend who works with that department.  No  further follow-up is scheduled with our service but I assured Michael Conway would be very happy to see him if we can assist further.   Antony Odea, PA-C [facility] [phone]

## 2021-02-23 ENCOUNTER — Telehealth: Payer: Self-pay | Admitting: Internal Medicine

## 2021-02-23 ENCOUNTER — Other Ambulatory Visit: Payer: Self-pay

## 2021-02-23 ENCOUNTER — Inpatient Hospital Stay: Payer: Medicare Other | Attending: Hematology

## 2021-02-23 ENCOUNTER — Inpatient Hospital Stay (HOSPITAL_BASED_OUTPATIENT_CLINIC_OR_DEPARTMENT_OTHER): Payer: Medicare Other | Admitting: Hematology

## 2021-02-23 VITALS — BP 137/83 | HR 88 | Temp 97.5°F | Resp 20 | Wt 274.2 lb

## 2021-02-23 DIAGNOSIS — Z801 Family history of malignant neoplasm of trachea, bronchus and lung: Secondary | ICD-10-CM

## 2021-02-23 DIAGNOSIS — Z87891 Personal history of nicotine dependence: Secondary | ICD-10-CM

## 2021-02-23 DIAGNOSIS — Z8572 Personal history of non-Hodgkin lymphomas: Secondary | ICD-10-CM | POA: Diagnosis not present

## 2021-02-23 DIAGNOSIS — Z79899 Other long term (current) drug therapy: Secondary | ICD-10-CM | POA: Diagnosis not present

## 2021-02-23 DIAGNOSIS — Z7189 Other specified counseling: Secondary | ICD-10-CM

## 2021-02-23 DIAGNOSIS — C8108 Nodular lymphocyte predominant Hodgkin lymphoma, lymph nodes of multiple sites: Secondary | ICD-10-CM

## 2021-02-23 DIAGNOSIS — Z8042 Family history of malignant neoplasm of prostate: Secondary | ICD-10-CM | POA: Diagnosis not present

## 2021-02-23 DIAGNOSIS — C8110 Nodular sclerosis classical Hodgkin lymphoma, unspecified site: Secondary | ICD-10-CM

## 2021-02-23 DIAGNOSIS — R0789 Other chest pain: Secondary | ICD-10-CM

## 2021-02-23 DIAGNOSIS — Z114 Encounter for screening for human immunodeficiency virus [HIV]: Secondary | ICD-10-CM

## 2021-02-23 DIAGNOSIS — G4730 Sleep apnea, unspecified: Secondary | ICD-10-CM | POA: Diagnosis not present

## 2021-02-23 LAB — CMP (CANCER CENTER ONLY)
ALT: 14 U/L (ref 0–44)
AST: 16 U/L (ref 15–41)
Albumin: 3.9 g/dL (ref 3.5–5.0)
Alkaline Phosphatase: 95 U/L (ref 38–126)
Anion gap: 10 (ref 5–15)
BUN: 18 mg/dL (ref 8–23)
CO2: 25 mmol/L (ref 22–32)
Calcium: 9.4 mg/dL (ref 8.9–10.3)
Chloride: 105 mmol/L (ref 98–111)
Creatinine: 1.15 mg/dL (ref 0.61–1.24)
GFR, Estimated: >60 mL/min (ref >60)
Glucose, Bld: 97 mg/dL (ref 70–99)
Potassium: 4 mmol/L (ref 3.5–5.1)
Sodium: 140 mmol/L (ref 135–145)
Total Bilirubin: 0.7 mg/dL (ref 0.3–1.2)
Total Protein: 7.6 g/dL (ref 6.5–8.1)

## 2021-02-23 LAB — CBC WITH DIFFERENTIAL/PLATELET
Abs Immature Granulocytes: 0.03 10*3/uL (ref 0.00–0.07)
Basophils Absolute: 0 10*3/uL (ref 0.0–0.1)
Basophils Relative: 0 %
Eosinophils Absolute: 0.3 10*3/uL (ref 0.0–0.5)
Eosinophils Relative: 3 %
HCT: 47.3 % (ref 39.0–52.0)
Hemoglobin: 16.4 g/dL (ref 13.0–17.0)
Immature Granulocytes: 0 %
Lymphocytes Relative: 19 %
Lymphs Abs: 1.8 10*3/uL (ref 0.7–4.0)
MCH: 29.1 pg (ref 26.0–34.0)
MCHC: 34.7 g/dL (ref 30.0–36.0)
MCV: 83.9 fL (ref 80.0–100.0)
Monocytes Absolute: 0.7 10*3/uL (ref 0.1–1.0)
Monocytes Relative: 7 %
Neutro Abs: 6.6 10*3/uL (ref 1.7–7.7)
Neutrophils Relative %: 71 %
Platelets: 219 10*3/uL (ref 150–400)
RBC: 5.64 MIL/uL (ref 4.22–5.81)
RDW: 13.5 % (ref 11.5–15.5)
WBC: 9.5 10*3/uL (ref 4.0–10.5)
nRBC: 0 % (ref 0.0–0.2)

## 2021-02-23 LAB — HEPATITIS B SURFACE ANTIGEN: Hepatitis B Surface Ag: NONREACTIVE

## 2021-02-23 LAB — LACTATE DEHYDROGENASE: LDH: 215 U/L — ABNORMAL HIGH (ref 98–192)

## 2021-02-23 LAB — HEPATITIS C ANTIBODY: HCV Ab: NONREACTIVE

## 2021-02-23 LAB — SEDIMENTATION RATE: Sed Rate: 23 mm/hr — ABNORMAL HIGH (ref 0–16)

## 2021-02-23 LAB — HEPATITIS B CORE ANTIBODY, TOTAL: Hep B Core Total Ab: NONREACTIVE

## 2021-02-23 MED ORDER — PREDNISONE 20 MG PO TABS
60.0000 mg | ORAL_TABLET | Freq: Every day | ORAL | 0 refills | Status: AC
Start: 2021-02-23 — End: 2021-03-02

## 2021-02-23 MED ORDER — SENNOSIDES-DOCUSATE SODIUM 8.6-50 MG PO TABS: 2.0000 | ORAL_TABLET | Freq: Every day | ORAL | 1 refills | Status: DC

## 2021-02-23 NOTE — Progress Notes (Signed)
[facility]   HEMATOLOGY/ONCOLOGY CONSULTATION NOTE  Date of Service: 02/23/2021  Patient Care Team: Hoyt Koch, MD as PCP - General (Internal Medicine) Pieter Partridge, DO as Consulting Physician (Neurology)  CHIEF COMPLAINTS/PURPOSE OF CONSULTATION:  Nodular Lymphocyte predominant Hodgkins lymphoma  HISTORY OF PRESENTING ILLNESS:   Michael Conway is a wonderful 70 y.o. male who has been referred to [place] by Dr Merilynn Finland for evaluation and management of newly diagnosed nodular lymphocyte predominant Hodgkin's lymphoma.  Patient has a history of migraine headaches, allergies, depression, hearing impairment, obstructive sleep apnea not on CPAP treatment, vestibular migraines with intermittent dizziness Who first presented to the emergency room on 01/07/2021 with severe chest pain radiating to the neck.  He had serial troponins which ruled out an acute myocardial infarction. Patient had a CTA chest abdomen pelvis to rule out aortic dissection on 01/07/2021 which showed No evidence of thoracic aortic aneurysm or dissection. Mild atherosclerotic calcification noted throughout the thoracoabdominal aorta. No hemodynamically significant stenosis.   4.6 by 6.2 cm lobulated hyperdense lesion within the anterior mediastinum. While a solid mass, such as a thymoma or less likely lymphoma is favored, a hyperdense cyst is within the differential considerations. Contrast enhanced MRI examination may be helpful to differentiate solid from cystic nature of the lesion and better assess for subtle enhancement.  4.1 cm low-attenuation lesion within the spleen, possibly representing a pseudo lesion secondary to the relatively early phase of enhancement.  Mild coronary artery calcification. No acute intrathoracic or intra-abdominal pathology identified.  Patient was subsequently referred to cardiothoracic surgery and was seen by Dr. Modesto Charon and had a PET CT scan on 01/16/2021 which showed 1.  The anterior mediastinal and medial splenic masses are both hypermetabolic, most consistent with lymphoma. ([place]) 5 2. Thoracic and equivocal abdominal nodal involvement as detailed above. The anterior mediastinal mass is hypermetabolic. Example 5.9 x 4.7 cm and a S.U.V. max of 7.3 on 66/4. Small left axillary nodes are hypermetabolic. Example at a S.U.V. max of 3.9 on 61/4. An aortocaval 7 mm node demonstrates equivocal hypermetabolism at a S.U.V. max of 2.7 on 133/4. 3. New small right and trace left pleural effusions.  Patient did have an echocardiogram with Dr. Percival Spanish on 01/21/2021 which showed normal ejection fraction of 65 to 70% with no regional wall motion abnormalities.  Patient subsequently had a biopsy of his right supraclavicular lymph node on 01/30/2021 which showed findings consistent with nodular lymphocyte predominant Hodgkin's lymphoma  Patient still notes some positional chest pain especially when sleeping in bed or bending forward.  No fevers no chills.  No new lumps or bumps noted.  No acute shortness of breath at this time but does note some dyspnea on exertion. Patient notes no significant weight loss. No other acute new focal symptoms.  MEDICAL HISTORY:  Past Medical History:  Diagnosis Date   Allergy    Arthritis    Colon polyps    Depression    Hearing impaired person, bilateral    Low testosterone    Migraines    OSA (obstructive sleep apnea) 09/27/2019   Vestibular migraine     SURGICAL HISTORY: Past Surgical History:  Procedure Laterality Date   BICEPS TENDON REPAIR Left    INNER EAR SURGERY Right    KNEE ARTHROSCOPY Left    ROTATOR CUFF REPAIR Bilateral    SUPRACLAVICAL NODE BIOPSY Right 01/30/2021   Procedure: EXCISION RIGHT SUPRACLAVICULAR LYMPH NODE;  Surgeon: [name], MD;  Location: [facility];  Service:  Thoracic;  Laterality: Right;   TYMPANOSTOMY TUBE PLACEMENT     WISDOM TOOTH EXTRACTION      SOCIAL HISTORY: Social History    Socioeconomic History   Marital status: Single    Spouse name: Not on file   Number of children: 2   Years of education: Not on file   Highest education level: Bachelor's degree (e.g., BA, AB, BS)  Occupational History   Occupation: retired  Tobacco Use   Smoking status: Former    Types: Cigarettes    Quit date: 2002    Years since quitting: 20.9   Smokeless tobacco: Never  Vaping Use   Vaping Use: Never used  Substance and Sexual Activity   Alcohol use: Yes    Comment: occasionally   Drug use: No   Sexual activity: Yes    Partners: Female  Other Topics Concern   Not on file  Social History Narrative   Patient is right-handed. He lives in a single level home. He drinks 3 cups of coffee a day and rare tea or soda. He walks daily.       Education - college grad   Social Determinants of Radio broadcast assistant Strain: Not on file  Food Insecurity: Not on file  Transportation Needs: Not on file  Physical Activity: Not on file  Stress: Not on file  Social Connections: Not on file  Intimate Partner Violence: Not on file    FAMILY HISTORY: Family History  Problem Relation Age of Onset   Prostate cancer Other    Heart attack Mother 73   Heart failure Mother    Hypertension Mother    Dementia Father 72   Parkinsonism Father    Colon polyps Father    Lung cancer Sister    Melanoma Sister    Lung disease Sister    Hypertension Sister    Colon cancer Neg Hx    Stomach cancer Neg Hx    Rectal cancer Neg Hx    Esophageal cancer Neg Hx     ALLERGIES:  is allergic to peppermint oil, benzodiazepines, and other.  MEDICATIONS:  Current Outpatient Medications  Medication Sig Dispense Refill   Cholecalciferol (VITAMIN D3) 125 MCG (5000 UT) CAPS Take 5,000 Units by mouth every other day.     HYDROcodone-acetaminophen (VICODIN) 5-325 mg TABS tablet      Magnesium 500 MG TABS Take 500 mg by mouth daily.     Multiple Vitamins-Minerals (CENTRUM SILVER 50+MEN PO) Take  1 tablet by mouth daily with breakfast.     naproxen sodium (ALEVE) 220 MG tablet Take 220-440 mg by mouth 2 (two) times daily as needed (for headaches or migraines).     OnabotulinumtoxinA (BOTOX IJ) Inject as directed every 3 (three) months.     polyethylene glycol powder (GLYCOLAX/MIRALAX) 17 GM/SCOOP powder Take 17 g by mouth 2 (two) times daily as needed. (Patient taking differently: Take 17 g by mouth 2 (two) times daily.) 3350 g 1   Polyvinyl Alcohol-Povidone PF (REFRESH) 1.4-0.6 % SOLN Place 1 drop into both eyes daily as needed (dry eye).     tamsulosin (FLOMAX) 0.4 MG CAPS capsule Take 1 capsule (0.4 mg total) by mouth daily. 90 capsule 3   testosterone cypionate (DEPOTESTOSTERONE CYPIONATE) 200 MG/ML injection Inject 200 mg into the muscle every 14 (fourteen) days.     triamcinolone cream (KENALOG) 0.1 % Apply 1 application topically daily as needed (Eczema).     venlafaxine XR (EFFEXOR XR) 37.5 MG 24 hr  capsule Take 1 capsule daily with breakfast for one week, then increase to 2 capsules daily with breakfast. 60 capsule 0   Whey Protein POWD Take 1 Scoop by mouth See admin instructions. Mix 1 scoopful into preferred beverage and drink by mouth once a day     Zinc 50 MG TABS Take 50 mg by mouth daily.     No current facility-administered medications for this visit.    REVIEW OF SYSTEMS:    10 Point review of Systems was done is negative except as noted above.  PHYSICAL EXAMINATION: ECOG PERFORMANCE STATUS: 2 - Symptomatic, <50% confined to bed  . Vitals:   02/23/21 1252  BP: 137/83  Pulse: 88  Resp: 20  Temp: (!) 97.5 F (36.4 C)  SpO2: 99%   Filed Weights   02/23/21 1252  Weight: 274 lb 3.2 oz (124.4 kg)   .Body mass index is 38.24 kg/m.  GENERAL:alert, in no acute distress and comfortable SKIN: no acute rashes, no significant lesions EYES: conjunctiva are pink and non-injected, sclera anicteric OROPHARYNX: MMM, no exudates, no oropharyngeal erythema or  ulceration NECK: supple, no JVD LYMPH:  no palpable lymphadenopathy in the cervical, axillary or inguinal regions LUNGS: clear to auscultation b/l with normal respiratory effort HEART: regular rate & rhythm ABDOMEN:  normoactive bowel sounds , non tender, not distended. Extremity: no pedal edema PSYCH: alert & oriented x 3 with fluent speech NEURO: no focal motor/sensory deficits  LABORATORY DATA:  I have reviewed the data as listed  . CBC Latest Ref Rng & Units 02/23/2021 01/29/2021 01/07/2021  WBC 4.0 - 10.5 K/uL 9.5 11.1(H) 16.7(H)  Hemoglobin 13.0 - 17.0 g/dL 16.4 16.4 17.7(H)  Hematocrit 39.0 - 52.0 % 47.3 50.3 51.0  Platelets 150 - 400 K/uL 219 314 191    . CMP Latest Ref Rng & Units 02/23/2021 01/29/2021 01/07/2021  Glucose 70 - 99 mg/dL 97 106(H) 100(H)  BUN 8 - 23 mg/dL 18 19 23   Creatinine 0.61 - 1.24 mg/dL 1.15 1.22 1.15  Sodium 135 - 145 mmol/L 140 136 135  Potassium 3.5 - 5.1 mmol/L 4.0 4.8 4.7  Chloride 98 - 111 mmol/L 105 103 103  CO2 22 - 32 mmol/L 25 26 25   Calcium 8.9 - 10.3 mg/dL 9.4 9.0 8.9  Total Protein 6.5 - 8.1 g/dL 7.6 6.7 -  Total Bilirubin 0.3 - 1.2 mg/dL 0.7 0.7 -  Alkaline Phos 38 - 126 U/L 95 141(H) -  AST 15 - 41 U/L 16 32 -  ALT 0 - 44 U/L 14 28 -     RADIOGRAPHIC STUDIES: I have personally reviewed the radiological images as listed and agreed with the findings in the report. DG Chest 2 View  Result Date: 01/30/2021 CLINICAL DATA:  Pre-admission. Patient for supraclavicular adenopathy biopsy. EXAM: CHEST - 2 VIEW COMPARISON:  01/07/2021 FINDINGS: Cardiac silhouette is normal in size. No mediastinal or hilar masses. No evidence of adenopathy. Linear opacity noted in the right middle lobe consistent with atelectasis. Lungs are otherwise clear. No pleural effusion or pneumothorax. Skeletal structures are intact. IMPRESSION: No active cardiopulmonary disease. Electronically Signed   By: Lajean Manes M.D.   On: 01/30/2021 09:46   NM PET Image  Initial (PI) Skull Base To Thigh  Result Date: 01/26/2021 CLINICAL DATA:  Initial treatment strategy for mediastinal mass. EXAM: NUCLEAR MEDICINE PET SKULL BASE TO THIGH TECHNIQUE: 13.7 mCi F-18 FDG was injected intravenously. Full-ring PET imaging was performed from the skull base to thigh after the  radiotracer. CT data was obtained and used for attenuation correction and anatomic localization. Fasting blood glucose: 95 mg/dl COMPARISON:  01/07/2021 CTA chest FINDINGS: Mediastinal blood pool activity: SUV max 2.4 Liver activity: SUV max 3.2 NECK: No areas of abnormal hypermetabolism. Incidental CT findings: No cervical adenopathy. Bilateral carotid atherosclerosis. CHEST: A node at the right thoracic inlet measures 1.4 cm a S.U.V. max of 5.5 on 47/4. The anterior mediastinal mass is hypermetabolic. Example 5.9 x 4.7 cm and a S.U.V. max of 7.3 on 66/4. Small left axillary nodes are hypermetabolic. Example at a S.U.V. max of 3.9 on 61/4. Incidental CT findings: Small right and trace left pleural effusions, new. Aortic and coronary artery calcification. Mild cardiomegaly. ABDOMEN/PELVIS: The medial splenic lesion is hypermetabolic. 4.1 cm on prior contrast enhanced CT and a S.U.V. max of 13.9 on 106/4. An aortocaval 7 mm node demonstrates equivocal hypermetabolism at a S.U.V. max of 2.7 on 133/4. Incidental CT findings: Deferred to recent diagnostic CT. Normal adrenal glands. Abdominal aortic atherosclerosis. Subcentimeter right hepatic lobe cyst. Moderate prostatomegaly. SKELETON: No abnormal marrow activity. Incidental CT findings: none IMPRESSION: 1. The anterior mediastinal and medial splenic masses are both hypermetabolic, most consistent with lymphoma. ([place]) 5 2. Thoracic and equivocal abdominal nodal involvement as detailed above. 3. New small right and trace left pleural effusions since 01/07/2021. 4. Incidental findings, including: Coronary artery atherosclerosis. Aortic Atherosclerosis  (ICD10-I70.0). Prostatomegaly. Electronically Signed   By: Abigail Miyamoto M.D.   On: 01/26/2021 08:44    ASSESSMENT & PLAN:   70 year old male with with  #1 Newly diagnosed marginal zone lymphoma at least stage IIIA FDG lymphadenopathy on both sides of the diaphragm and splenic lesion.  #2 chest pain due to anterior mediastinal mass versus cardiopulmonary etiology.   #3 sleep apnea not optimally treated  #4  Patient Active Problem List   Diagnosis Date Noted   Nodular lymphocyte predominant Hodgkin lymphoma ([place]) 02/27/2021   Counseling regarding advance care planning and goals of care 02/27/2021   Visit for wound check 02/03/2021   Therapeutic opioid induced constipation 01/21/2021   Precordial chest pain 01/18/2021   Elevated coronary artery calcium score 01/18/2021   Preop examination 01/18/2021   Mediastinal mass 01/13/2021   Arthritis of carpometacarpal (CMC) joint of right thumb 11/25/2020   De Quervain's tenosynovitis, right 11/25/2020   Elevated blood pressure, situational 11/23/2020   Flexor tenosynovitis of thumb 11/23/2020   OSA (obstructive sleep apnea) 09/27/2019   SOB (shortness of breath) 07/06/2019   Routine general medical examination at a health care facility 12/27/2018   Intractable chronic migraine without aura and with status migrainosus 02/06/2018   Sensorineural hearing loss (SNHL) of both ears 12/01/2017   Vertigo 12/01/2017   Vestibular migraine 12/01/2017   Noise induced tinnitus of both ears 10/06/2017   Noise-induced hearing loss of both ears 10/06/2017   Pain in left knee 04/12/2017   Low testosterone in male 06/17/2016   Arthritis 06/01/2016   Obesity (BMI 35.0-39.9 without comorbidity) 06/01/2016    PLAN -I discussed the patient's available labs, CTA chest abdomen pelvis, PET/CT scan and echo in details. -We discussed the diagnosis of nodular lymphocyte predominant Hodgkin's lymphoma including its natural history, generally slow-moving nature,  treatment options and long-term prognosis. -Patient has at least stage IIIa disease and since other cardiac issues are being considered and evaluated , would hold off on using cardiotoxic medications at this time. -Discussed that he would have to have complete work-up with his cardiologist and pulmonology to evaluate the  etiology of his chest pain and shortness of breath. -I offered to give the patient a referral but he notes that he will follow-up with his primary care physician for the referrals. -We discussed option for weekly Rituxan to try to shrink the mass in his chest and elsewhere to buy some more time and see if that helps his symptoms.. . Orders Placed This Encounter  Procedures   CBC with Differential/Platelet    Standing Status:   Future    Number of Occurrences:   1    Standing Expiration Date:   02/23/2022   CMP ([facility] only)    Standing Status:   Future    Number of Occurrences:   1    Standing Expiration Date:   02/23/2022   Lactate dehydrogenase    Standing Status:   Future    Number of Occurrences:   1    Standing Expiration Date:   02/23/2022   Sedimentation rate    Standing Status:   Future    Number of Occurrences:   1    Standing Expiration Date:   02/23/2022   Hepatitis C antibody    Standing Status:   Future    Number of Occurrences:   1    Standing Expiration Date:   02/23/2022   Hepatitis B surface antigen    Standing Status:   Future    Number of Occurrences:   1    Standing Expiration Date:   02/23/2022   Hepatitis B core antibody, total    Standing Status:   Future    Number of Occurrences:   1    Standing Expiration Date:   02/23/2022    Follow-up -Labs today  -Chemo counseling for Rituxan -Please schedule to start Rituxan 1 week (weekly x4 doses) with labs and MD visit -Patient to follow-up with primary care physician for consideration of cardiology and pulmonary referrals.    All of the patients questions were answered with  apparent satisfaction. The patient knows to call the clinic with any problems, questions or concerns.  I spent 40 minutes counseling the patient face to face. The total time spent in the appointment was 60 minutes and more than 50% was on counseling and direct patient cares.    Sullivan Lone MD [place] AAHIVMS [facility] Hematology/Oncology Physician [facility]  (Office):       [phone] (Work cell):  [phone] (Fax):           [phone]  02/23/2021 9:51 AM

## 2021-02-23 NOTE — Telephone Encounter (Signed)
The note from his oncologist is not yet complete so I cannot see what they are recommending and I am not sure what he means by full cardiopulmonary workup. Can he clarify?

## 2021-02-23 NOTE — Progress Notes (Incomplete)
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 male who has been referred to [place] by Dr Pricilla Holm for evaluation and management of an anterior mediastinal mass after workup and chest CT.  Patient had undergone right supraclavicular node biopsy after findings of hypermetabolic lymph nodes and a large splenic mass that is hypermetabolic. ***  History taken on 02/17/2021 shows patient had chest pain and is taking Vicodin 4 times a day to manage the pain. He is also experiencing fatigue and shortness of breath.  Today, ***  MEDICAL HISTORY:  Past Medical History:  Diagnosis Date   Allergy    Arthritis    Colon polyps    Depression    Hearing impaired person, bilateral    Low testosterone    Migraines    OSA (obstructive sleep apnea) 09/27/2019   Vestibular migraine     SURGICAL HISTORY: Past Surgical History:  Procedure Laterality Date   BICEPS TENDON REPAIR Left    INNER EAR SURGERY Right    KNEE ARTHROSCOPY Left    ROTATOR CUFF REPAIR Bilateral    SUPRACLAVICAL NODE BIOPSY Right 01/30/2021   Procedure: EXCISION RIGHT SUPRACLAVICULAR LYMPH NODE;  Surgeon: [name], MD;  Location: [facility] OR;  Service: Thoracic;  Laterality: Right;   TYMPANOSTOMY TUBE PLACEMENT     WISDOM TOOTH EXTRACTION      SOCIAL HISTORY: Social History   Socioeconomic History   Marital status: Single    Spouse name: Not on file   Number of children: 2   Years of education: Not on file   Highest education level: Bachelor's degree (e.g., BA, AB, BS)  Occupational History   Occupation: retired  Tobacco Use   Smoking status:  Former    Types: Cigarettes    Quit date: 2002    Years since quitting: 20.9   Smokeless tobacco: Never  Vaping Use   Vaping Use: Never used  Substance and Sexual Activity   Alcohol use: Yes    Comment: occasionally   Drug use: No   Sexual activity: Yes    Partners: Female  Other Topics Concern   Not on file  Social History Narrative   Patient is right-handed. He lives in a single level home. He drinks 3 cups of coffee a day and rare tea or soda. He walks daily.       Education - college grad   Social Determinants of Radio broadcast assistant Strain: Not on file  Food Insecurity: Not on file  Transportation Needs: Not on file  Physical Activity: Not on file  Stress: Not on file  Social Connections: Not on file  Intimate Partner Violence: Not on file    FAMILY HISTORY: Family History  Problem Relation Age of Onset   Prostate cancer Other    Heart attack Mother 62   Heart failure Mother    Hypertension Mother    Dementia Father 61   Parkinsonism Father    Colon polyps Father    Lung cancer Sister    Melanoma Sister    Lung disease Sister    Hypertension Sister    Colon cancer Neg Hx    Stomach cancer Neg Hx  Rectal cancer Neg Hx    Esophageal cancer Neg Hx     ALLERGIES:  is allergic to peppermint oil, benzodiazepines, and other.  MEDICATIONS:  Current Outpatient Medications  Medication Sig Dispense Refill   Cholecalciferol (VITAMIN D3) 125 MCG (5000 UT) CAPS Take 5,000 Units by mouth every other day.     HYDROcodone-acetaminophen (VICODIN) 5-325 mg TABS tablet      Magnesium 500 MG TABS Take 500 mg by mouth daily.     Multiple Vitamins-Minerals (CENTRUM SILVER 50+MEN PO) Take 1 tablet by mouth daily with breakfast.     naproxen sodium (ALEVE) 220 MG tablet Take 220-440 mg by mouth 2 (two) times daily as needed (for headaches or migraines).     OnabotulinumtoxinA (BOTOX IJ) Inject as directed every 3 (three) months.     polyethylene glycol powder  (GLYCOLAX/MIRALAX) 17 GM/SCOOP powder Take 17 g by mouth 2 (two) times daily as needed. (Patient taking differently: Take 17 g by mouth 2 (two) times daily.) 3350 g 1   Polyvinyl Alcohol-Povidone PF (REFRESH) 1.4-0.6 % SOLN Place 1 drop into both eyes daily as needed (dry eye).     tamsulosin (FLOMAX) 0.4 MG CAPS capsule Take 1 capsule (0.4 mg total) by mouth daily. 90 capsule 3   testosterone cypionate (DEPOTESTOSTERONE CYPIONATE) 200 MG/ML injection Inject 200 mg into the muscle every 14 (fourteen) days.     triamcinolone cream (KENALOG) 0.1 % Apply 1 application topically daily as needed (Eczema).     venlafaxine XR (EFFEXOR XR) 37.5 MG 24 hr capsule Take 1 capsule daily with breakfast for one week, then increase to 2 capsules daily with breakfast. 60 capsule 0   Whey Protein POWD Take 1 Scoop by mouth See admin instructions. Mix 1 scoopful into preferred beverage and drink by mouth once a day     Zinc 50 MG TABS Take 50 mg by mouth daily.     No current facility-administered medications for this visit.    REVIEW OF SYSTEMS:    10 Point review of Systems was done is negative except as noted above.  PHYSICAL EXAMINATION: ECOG PERFORMANCE STATUS: {CHL ONC ECOG KD:[id number]}  .There were no vitals filed for this visit. There were no vitals filed for this visit. .There is no height or weight on file to calculate BMI.  GENERAL:alert, in no acute distress and comfortable SKIN: no acute rashes, no significant lesions EYES: conjunctiva are pink and non-injected, sclera anicteric OROPHARYNX: MMM, no exudates, no oropharyngeal erythema or ulceration NECK: supple, no JVD LYMPH:  no palpable lymphadenopathy in the cervical, axillary or inguinal regions LUNGS: clear to auscultation b/l with normal respiratory effort HEART: regular rate & rhythm ABDOMEN:  normoactive bowel sounds , non tender, not distended. Extremity: no pedal edema PSYCH: alert & oriented x 3 with fluent speech NEURO: no  focal motor/sensory deficits  LABORATORY DATA:  I have reviewed the data as listed  . CBC Latest Ref Rng & Units 01/29/2021 01/07/2021 12/27/2018  WBC 4.0 - 10.5 K/uL 11.1(H) 16.7(H) 7.7  Hemoglobin 13.0 - 17.0 g/dL 16.4 17.7(H) 15.0  Hematocrit 39.0 - 52.0 % 50.3 51.0 43.8  Platelets 150 - 400 K/uL 314 191 222.0    . CMP Latest Ref Rng & Units 01/29/2021 01/07/2021 12/27/2018  Glucose 70 - 99 mg/dL 106(H) 100(H) 95  BUN 8 - 23 mg/dL 19 23 24(H)  Creatinine 0.61 - 1.24 mg/dL 1.22 1.15 1.07  Sodium 135 - 145 mmol/L 136 135 138  Potassium 3.5 - 5.1 mmol/L  4.8 4.7 4.1  Chloride 98 - 111 mmol/L 103 103 104  CO2 22 - 32 mmol/L 26 25 25   Calcium 8.9 - 10.3 mg/dL 9.0 8.9 9.2  Total Protein 6.5 - 8.1 g/dL 6.7 - 7.1  Total Bilirubin 0.3 - 1.2 mg/dL 0.7 - 0.6  Alkaline Phos 38 - 126 U/L 141(H) - 72  AST 15 - 41 U/L 32 - 21  ALT 0 - 44 U/L 28 - 17     RADIOGRAPHIC STUDIES: I have personally reviewed the radiological images as listed and agreed with the findings in the report. DG Chest 2 View  Result Date: 01/30/2021 CLINICAL DATA:  Pre-admission. Patient for supraclavicular adenopathy biopsy. EXAM: CHEST - 2 VIEW COMPARISON:  01/07/2021 FINDINGS: Cardiac silhouette is normal in size. No mediastinal or hilar masses. No evidence of adenopathy. Linear opacity noted in the right middle lobe consistent with atelectasis. Lungs are otherwise clear. No pleural effusion or pneumothorax. Skeletal structures are intact. IMPRESSION: No active cardiopulmonary disease. Electronically Signed   By: Lajean Manes M.D.   On: 01/30/2021 09:46   NM PET Image Initial (PI) Skull Base To Thigh  Result Date: 01/26/2021 CLINICAL DATA:  Initial treatment strategy for mediastinal mass. EXAM: NUCLEAR MEDICINE PET SKULL BASE TO THIGH TECHNIQUE: 13.7 mCi F-18 FDG was injected intravenously. Full-ring PET imaging was performed from the skull base to thigh after the radiotracer. CT data was obtained and used for  attenuation correction and anatomic localization. Fasting blood glucose: 95 mg/dl COMPARISON:  01/07/2021 CTA chest FINDINGS: Mediastinal blood pool activity: SUV max 2.4 Liver activity: SUV max 3.2 NECK: No areas of abnormal hypermetabolism. Incidental CT findings: No cervical adenopathy. Bilateral carotid atherosclerosis. CHEST: A node at the right thoracic inlet measures 1.4 cm a S.U.V. max of 5.5 on 47/4. The anterior mediastinal mass is hypermetabolic. Example 5.9 x 4.7 cm and a S.U.V. max of 7.3 on 66/4. Small left axillary nodes are hypermetabolic. Example at a S.U.V. max of 3.9 on 61/4. Incidental CT findings: Small right and trace left pleural effusions, new. Aortic and coronary artery calcification. Mild cardiomegaly. ABDOMEN/PELVIS: The medial splenic lesion is hypermetabolic. 4.1 cm on prior contrast enhanced CT and a S.U.V. max of 13.9 on 106/4. An aortocaval 7 mm node demonstrates equivocal hypermetabolism at a S.U.V. max of 2.7 on 133/4. Incidental CT findings: Deferred to recent diagnostic CT. Normal adrenal glands. Abdominal aortic atherosclerosis. Subcentimeter right hepatic lobe cyst. Moderate prostatomegaly. SKELETON: No abnormal marrow activity. Incidental CT findings: none IMPRESSION: 1. The anterior mediastinal and medial splenic masses are both hypermetabolic, most consistent with lymphoma. ([place]) 5 2. Thoracic and equivocal abdominal nodal involvement as detailed above. 3. New small right and trace left pleural effusions since 01/07/2021. 4. Incidental findings, including: Coronary artery atherosclerosis. Aortic Atherosclerosis (ICD10-I70.0). Prostatomegaly. Electronically Signed   By: Abigail Miyamoto M.D.   On: 01/26/2021 08:44    ASSESSMENT & PLAN:  ***  All of the patients questions were answered with apparent satisfaction. The patient knows to call the clinic with any problems, questions or concerns.  I spent {CHL ONC TIME VISIT - YDXAJ:[id number]} counseling the patient face to  face. The total time spent in the appointment was {CHL ONC TIME VISIT - NOBSJ:[id number]} and more than 50% was on counseling and direct patient cares.   I,Zite Okoli,acting as a Education administrator for Sullivan Lone, MD.,have documented all relevant documentation on the behalf of Sullivan Lone, MD,as directed by  Sullivan Lone, MD while in the presence of Gautam Kale,  MD.   ***    Sullivan Lone MD MS AAHIVMS [facility] Hematology/Oncology Physician [facility]  (Office):       [phone] (Work cell):  [phone] (Fax):           [phone]  02/23/2021 9:52 AM

## 2021-02-23 NOTE — Patient Instructions (Signed)
Thank you for choosing [facility] to provide your care.   Should you have questions after your visit to the [facility] ([facility]), please contact this office at [phone] between 8:30 AM and 4:30 PM.  Voice mails left after 4:00 PM may not be returned until the following business day.  Calls received after 4:30 PM will be answered by an off-site Nurse Triage Line.    Prescription Refills:  Please have your pharmacy contact us directly for most prescription requests.  Contact the office directly for refills of narcotics (pain medications). Allow 48-72 hours for refills.  Appointments: Please contact the [facility] scheduling department [phone] for questions regarding [facility] appointment scheduling.  Contact the schedulers with any scheduling changes so that your appointment can be rescheduled in a timely manner.   Central Scheduling for [place] [phone] - Call to schedule procedures such as PET scans, CT scans, MRI, Ultrasound, etc.  To afford each patient quality time with our providers, please arrive 30 minutes before your scheduled appointment time.  If you arrive late for your appointment, you may be asked to reschedule.  We strive to give you quality time with our providers, and arriving late affects you and other patients whose appointments are after yours. If you are a no show for multiple scheduled visits, you may be dismissed from the clinic at the providers discretion.     Resources: [facility] Social Workers [phone] for additional information on assistance programs or assistance connecting with community support programs   [place] DSS  [phone]: Information regarding food stamps, Medicaid, and utility assistance [place] Access [place] [phone]   [place] [employer]'s shared-ride transportation service for eligible riders who have a disability that prevents them from riding the fixed route bus.   Medicare Rights Center 800-333-4114  Helps people with Medicare understand their rights and benefits, navigate the Medicare system, and secure the quality healthcare they deserve [employer] 800-227-2345 Assists patients locate various types of support and financial assistance Cancer Care: 1-800-813-HOPE (4673) Provides financial assistance, online support groups, medication/co-pay assistance.   Transportation Assistance for appointments at [facility]: Transportation Coordinator [phone]  Again, thank you for choosing [facility] for your care.       

## 2021-02-23 NOTE — Telephone Encounter (Signed)
See below

## 2021-02-23 NOTE — Telephone Encounter (Signed)
Patient had appointment today at cancer center. Has a tumor in his chest. Saying he needs a referal for a full cardiopulmonary work up to rule out other issues.

## 2021-02-24 ENCOUNTER — Encounter: Payer: Self-pay | Admitting: Internal Medicine

## 2021-02-24 DIAGNOSIS — R079 Chest pain, unspecified: Secondary | ICD-10-CM

## 2021-02-24 DIAGNOSIS — C8590 Non-Hodgkin lymphoma, unspecified, unspecified site: Secondary | ICD-10-CM

## 2021-02-24 DIAGNOSIS — R0602 Shortness of breath: Secondary | ICD-10-CM

## 2021-02-24 NOTE — Telephone Encounter (Signed)
See my chart message

## 2021-02-26 ENCOUNTER — Other Ambulatory Visit: Payer: Self-pay

## 2021-02-27 ENCOUNTER — Other Ambulatory Visit: Payer: Self-pay

## 2021-02-27 ENCOUNTER — Inpatient Hospital Stay: Payer: Medicare Other

## 2021-02-27 ENCOUNTER — Inpatient Hospital Stay: Payer: Medicare Other | Admitting: Hematology

## 2021-02-27 ENCOUNTER — Encounter: Payer: Self-pay | Admitting: Hematology

## 2021-02-27 ENCOUNTER — Inpatient Hospital Stay: Payer: Medicare Other | Attending: Hematology

## 2021-02-27 DIAGNOSIS — Z7189 Other specified counseling: Secondary | ICD-10-CM | POA: Insufficient documentation

## 2021-02-27 DIAGNOSIS — C8100 Nodular lymphocyte predominant Hodgkin lymphoma, unspecified site: Secondary | ICD-10-CM | POA: Insufficient documentation

## 2021-02-27 MED ORDER — ONDANSETRON HCL 8 MG PO TABS
8.0000 mg | ORAL_TABLET | Freq: Three times a day (TID) | ORAL | 0 refills | Status: DC | PRN
Start: 2021-02-27 — End: 2021-12-28

## 2021-02-27 NOTE — Progress Notes (Signed)
START OFF PATHWAY REGIMEN - Lymphoma and CLL   [id number]:Rituximab IV/SUBQ D1 q7 Days:   Cycle 1: A cycle is 7 days:     Rituximab-xxxx    Cycles 2 and beyond: A cycle is every 7 days:     Rituximab and hyaluronidase human   **Always confirm dose/schedule in your pharmacy ordering system**  Patient Characteristics: Nodular Lymphocyte Predominant Hodgkin Lymphoma, First Line, Stage III / IV Disease Type: Not Applicable Disease Type: Not Applicable Disease Type: Nodular Lymphocyte Predominant Hodgkin Lymphoma Line of therapy: First Line Intent of Therapy: Non-Curative / Palliative Intent, Discussed with Patient

## 2021-03-02 ENCOUNTER — Encounter: Payer: Self-pay | Admitting: Hematology

## 2021-03-04 ENCOUNTER — Ambulatory Visit: Payer: Medicare Other

## 2021-03-05 NOTE — Progress Notes (Signed)
Pharmacist Chemotherapy Monitoring - Initial Assessment    Anticipated start date: 03/12/21   The following has been reviewed per standard work regarding the patient's treatment regimen: The patient's diagnosis, treatment plan and drug doses, and organ/hematologic function Lab orders and baseline tests specific to treatment regimen  The treatment plan start date, drug sequencing, and pre-medications Prior authorization status  Patient's documented medication list, including drug-drug interaction screen and prescriptions for anti-emetics and supportive care specific to the treatment regimen The drug concentrations, fluid compatibility, administration routes, and timing of the medications to be used The patient's access for treatment and lifetime cumulative dose history, if applicable  The patient's medication allergies and previous infusion related reactions, if applicable   Changes made to treatment plan:  treatment plan date  Follow up needed:  N/A    Kennith Center, Pharm.D., CPP 03/05/2021@12 :54 PM

## 2021-03-11 ENCOUNTER — Other Ambulatory Visit: Payer: Medicare Other

## 2021-03-11 ENCOUNTER — Ambulatory Visit: Payer: Medicare Other

## 2021-03-11 ENCOUNTER — Ambulatory Visit: Payer: Medicare Other | Admitting: Hematology

## 2021-03-11 ENCOUNTER — Other Ambulatory Visit: Payer: Self-pay

## 2021-03-11 DIAGNOSIS — C8100 Nodular lymphocyte predominant Hodgkin lymphoma, unspecified site: Secondary | ICD-10-CM | POA: Diagnosis not present

## 2021-03-11 DIAGNOSIS — C8110 Nodular sclerosis classical Hodgkin lymphoma, unspecified site: Secondary | ICD-10-CM

## 2021-03-11 DIAGNOSIS — C8108 Nodular lymphocyte predominant Hodgkin lymphoma, lymph nodes of multiple sites: Secondary | ICD-10-CM | POA: Diagnosis not present

## 2021-03-12 ENCOUNTER — Inpatient Hospital Stay: Payer: Medicare Other

## 2021-03-12 ENCOUNTER — Inpatient Hospital Stay: Payer: Medicare Other | Admitting: Hematology

## 2021-03-12 ENCOUNTER — Encounter: Payer: Self-pay | Admitting: Internal Medicine

## 2021-03-12 ENCOUNTER — Other Ambulatory Visit: Payer: Self-pay | Admitting: Hematology

## 2021-03-13 ENCOUNTER — Ambulatory Visit: Payer: Medicare Other | Admitting: Neurology

## 2021-03-13 NOTE — Progress Notes (Signed)
Pt contacted this office to let Dr Irene Limbo know that he has decided to receive treatment at [facility] instead of with [facility]. Dr Irene Limbo informed. Upcoming appointments canceled.

## 2021-03-17 ENCOUNTER — Other Ambulatory Visit (HOSPITAL_BASED_OUTPATIENT_CLINIC_OR_DEPARTMENT_OTHER): Payer: Self-pay

## 2021-03-17 ENCOUNTER — Ambulatory Visit: Payer: Medicare Other

## 2021-03-17 ENCOUNTER — Inpatient Hospital Stay: Payer: Medicare Other

## 2021-03-17 ENCOUNTER — Other Ambulatory Visit: Payer: Medicare Other

## 2021-03-17 ENCOUNTER — Encounter: Payer: Self-pay | Admitting: Hematology

## 2021-03-17 ENCOUNTER — Inpatient Hospital Stay: Payer: Medicare Other | Admitting: Physician Assistant

## 2021-03-17 ENCOUNTER — Ambulatory Visit: Payer: Medicare Other | Attending: Internal Medicine

## 2021-03-17 DIAGNOSIS — Z23 Encounter for immunization: Secondary | ICD-10-CM

## 2021-03-17 MED ORDER — INFLUENZA VAC SPLIT QUAD 0.5 ML IM SUSY
PREFILLED_SYRINGE | INTRAMUSCULAR | 0 refills | Status: DC
  Filled 2021-03-17: qty 0.5, 1d supply, fill #0

## 2021-03-17 MED ORDER — PFIZER COVID-19 VAC BIVALENT 30 MCG/0.3ML IM SUSP
INTRAMUSCULAR | 0 refills | Status: DC
  Filled 2021-03-17: qty 0.3, 1d supply, fill #0

## 2021-03-17 NOTE — Progress Notes (Signed)
° °  Covid-19 Vaccination Clinic  Name:  Michael Conway    MRN: [id number] DOB: [birth year]  03/17/2021  [name] was observed post Covid-19 immunization for 30 minutes based on pre-vaccination screening without incident. He was provided with Vaccine Information Sheet and instruction to access the V-Safe system.   [name] was instructed to call 911 with any severe reactions post vaccine: Difficulty breathing  Swelling of face and throat  A fast heartbeat  A bad rash all over body  Dizziness and weakness   Immunizations Administered     Name Date Dose VIS Date Route   Pfizer Covid-19 Vaccine Bivalent Booster 03/17/2021 10:50 AM 0.3 mL 11/26/2020 Intramuscular   Manufacturer: [place]   Lot: [id number]   [place]: [phone]

## 2021-03-18 ENCOUNTER — Inpatient Hospital Stay: Payer: Medicare Other

## 2021-03-18 ENCOUNTER — Other Ambulatory Visit (HOSPITAL_BASED_OUTPATIENT_CLINIC_OR_DEPARTMENT_OTHER): Payer: Self-pay

## 2021-03-24 DIAGNOSIS — C8590 Non-Hodgkin lymphoma, unspecified, unspecified site: Secondary | ICD-10-CM | POA: Diagnosis not present

## 2021-03-24 DIAGNOSIS — Z452 Encounter for adjustment and management of vascular access device: Secondary | ICD-10-CM | POA: Diagnosis not present

## 2021-03-25 ENCOUNTER — Inpatient Hospital Stay: Payer: Medicare Other

## 2021-03-27 ENCOUNTER — Other Ambulatory Visit (HOSPITAL_BASED_OUTPATIENT_CLINIC_OR_DEPARTMENT_OTHER): Payer: Self-pay

## 2021-03-27 DIAGNOSIS — Z5112 Encounter for antineoplastic immunotherapy: Secondary | ICD-10-CM | POA: Diagnosis not present

## 2021-03-27 DIAGNOSIS — C8108 Nodular lymphocyte predominant Hodgkin lymphoma, lymph nodes of multiple sites: Secondary | ICD-10-CM | POA: Diagnosis not present

## 2021-03-27 DIAGNOSIS — Z5111 Encounter for antineoplastic chemotherapy: Secondary | ICD-10-CM | POA: Diagnosis not present

## 2021-03-27 DIAGNOSIS — Z7689 Persons encountering health services in other specified circumstances: Secondary | ICD-10-CM | POA: Diagnosis not present

## 2021-03-29 DIAGNOSIS — D4989 Neoplasm of unspecified behavior of other specified sites: Secondary | ICD-10-CM

## 2021-03-29 DIAGNOSIS — C8190 Hodgkin lymphoma, unspecified, unspecified site: Secondary | ICD-10-CM

## 2021-03-29 HISTORY — DX: Neoplasm of unspecified behavior of other specified sites: D49.89

## 2021-03-29 HISTORY — PX: OTHER SURGICAL HISTORY: SHX169

## 2021-03-29 HISTORY — DX: Hodgkin lymphoma, unspecified, unspecified site: C81.90

## 2021-04-01 ENCOUNTER — Inpatient Hospital Stay: Payer: Medicare Other

## 2021-04-10 ENCOUNTER — Ambulatory Visit: Payer: Medicare Other | Admitting: Adult Health

## 2021-04-17 DIAGNOSIS — C8108 Nodular lymphocyte predominant Hodgkin lymphoma, lymph nodes of multiple sites: Secondary | ICD-10-CM | POA: Diagnosis not present

## 2021-04-17 DIAGNOSIS — Z5111 Encounter for antineoplastic chemotherapy: Secondary | ICD-10-CM | POA: Diagnosis not present

## 2021-04-17 DIAGNOSIS — Z79899 Other long term (current) drug therapy: Secondary | ICD-10-CM | POA: Diagnosis not present

## 2021-04-30 ENCOUNTER — Telehealth: Payer: Self-pay

## 2021-04-30 NOTE — Telephone Encounter (Signed)
New message  Benefit Verification [id number] Submitted! For BV Basic submissions, please allow 2 business days for results.  For BV Full submissions, please allow 4 business days for results.

## 2021-05-01 NOTE — Telephone Encounter (Signed)
F/u  BOTOX (onabotulinumtoxinA)  ACQUISITION - [place] List of Specialty Pharmacies may not include all available options. If preferred Specialty Pharmacy is not listed, check with payer.  [?]Buy and Bill [ ]  Buy and [place] Available [ ]  Specialty Pharmacy Required [ ]  Prescription Coverage Only - See Pharmacy Benefits Section [ ]  Payer will not release information Specialty Pharmacies:     COVERAGE DETAILS - MAJOR MEDICAL BENEFITS  The plan's renewal date is 03/29/2022. This plan runs on a calendar year basis. This plan is a supplement to Medicare Part B and will cover the remaining co-insurance only. The patient is responsible for the Medicare Part B deductible. For additional payer coverage criteria, please reference the BOTOX Policies and Forms link available at [email or web address].

## 2021-05-05 ENCOUNTER — Other Ambulatory Visit: Payer: Self-pay | Admitting: Hematology

## 2021-05-07 DIAGNOSIS — C8108 Nodular lymphocyte predominant Hodgkin lymphoma, lymph nodes of multiple sites: Secondary | ICD-10-CM | POA: Diagnosis not present

## 2021-05-08 DIAGNOSIS — Z5111 Encounter for antineoplastic chemotherapy: Secondary | ICD-10-CM | POA: Diagnosis not present

## 2021-05-08 DIAGNOSIS — C8108 Nodular lymphocyte predominant Hodgkin lymphoma, lymph nodes of multiple sites: Secondary | ICD-10-CM | POA: Diagnosis not present

## 2021-05-08 DIAGNOSIS — Z5112 Encounter for antineoplastic immunotherapy: Secondary | ICD-10-CM | POA: Diagnosis not present

## 2021-05-15 DIAGNOSIS — L538 Other specified erythematous conditions: Secondary | ICD-10-CM | POA: Diagnosis not present

## 2021-05-15 DIAGNOSIS — R208 Other disturbances of skin sensation: Secondary | ICD-10-CM | POA: Diagnosis not present

## 2021-05-15 DIAGNOSIS — L820 Inflamed seborrheic keratosis: Secondary | ICD-10-CM | POA: Diagnosis not present

## 2021-05-21 ENCOUNTER — Encounter: Payer: Self-pay | Admitting: Neurology

## 2021-05-29 DIAGNOSIS — C8108 Nodular lymphocyte predominant Hodgkin lymphoma, lymph nodes of multiple sites: Secondary | ICD-10-CM | POA: Diagnosis not present

## 2021-05-29 DIAGNOSIS — R000 Tachycardia, unspecified: Secondary | ICD-10-CM | POA: Diagnosis not present

## 2021-05-29 DIAGNOSIS — Z5111 Encounter for antineoplastic chemotherapy: Secondary | ICD-10-CM | POA: Diagnosis not present

## 2021-05-31 DIAGNOSIS — C8108 Nodular lymphocyte predominant Hodgkin lymphoma, lymph nodes of multiple sites: Secondary | ICD-10-CM | POA: Diagnosis not present

## 2021-06-11 ENCOUNTER — Encounter: Payer: Self-pay | Admitting: Internal Medicine

## 2021-06-12 ENCOUNTER — Other Ambulatory Visit: Payer: Self-pay

## 2021-06-12 ENCOUNTER — Ambulatory Visit (INDEPENDENT_AMBULATORY_CARE_PROVIDER_SITE_OTHER): Payer: Medicare Other | Admitting: Neurology

## 2021-06-12 DIAGNOSIS — G43709 Chronic migraine without aura, not intractable, without status migrainosus: Secondary | ICD-10-CM | POA: Diagnosis not present

## 2021-06-12 MED ORDER — ONABOTULINUMTOXINA 100 UNITS IJ SOLR
167.0000 [IU] | Freq: Once | INTRAMUSCULAR | Status: AC
  Administered 2021-06-12: 167 [IU] via INTRAMUSCULAR

## 2021-06-12 NOTE — Progress Notes (Signed)
Botulinum Clinic  ? ?Procedure Note Botox ? ?Attending: Dr. Metta Clines ? ?Preoperative Diagnosis(es): Chronic migraine ? ?Consent obtained from: The patient ?Benefits discussed included, but were not limited to decreased muscle tightness, increased joint range of motion, and decreased pain.  Risk discussed included, but were not limited pain and discomfort, bleeding, bruising, excessive weakness, venous thrombosis, muscle atrophy and dysphagia.  Anticipated outcomes of the procedure as well as he risks and benefits of the alternatives to the procedure, and the roles and tasks of the personnel to be involved, were discussed with the patient, and the patient consents to the procedure and agrees to proceed. A copy of the patient medication guide was given to the patient which explains the blackbox warning. ? ?Patients identity and treatment sites confirmed Yes.  . ? ?Details of Procedure: ?Skin was cleaned with alcohol. Prior to injection, the needle plunger was aspirated to make sure the needle was not within a blood vessel.  There was no blood retrieved on aspiration.   ? ?Following is a summary of the muscles injected  And the amount of Botulinum toxin used: ? ?Dilution ?200 units of Botox was reconstituted with 4 ml of preservative free normal saline. ?Time of reconstitution: At the time of the office visit (<30 minutes prior to injection)  ? ?Injections  ?155 total units of Botox was injected with a 30 gauge needle. ? ?Injection Sites: ?L occipitalis: 15 units- 3 sites  ?R occiptalis: 15 units- 3 sites ? ?L upper trapezius: 15 units- 3 sites ?R upper trapezius: 15 units- 3 sits          ?L paraspinal: 10 units- 2 sites ?R paraspinal: 10 units- 2 sites ? ?Face ?L frontalis(2 injection sites):10 units   ?R frontalis(2 injection sites):10 units         ?L corrugator: 5 units   ?R corrugator: 5 units           ?Procerus: 5 units   ?L temporalis: 20 units ?R temporalis: 20 units  ? ?Agent:  ?200 units of botulinum Type  A (Onobotulinum Toxin type A) was reconstituted with 4 ml of preservative free normal saline.  ?Time of reconstitution: At the time of the office visit (<30 minutes prior to injection)  ? ? ? Total injected (Units):  155 ? Total wasted (Units):  12 ? ?Patient tolerated procedure well without complications.   ?Reinjection is anticipated in 3 months. ? ?

## 2021-06-16 ENCOUNTER — Ambulatory Visit (INDEPENDENT_AMBULATORY_CARE_PROVIDER_SITE_OTHER): Payer: Medicare Other

## 2021-06-16 VITALS — Ht 72.0 in | Wt 266.0 lb

## 2021-06-16 DIAGNOSIS — Z0000 Encounter for general adult medical examination without abnormal findings: Secondary | ICD-10-CM

## 2021-06-16 NOTE — Progress Notes (Signed)
?I connected with Michael Conway today by telephone and verified that I am speaking with the correct person using two identifiers. ?Location patient: home ?Location provider: work ?Persons participating in the virtual visit: patient, provider. ?  ?I discussed the limitations, risks, security and privacy concerns of performing an evaluation and management service by telephone and the availability of in person appointments. I also discussed with the patient that there may be a patient responsible charge related to this service. The patient expressed understanding and verbally consented to this telephonic visit.  ?  ?Interactive audio and video telecommunications were attempted between this provider and patient, however failed, due to patient having technical difficulties OR patient did not have access to video capability.  We continued and completed visit with audio only. ? ?Some vital signs may be absent or patient reported.  ? ?Time Spent with patient on telephone encounter: 40 minutes ? ?Subjective:  ? Michael Conway is a 71 y.o. male who presents for Medicare Annual/Subsequent preventive examination. ? ?Review of Systems    ? ?Cardiac Risk Factors include: advanced age (>72mn, >>77women);family history of premature cardiovascular disease;male gender ? ?   ?Objective:  ?  ?Today's Vitals  ? 06/16/21 0842  ?Weight: 266 lb (120.7 kg)  ?Height: 6' (1.829 m)  ?PainSc: 7   ? ?Body mass index is 36.08 kg/m?. ? ?Advanced Directives 06/16/2021 01/29/2021 01/07/2021 12/19/2020 10/26/2019 09/26/2019 05/15/2019  ?Does Patient Have a Medical Advance Directive? No Yes No (No Data) No No Yes  ?Type of Advance Directive - [place] - - - - -  ?Copy of [place]? - No - copy requested - - - - -  ?Would patient like information on creating a medical advance directive? Yes (MAU/Ambulatory/Procedural Areas - Information given) - No - Patient declined - - No - Patient declined  -  ? ? ?Current Medications (verified) ?Outpatient Encounter Medications as of 06/16/2021  ?Medication Sig  ? Cholecalciferol (VITAMIN D3) 125 MCG (5000 UT) CAPS Take 5,000 Units by mouth every other day.  ? COVID-19 mRNA bivalent vaccine, Pfizer, (PFIZER COVID-19 VAC BIVALENT) injection Inject into the muscle.  ? HYDROcodone-acetaminophen (VICODIN) 5-325 mg TABS tablet   ? influenza vac split quadrivalent PF (FLUARIX) 0.5 ML injection Inject into the muscle.  ? Magnesium 500 MG TABS Take 500 mg by mouth daily.  ? Multiple Vitamins-Minerals (CENTRUM SILVER 50+MEN PO) Take 1 tablet by mouth daily with breakfast.  ? naproxen sodium (ALEVE) 220 MG tablet Take 220-440 mg by mouth 2 (two) times daily as needed (for headaches or migraines).  ? OnabotulinumtoxinA (BOTOX IJ) Inject as directed every 3 (three) months.  ? ondansetron (ZOFRAN) 8 MG tablet Take 1 tablet (8 mg total) by mouth every 8 (eight) hours as needed for nausea or vomiting.  ? polyethylene glycol powder (GLYCOLAX/MIRALAX) 17 GM/SCOOP powder Take 17 g by mouth 2 (two) times daily as needed. (Patient taking differently: Take 17 g by mouth 2 (two) times daily.)  ? Polyvinyl Alcohol-Povidone PF (REFRESH) 1.4-0.6 % SOLN Place 1 drop into both eyes daily as needed (dry eye).  ? senna-docusate (SENNA S) 8.6-50 MG tablet Take 2 tablets by mouth at bedtime.  ? tamsulosin (FLOMAX) 0.4 MG CAPS capsule Take 1 capsule (0.4 mg total) by mouth daily.  ? testosterone cypionate (DEPOTESTOSTERONE CYPIONATE) 200 MG/ML injection Inject 200 mg into the muscle every 14 (fourteen) days.  ? triamcinolone cream (KENALOG) 0.1 % Apply 1 application topically daily as  needed (Eczema).  ? venlafaxine XR (EFFEXOR XR) 37.5 MG 24 hr capsule Take 1 capsule daily with breakfast for one week, then increase to 2 capsules daily with breakfast.  ? Whey Protein POWD Take 1 Scoop by mouth See admin instructions. Mix 1 scoopful into preferred beverage and drink by mouth once a day  ? Zinc 50 MG  TABS Take 50 mg by mouth daily.  ? ?No facility-administered encounter medications on file as of 06/16/2021.  ? ? ?Allergies (verified) ?Peppermint oil, Benzodiazepines, and Other  ? ?History: ?Past Medical History:  ?Diagnosis Date  ? Allergy   ? Arthritis   ? Colon polyps   ? Depression   ? Hearing impaired person, bilateral   ? Low testosterone   ? Migraines   ? OSA (obstructive sleep apnea) 09/27/2019  ? Vestibular migraine   ? ?Past Surgical History:  ?Procedure Laterality Date  ? BICEPS TENDON REPAIR Left   ? INNER EAR SURGERY Right   ? KNEE ARTHROSCOPY Left   ? ROTATOR CUFF REPAIR Bilateral   ? SUPRACLAVICAL NODE BIOPSY Right 01/30/2021  ? Procedure: EXCISION RIGHT SUPRACLAVICULAR LYMPH NODE;  Surgeon: [name], MD;  Location: [facility];  Service: Thoracic;  Laterality: Right;  ? TYMPANOSTOMY TUBE PLACEMENT    ? WISDOM TOOTH EXTRACTION    ? ?Family History  ?Problem Relation Age of Onset  ? Prostate cancer Other   ? Heart attack Mother 9  ? Heart failure Mother   ? Hypertension Mother   ? Dementia Father 58  ? Parkinsonism Father   ? Colon polyps Father   ? Lung cancer Sister   ? Melanoma Sister   ? Lung disease Sister   ? Hypertension Sister   ? Colon cancer Neg Hx   ? Stomach cancer Neg Hx   ? Rectal cancer Neg Hx   ? Esophageal cancer Neg Hx   ? ?Social History  ? ?Socioeconomic History  ? Marital status: Single  ?  Spouse name: Not on file  ? Number of children: 2  ? Years of education: Not on file  ? Highest education level: Bachelor's degree (e.g., BA, AB, BS)  ?Occupational History  ? Occupation: retired  ?Tobacco Use  ? Smoking status: Former  ?  Types: Cigarettes  ?  Quit date: 2002  ?  Years since quitting: 21.2  ? Smokeless tobacco: Never  ?Vaping Use  ? Vaping Use: Never used  ?Substance and Sexual Activity  ? Alcohol use: Yes  ?  Comment: occasionally  ? Drug use: No  ? Sexual activity: Yes  ?  Partners: Female  ?Other Topics Concern  ? Not on file  ?Social History Narrative  ? Patient is  right-handed. He lives in a single level home. He drinks 3 cups of coffee a day and rare tea or soda. He walks daily.   ?   ? Education - college grad  ? ?Social Determinants of Health  ? ?Financial Resource Strain: Low Risk   ? Difficulty of Paying Living Expenses: Not hard at all  ?Food Insecurity: No Food Insecurity  ? Worried About Charity fundraiser in the Last Year: Never true  ? Ran Out of Food in the Last Year: Never true  ?Transportation Needs: No Transportation Needs  ? Lack of Transportation (Medical): No  ? Lack of Transportation (Non-Medical): No  ?Physical Activity: Inactive  ? Days of Exercise per Week: 0 days  ? Minutes of Exercise per Session: 0 min  ?Stress:  No Stress Concern Present  ? Feeling of Stress : Not at all  ?Social Connections: Unknown  ? Frequency of Communication with Friends and Family: Patient refused  ? Frequency of Social Gatherings with Friends and Family: Patient refused  ? Attends Religious Services: Patient refused  ? Active Member of Clubs or Organizations: Patient refused  ? Attends Archivist Meetings: Patient refused  ? Marital Status: Living with partner  ? ? ?Tobacco Counseling ?Counseling given: Not Answered ? ? ?Clinical Intake: ? ?Pre-visit preparation completed: Yes ? ?Pain : 0-10 ?Pain Score: 7  ?Pain Type: Chronic pain ?Pain Location: Other (Comment) (all over the body) ?Pain Relieving Factors: Pain Medication: Oxycodone ?Effect of Pain on Daily Activities: Pain can diminish job performance, lower motivation to exercise, and prevent you from completing daily tasks. Pain produces disability and affects the quality of life. ? ?Pain Relieving Factors: Pain Medication: Oxycodone ? ?Diabetes: No ? ?How often do you need to have someone help you when you read instructions, pamphlets, or other written materials from your doctor or pharmacy?: 1 - Never ?What is the last grade level you completed in school?: Advanced College Degree; some Master degree  courses ? ?Diabetic? no ? ?Interpreter Needed?: No ? ?Information entered by :: Lisette Abu, LPN ? ? ?Activities of Daily Living ?In your present state of health, do you have any difficulty performing the f

## 2021-06-16 NOTE — Patient Instructions (Signed)
Michael Conway , ?Thank you for taking time to come for your Medicare Wellness Visit. I appreciate your ongoing commitment to your health goals. Please review the following plan we discussed and let me know if I can assist you in the future.  ? ?Screening recommendations/referrals: ?Colonoscopy: 09/15/2020; due every 3 years ?Recommended yearly ophthalmology/optometry visit for glaucoma screening and checkup ?Recommended yearly dental visit for hygiene and checkup ? ?Vaccinations: ?Influenza vaccine: 03/17/2021 ?Pneumococcal vaccine: declined ?Tdap vaccine: 01/27/2018; due every 10 years ?Shingles vaccine: declined   ?Covid-19: 05/12/2019, 06/02/2019, 11/28/2019, 03/17/2021 ? ?Advanced directives: Advance directive discussed with you today. I have provided a copy for you to complete at home and have notarized. Once this is complete please bring a copy in to our office so we can scan it into your chart. ? ?Conditions/risks identified: Yes; Client understands the importance of follow-up appointments with providers by attending scheduled visits. ? ?Next appointment: 06/18/2022 at 8:40 a.m. telephone visit with Mignon Pine, Nurse Health Advisor.  If need to reschedule or cancel please call [phone]. ? ?Preventive Care 73 Years and Older, Male ?Preventive care refers to lifestyle choices and visits with your health care provider that can promote health and wellness. ?What does preventive care include? ?A yearly physical exam. This is also called an annual well check. ?Dental exams once or twice a year. ?Routine eye exams. Ask your health care provider how often you should have your eyes checked. ?Personal lifestyle choices, including: ?Daily care of your teeth and gums. ?Regular physical activity. ?Eating a healthy diet. ?Avoiding tobacco and drug use. ?Limiting alcohol use. ?Practicing safe sex. ?Taking low doses of aspirin every day. ?Taking vitamin and mineral supplements as recommended by your health care provider. ?What  happens during an annual well check? ?The services and screenings done by your health care provider during your annual well check will depend on your age, overall health, lifestyle risk factors, and family history of disease. ?Counseling  ?Your health care provider may ask you questions about your: ?Alcohol use. ?Tobacco use. ?Drug use. ?Emotional well-being. ?Home and relationship well-being. ?Sexual activity. ?Eating habits. ?History of falls. ?Memory and ability to understand (cognition). ?Work and work Statistician. ?Screening  ?You may have the following tests or measurements: ?Height, weight, and BMI. ?Blood pressure. ?Lipid and cholesterol levels. These may be checked every 5 years, or more frequently if you are over 79 years old. ?Skin check. ?Lung cancer screening. You may have this screening every year starting at age 85 if you have a 30-pack-year history of smoking and currently smoke or have quit within the past 15 years. ?Fecal occult blood test (FOBT) of the stool. You may have this test every year starting at age 57. ?Flexible sigmoidoscopy or colonoscopy. You may have a sigmoidoscopy every 5 years or a colonoscopy every 10 years starting at age 66. ?Prostate cancer screening. Recommendations will vary depending on your family history and other risks. ?Hepatitis C blood test. ?Hepatitis B blood test. ?Sexually transmitted disease (STD) testing. ?Diabetes screening. This is done by checking your blood sugar (glucose) after you have not eaten for a while (fasting). You may have this done every 1-3 years. ?Abdominal aortic aneurysm (AAA) screening. You may need this if you are a current or former smoker. ?Osteoporosis. You may be screened starting at age 35 if you are at high risk. ?Talk with your health care provider about your test results, treatment options, and if necessary, the need for more tests. ?Vaccines  ?Your health care provider may  recommend certain vaccines, such as: ?Influenza vaccine. This  is recommended every year. ?Tetanus, diphtheria, and acellular pertussis (Tdap, Td) vaccine. You may need a Td booster every 10 years. ?Zoster vaccine. You may need this after age 48. ?Pneumococcal 13-valent conjugate (PCV13) vaccine. One dose is recommended after age 15. ?Pneumococcal polysaccharide (PPSV23) vaccine. One dose is recommended after age 23. ?Talk to your health care provider about which screenings and vaccines you need and how often you need them. ?This information is not intended to replace advice given to you by your health care provider. Make sure you discuss any questions you have with your health care provider. ?Document Released: 04/11/2015 Document Revised: 12/03/2015 Document Reviewed: 01/14/2015 ?Elsevier Interactive Patient Education ? 2017 [employer]. ? ?Fall Prevention in the Home ?Falls can cause injuries. They can happen to people of all ages. There are many things you can do to make your home safe and to help prevent falls. ?What can I do on the outside of my home? ?Regularly fix the edges of walkways and driveways and fix any cracks. ?Remove anything that might make you trip as you walk through a door, such as a raised step or threshold. ?Trim any bushes or trees on the path to your home. ?Use bright outdoor lighting. ?Clear any walking paths of anything that might make someone trip, such as rocks or tools. ?Regularly check to see if handrails are loose or broken. Make sure that both sides of any steps have handrails. ?Any raised decks and porches should have guardrails on the edges. ?Have any leaves, snow, or ice cleared regularly. ?Use sand or salt on walking paths during winter. ?Clean up any spills in your garage right away. This includes oil or grease spills. ?What can I do in the bathroom? ?Use night lights. ?Install grab bars by the toilet and in the tub and shower. Do not use towel bars as grab bars. ?Use non-skid mats or decals in the tub or shower. ?If you need to sit down  in the shower, use a plastic, non-slip stool. ?Keep the floor dry. Clean up any water that spills on the floor as soon as it happens. ?Remove soap buildup in the tub or shower regularly. ?Attach bath mats securely with double-sided non-slip rug tape. ?Do not have throw rugs and other things on the floor that can make you trip. ?What can I do in the bedroom? ?Use night lights. ?Make sure that you have a light by your bed that is easy to reach. ?Do not use any sheets or blankets that are too big for your bed. They should not hang down onto the floor. ?Have a firm chair that has side arms. You can use this for support while you get dressed. ?Do not have throw rugs and other things on the floor that can make you trip. ?What can I do in the kitchen? ?Clean up any spills right away. ?Avoid walking on wet floors. ?Keep items that you use a lot in easy-to-reach places. ?If you need to reach something above you, use a strong step stool that has a grab bar. ?Keep electrical cords out of the way. ?Do not use floor polish or wax that makes floors slippery. If you must use wax, use non-skid floor wax. ?Do not have throw rugs and other things on the floor that can make you trip. ?What can I do with my stairs? ?Do not leave any items on the stairs. ?Make sure that there are handrails on both sides of  the stairs and use them. Fix handrails that are broken or loose. Make sure that handrails are as long as the stairways. ?Check any carpeting to make sure that it is firmly attached to the stairs. Fix any carpet that is loose or worn. ?Avoid having throw rugs at the top or bottom of the stairs. If you do have throw rugs, attach them to the floor with carpet tape. ?Make sure that you have a light switch at the top of the stairs and the bottom of the stairs. If you do not have them, ask someone to add them for you. ?What else can I do to help prevent falls? ?Wear shoes that: ?Do not have high heels. ?Have rubber bottoms. ?Are comfortable  and fit you well. ?Are closed at the toe. Do not wear sandals. ?If you use a stepladder: ?Make sure that it is fully opened. Do not climb a closed stepladder. ?Make sure that both sides of the stepladder

## 2021-06-19 DIAGNOSIS — R079 Chest pain, unspecified: Secondary | ICD-10-CM | POA: Diagnosis not present

## 2021-06-19 DIAGNOSIS — C8108 Nodular lymphocyte predominant Hodgkin lymphoma, lymph nodes of multiple sites: Secondary | ICD-10-CM | POA: Diagnosis not present

## 2021-06-19 DIAGNOSIS — M542 Cervicalgia: Secondary | ICD-10-CM | POA: Diagnosis not present

## 2021-06-19 DIAGNOSIS — Z5111 Encounter for antineoplastic chemotherapy: Secondary | ICD-10-CM | POA: Diagnosis not present

## 2021-06-24 ENCOUNTER — Encounter: Payer: Medicare Other | Admitting: Psychology

## 2021-07-01 ENCOUNTER — Encounter: Payer: Medicare Other | Admitting: Psychology

## 2021-07-10 DIAGNOSIS — Z5111 Encounter for antineoplastic chemotherapy: Secondary | ICD-10-CM | POA: Diagnosis not present

## 2021-07-10 DIAGNOSIS — R161 Splenomegaly, not elsewhere classified: Secondary | ICD-10-CM | POA: Diagnosis not present

## 2021-07-10 DIAGNOSIS — J9859 Other diseases of mediastinum, not elsewhere classified: Secondary | ICD-10-CM | POA: Diagnosis not present

## 2021-07-10 DIAGNOSIS — G629 Polyneuropathy, unspecified: Secondary | ICD-10-CM | POA: Diagnosis not present

## 2021-07-10 DIAGNOSIS — I2510 Atherosclerotic heart disease of native coronary artery without angina pectoris: Secondary | ICD-10-CM | POA: Diagnosis not present

## 2021-07-10 DIAGNOSIS — Z79899 Other long term (current) drug therapy: Secondary | ICD-10-CM | POA: Diagnosis not present

## 2021-07-10 DIAGNOSIS — C8108 Nodular lymphocyte predominant Hodgkin lymphoma, lymph nodes of multiple sites: Secondary | ICD-10-CM | POA: Diagnosis not present

## 2021-07-10 DIAGNOSIS — Z5112 Encounter for antineoplastic immunotherapy: Secondary | ICD-10-CM | POA: Diagnosis not present

## 2021-07-10 DIAGNOSIS — Z87891 Personal history of nicotine dependence: Secondary | ICD-10-CM | POA: Diagnosis not present

## 2021-07-10 DIAGNOSIS — R5383 Other fatigue: Secondary | ICD-10-CM | POA: Diagnosis not present

## 2021-08-17 ENCOUNTER — Other Ambulatory Visit (HOSPITAL_COMMUNITY): Payer: Self-pay

## 2021-08-17 ENCOUNTER — Telehealth: Payer: Self-pay | Admitting: Pharmacy Technician

## 2021-08-17 NOTE — Telephone Encounter (Signed)
Patient has been receiving through Medical benefit. Patient has Medicare and [place]. Submitted benefits investigation through [employer]- [id number]   Awaiting Response.

## 2021-08-17 NOTE — Telephone Encounter (Signed)
-----   Message from Venetia Night, [place] sent at 08/13/2021  2:58 PM EDT ----- Regarding: PA-REnew Botox Good Afternoon, I have another patient here who needs a PA start for the new year. He is scheduled for June as well.

## 2021-08-18 NOTE — Telephone Encounter (Signed)
Received updated benefits from [employer]- SR #:  [id number]. Patient still good to continue Medical Buy/Bill

## 2021-08-21 DIAGNOSIS — C8108 Nodular lymphocyte predominant Hodgkin lymphoma, lymph nodes of multiple sites: Secondary | ICD-10-CM | POA: Diagnosis not present

## 2021-08-21 DIAGNOSIS — R079 Chest pain, unspecified: Secondary | ICD-10-CM | POA: Diagnosis not present

## 2021-08-21 DIAGNOSIS — J9859 Other diseases of mediastinum, not elsewhere classified: Secondary | ICD-10-CM | POA: Diagnosis not present

## 2021-08-21 DIAGNOSIS — G4730 Sleep apnea, unspecified: Secondary | ICD-10-CM | POA: Diagnosis not present

## 2021-08-21 DIAGNOSIS — M542 Cervicalgia: Secondary | ICD-10-CM | POA: Diagnosis not present

## 2021-08-27 DIAGNOSIS — C8108 Nodular lymphocyte predominant Hodgkin lymphoma, lymph nodes of multiple sites: Secondary | ICD-10-CM | POA: Diagnosis not present

## 2021-09-09 DIAGNOSIS — J9859 Other diseases of mediastinum, not elsewhere classified: Secondary | ICD-10-CM | POA: Diagnosis not present

## 2021-09-09 DIAGNOSIS — J939 Pneumothorax, unspecified: Secondary | ICD-10-CM | POA: Diagnosis not present

## 2021-09-09 DIAGNOSIS — Z87891 Personal history of nicotine dependence: Secondary | ICD-10-CM | POA: Diagnosis not present

## 2021-09-09 DIAGNOSIS — R918 Other nonspecific abnormal finding of lung field: Secondary | ICD-10-CM | POA: Diagnosis not present

## 2021-09-09 DIAGNOSIS — C8108 Nodular lymphocyte predominant Hodgkin lymphoma, lymph nodes of multiple sites: Secondary | ICD-10-CM | POA: Diagnosis not present

## 2021-09-11 ENCOUNTER — Ambulatory Visit: Payer: Medicare Other | Admitting: Neurology

## 2021-09-16 DIAGNOSIS — C8108 Nodular lymphocyte predominant Hodgkin lymphoma, lymph nodes of multiple sites: Secondary | ICD-10-CM | POA: Diagnosis not present

## 2021-09-16 DIAGNOSIS — R079 Chest pain, unspecified: Secondary | ICD-10-CM | POA: Diagnosis not present

## 2021-09-16 DIAGNOSIS — Z91018 Allergy to other foods: Secondary | ICD-10-CM | POA: Diagnosis not present

## 2021-09-16 DIAGNOSIS — Z888 Allergy status to other drugs, medicaments and biological substances status: Secondary | ICD-10-CM | POA: Diagnosis not present

## 2021-09-16 DIAGNOSIS — Z87891 Personal history of nicotine dependence: Secondary | ICD-10-CM | POA: Diagnosis not present

## 2021-09-22 DIAGNOSIS — C8108 Nodular lymphocyte predominant Hodgkin lymphoma, lymph nodes of multiple sites: Secondary | ICD-10-CM | POA: Diagnosis not present

## 2021-09-22 DIAGNOSIS — J939 Pneumothorax, unspecified: Secondary | ICD-10-CM | POA: Diagnosis not present

## 2021-09-22 DIAGNOSIS — J9859 Other diseases of mediastinum, not elsewhere classified: Secondary | ICD-10-CM | POA: Diagnosis not present

## 2021-09-22 DIAGNOSIS — C8100 Nodular lymphocyte predominant Hodgkin lymphoma, unspecified site: Secondary | ICD-10-CM | POA: Diagnosis not present

## 2021-09-22 DIAGNOSIS — L24A9 Irritant contact dermatitis due friction or contact with other specified body fluids: Secondary | ICD-10-CM | POA: Diagnosis not present

## 2021-09-22 DIAGNOSIS — R918 Other nonspecific abnormal finding of lung field: Secondary | ICD-10-CM | POA: Diagnosis not present

## 2021-09-25 ENCOUNTER — Ambulatory Visit: Payer: Medicare Other | Admitting: Internal Medicine

## 2021-10-06 DIAGNOSIS — C37 Malignant neoplasm of thymus: Secondary | ICD-10-CM | POA: Diagnosis not present

## 2021-10-06 DIAGNOSIS — D4989 Neoplasm of unspecified behavior of other specified sites: Secondary | ICD-10-CM | POA: Diagnosis not present

## 2021-10-16 ENCOUNTER — Ambulatory Visit (INDEPENDENT_AMBULATORY_CARE_PROVIDER_SITE_OTHER): Payer: Medicare Other | Admitting: Neurology

## 2021-10-16 DIAGNOSIS — G43709 Chronic migraine without aura, not intractable, without status migrainosus: Secondary | ICD-10-CM

## 2021-10-16 MED ORDER — ONABOTULINUMTOXINA 100 UNITS IJ SOLR
200.0000 [IU] | Freq: Once | INTRAMUSCULAR | Status: AC
  Administered 2021-10-16: 155 [IU] via INTRAMUSCULAR

## 2021-10-16 NOTE — Progress Notes (Unsigned)
[facility]  ° °Procedure Note Botox ° °Attending: Dr. Seichi Kaufhold ° °Preoperative Diagnosis(es): Chronic migraine ° °Consent obtained from: The patient °Benefits discussed included, but were not limited to decreased muscle tightness, increased joint range of motion, and decreased pain.  Risk discussed included, but were not limited pain and discomfort, bleeding, bruising, excessive weakness, venous thrombosis, muscle atrophy and dysphagia.  Anticipated outcomes of the procedure as well as he risks and benefits of the alternatives to the procedure, and the roles and tasks of the personnel to be involved, were discussed with the patient, and the patient consents to the procedure and agrees to proceed. A copy of the patient medication guide was given to the patient which explains the blackbox warning. ° °Patients identity and treatment sites confirmed Yes.  . ° °Details of Procedure: °Skin was cleaned with alcohol. Prior to injection, the needle plunger was aspirated to make sure the needle was not within a blood vessel.  There was no blood retrieved on aspiration.   ° °Following is a summary of the muscles injected  And the amount of Botulinum toxin used: ° °Dilution °200 units of Botox was reconstituted with 4 ml of preservative free normal saline. °Time of reconstitution: At the time of the office visit (<30 minutes prior to injection)  ° °Injections  °155 total units of Botox was injected with a 30 gauge needle. ° °Injection Sites: °L occipitalis: 15 units- 3 sites  °R occiptalis: 15 units- 3 sites ° °L upper trapezius: 15 units- 3 sites °R upper trapezius: 15 units- 3 sits          °L paraspinal: 10 units- 2 sites °R paraspinal: 10 units- 2 sites ° °Face °L frontalis(2 injection sites):10 units   °R frontalis(2 injection sites):10 units         °L corrugator: 5 units   °R corrugator: 5 units           °Procerus: 5 units   °L temporalis: 20 units °R temporalis: 20 units  ° °Agent:  °200 units of botulinum Type  A (Onobotulinum Toxin type A) was reconstituted with 4 ml of preservative free normal saline.  °Time of reconstitution: At the time of the office visit (<30 minutes prior to injection)  ° ° ° Total injected (Units):  155 ° Total wasted (Units):  45 ° °Patient tolerated procedure well without complications.   °Reinjection is anticipated in 3 months. ° ° °

## 2021-10-19 ENCOUNTER — Other Ambulatory Visit: Payer: Self-pay

## 2021-11-12 DIAGNOSIS — L821 Other seborrheic keratosis: Secondary | ICD-10-CM | POA: Diagnosis not present

## 2021-11-12 DIAGNOSIS — L249 Irritant contact dermatitis, unspecified cause: Secondary | ICD-10-CM | POA: Diagnosis not present

## 2021-11-12 DIAGNOSIS — L814 Other melanin hyperpigmentation: Secondary | ICD-10-CM | POA: Diagnosis not present

## 2021-11-12 DIAGNOSIS — D225 Melanocytic nevi of trunk: Secondary | ICD-10-CM | POA: Diagnosis not present

## 2021-11-18 DIAGNOSIS — Z87891 Personal history of nicotine dependence: Secondary | ICD-10-CM | POA: Diagnosis not present

## 2021-11-18 DIAGNOSIS — Z8571 Personal history of Hodgkin lymphoma: Secondary | ICD-10-CM | POA: Diagnosis not present

## 2021-11-18 DIAGNOSIS — D696 Thrombocytopenia, unspecified: Secondary | ICD-10-CM | POA: Diagnosis not present

## 2021-11-18 DIAGNOSIS — J948 Other specified pleural conditions: Secondary | ICD-10-CM | POA: Diagnosis not present

## 2021-11-18 DIAGNOSIS — G8918 Other acute postprocedural pain: Secondary | ICD-10-CM | POA: Diagnosis not present

## 2021-11-18 DIAGNOSIS — J9811 Atelectasis: Secondary | ICD-10-CM | POA: Diagnosis not present

## 2021-11-18 DIAGNOSIS — R918 Other nonspecific abnormal finding of lung field: Secondary | ICD-10-CM | POA: Diagnosis not present

## 2021-11-18 DIAGNOSIS — Z4682 Encounter for fitting and adjustment of non-vascular catheter: Secondary | ICD-10-CM | POA: Diagnosis not present

## 2021-11-18 DIAGNOSIS — C37 Malignant neoplasm of thymus: Secondary | ICD-10-CM | POA: Diagnosis not present

## 2021-11-18 DIAGNOSIS — Z9889 Other specified postprocedural states: Secondary | ICD-10-CM | POA: Diagnosis not present

## 2021-11-23 ENCOUNTER — Encounter: Payer: Self-pay | Admitting: Neurology

## 2021-11-23 ENCOUNTER — Encounter: Payer: Self-pay | Admitting: Internal Medicine

## 2021-11-23 ENCOUNTER — Telehealth: Payer: Self-pay

## 2021-11-23 NOTE — Telephone Encounter (Signed)
Transition Care Management Follow-up Telephone Call Date of discharge and from where: 11/21/2021, [facility] How have you been since you were released from the hospital? NO Any questions or concerns? No  Items Reviewed: Did the pt receive and understand the discharge instructions provided? Yes  Medications obtained and verified? Yes  Other? No  Any new allergies since your discharge? No  Dietary orders reviewed? No Do you have support at home? Yes   Home Care and Equipment/Supplies: Were home health services ordered? no If so, what is the name of the agency? N/A  Has the agency set up a time to come to the patient's home? not applicable Were any new equipment or medical supplies ordered?  No What is the name of the medical supply agency? N/A Were you able to get the supplies/equipment? not applicable Do you have any questions related to the use of the equipment or supplies? No  Functional Questionnaire: (I = Independent and D = Dependent) ADLs: I  Bathing/Dressing- I  Meal Prep- I  Eating- I  Maintaining continence- I  Transferring/Ambulation- I  Managing Meds- I  Follow up appointments reviewed:  PCP Hospital f/u appt confirmed? Yes  Scheduled to see Dr. Sharlet Salina on 12/28/2021 @ 9:00 am. [facility] f/u appt confirmed? No   Are transportation arrangements needed? No  If their condition worsens, is the pt aware to call PCP or go to the Emergency Dept.? Yes Was the patient provided with contact information for the PCP's office or ED? Yes Was to pt encouraged to call back with questions or concerns? Yes

## 2021-12-01 DIAGNOSIS — R918 Other nonspecific abnormal finding of lung field: Secondary | ICD-10-CM | POA: Diagnosis not present

## 2021-12-01 DIAGNOSIS — L03313 Cellulitis of chest wall: Secondary | ICD-10-CM | POA: Diagnosis not present

## 2021-12-01 DIAGNOSIS — Z9889 Other specified postprocedural states: Secondary | ICD-10-CM | POA: Diagnosis not present

## 2021-12-01 DIAGNOSIS — C37 Malignant neoplasm of thymus: Secondary | ICD-10-CM | POA: Diagnosis not present

## 2021-12-17 DIAGNOSIS — Z888 Allergy status to other drugs, medicaments and biological substances status: Secondary | ICD-10-CM | POA: Diagnosis not present

## 2021-12-17 DIAGNOSIS — Z9089 Acquired absence of other organs: Secondary | ICD-10-CM | POA: Diagnosis not present

## 2021-12-17 DIAGNOSIS — R918 Other nonspecific abnormal finding of lung field: Secondary | ICD-10-CM | POA: Diagnosis not present

## 2021-12-17 DIAGNOSIS — C37 Malignant neoplasm of thymus: Secondary | ICD-10-CM | POA: Diagnosis not present

## 2021-12-17 DIAGNOSIS — Z87891 Personal history of nicotine dependence: Secondary | ICD-10-CM | POA: Diagnosis not present

## 2021-12-17 DIAGNOSIS — C8100 Nodular lymphocyte predominant Hodgkin lymphoma, unspecified site: Secondary | ICD-10-CM | POA: Diagnosis not present

## 2021-12-17 DIAGNOSIS — Z510 Encounter for antineoplastic radiation therapy: Secondary | ICD-10-CM | POA: Diagnosis not present

## 2021-12-17 DIAGNOSIS — Z79899 Other long term (current) drug therapy: Secondary | ICD-10-CM | POA: Diagnosis not present

## 2021-12-23 DIAGNOSIS — Z79899 Other long term (current) drug therapy: Secondary | ICD-10-CM | POA: Diagnosis not present

## 2021-12-23 DIAGNOSIS — J984 Other disorders of lung: Secondary | ICD-10-CM | POA: Diagnosis not present

## 2021-12-23 DIAGNOSIS — Z888 Allergy status to other drugs, medicaments and biological substances status: Secondary | ICD-10-CM | POA: Diagnosis not present

## 2021-12-23 DIAGNOSIS — Z510 Encounter for antineoplastic radiation therapy: Secondary | ICD-10-CM | POA: Diagnosis not present

## 2021-12-23 DIAGNOSIS — C37 Malignant neoplasm of thymus: Secondary | ICD-10-CM | POA: Diagnosis not present

## 2021-12-23 DIAGNOSIS — C8100 Nodular lymphocyte predominant Hodgkin lymphoma, unspecified site: Secondary | ICD-10-CM | POA: Diagnosis not present

## 2021-12-23 DIAGNOSIS — R918 Other nonspecific abnormal finding of lung field: Secondary | ICD-10-CM | POA: Diagnosis not present

## 2021-12-23 DIAGNOSIS — Z87891 Personal history of nicotine dependence: Secondary | ICD-10-CM | POA: Diagnosis not present

## 2021-12-24 DIAGNOSIS — Z510 Encounter for antineoplastic radiation therapy: Secondary | ICD-10-CM | POA: Diagnosis not present

## 2021-12-24 DIAGNOSIS — Z79899 Other long term (current) drug therapy: Secondary | ICD-10-CM | POA: Diagnosis not present

## 2021-12-24 DIAGNOSIS — Z87891 Personal history of nicotine dependence: Secondary | ICD-10-CM | POA: Diagnosis not present

## 2021-12-24 DIAGNOSIS — C8100 Nodular lymphocyte predominant Hodgkin lymphoma, unspecified site: Secondary | ICD-10-CM | POA: Diagnosis not present

## 2021-12-24 DIAGNOSIS — C37 Malignant neoplasm of thymus: Secondary | ICD-10-CM | POA: Diagnosis not present

## 2021-12-24 DIAGNOSIS — Z888 Allergy status to other drugs, medicaments and biological substances status: Secondary | ICD-10-CM | POA: Diagnosis not present

## 2021-12-28 ENCOUNTER — Encounter: Payer: Self-pay | Admitting: Internal Medicine

## 2021-12-28 ENCOUNTER — Ambulatory Visit (INDEPENDENT_AMBULATORY_CARE_PROVIDER_SITE_OTHER): Payer: Medicare Other | Admitting: Internal Medicine

## 2021-12-28 VITALS — BP 120/68 | HR 66 | Temp 97.8°F | Ht 71.0 in | Wt 269.0 lb

## 2021-12-28 DIAGNOSIS — C8108 Nodular lymphocyte predominant Hodgkin lymphoma, lymph nodes of multiple sites: Secondary | ICD-10-CM

## 2021-12-28 DIAGNOSIS — Z23 Encounter for immunization: Secondary | ICD-10-CM

## 2021-12-28 DIAGNOSIS — J9859 Other diseases of mediastinum, not elsewhere classified: Secondary | ICD-10-CM

## 2021-12-28 DIAGNOSIS — G43711 Chronic migraine without aura, intractable, with status migrainosus: Secondary | ICD-10-CM

## 2021-12-28 NOTE — Patient Instructions (Signed)
We have given you the flu shot.

## 2021-12-28 NOTE — Progress Notes (Signed)
   Subjective:   Patient ID: Michael Conway, male    DOB: Aug 26, 1950, 71 y.o.   MRN: [id number]  HPI The patient is a 71 YO man coming in for follow up.  Review of Systems  Constitutional: Negative.   HENT: Negative.    Eyes: Negative.   Respiratory:  Negative for cough, chest tightness and shortness of breath.   Cardiovascular:  Positive for chest pain. Negative for palpitations and leg swelling.  Gastrointestinal:  Negative for abdominal distention, abdominal pain, constipation, diarrhea, nausea and vomiting.  Musculoskeletal: Negative.   Skin: Negative.   Neurological: Negative.   Psychiatric/Behavioral: Negative.      Objective:  Physical Exam Constitutional:      Appearance: He is well-developed.  HENT:     Head: Normocephalic and atraumatic.  Cardiovascular:     Rate and Rhythm: Normal rate and regular rhythm.     Comments: Scar midline with redness and lumpiness, no signs of infection Pulmonary:     Effort: Pulmonary effort is normal. No respiratory distress.     Breath sounds: Normal breath sounds. No wheezing or rales.  Abdominal:     General: Bowel sounds are normal. There is no distension.     Palpations: Abdomen is soft.     Tenderness: There is no abdominal tenderness. There is no rebound.  Musculoskeletal:     Cervical back: Normal range of motion.  Skin:    General: Skin is warm and dry.  Neurological:     Mental Status: He is alert and oriented to person, place, and time.     Coordination: Coordination normal.     Vitals:   12/28/21 0909  BP: 120/68  Pulse: 66  Temp: 97.8 F (36.6 C)  SpO2: 98%  Weight: 269 lb (122 kg)  Height: '5\' 11"'$  (1.803 m)    Assessment & Plan:  Flu shot given at visit

## 2021-12-28 NOTE — Assessment & Plan Note (Signed)
Overall stable and does botox for this.

## 2021-12-28 NOTE — Assessment & Plan Note (Signed)
About to start radiation in the next 2 weeks and this will be 6 week treatment. Scar is healing well and seeing oncology regularly for monitoring.

## 2021-12-28 NOTE — Assessment & Plan Note (Signed)
Still under monitoring and will get PET scan every 6 months.

## 2022-01-01 DIAGNOSIS — R918 Other nonspecific abnormal finding of lung field: Secondary | ICD-10-CM | POA: Diagnosis not present

## 2022-01-01 DIAGNOSIS — Z87891 Personal history of nicotine dependence: Secondary | ICD-10-CM | POA: Diagnosis not present

## 2022-01-01 DIAGNOSIS — Z79899 Other long term (current) drug therapy: Secondary | ICD-10-CM | POA: Diagnosis not present

## 2022-01-01 DIAGNOSIS — Z888 Allergy status to other drugs, medicaments and biological substances status: Secondary | ICD-10-CM | POA: Diagnosis not present

## 2022-01-01 DIAGNOSIS — C37 Malignant neoplasm of thymus: Secondary | ICD-10-CM | POA: Diagnosis not present

## 2022-01-01 DIAGNOSIS — Z510 Encounter for antineoplastic radiation therapy: Secondary | ICD-10-CM | POA: Diagnosis not present

## 2022-01-01 DIAGNOSIS — Z9089 Acquired absence of other organs: Secondary | ICD-10-CM | POA: Diagnosis not present

## 2022-01-01 DIAGNOSIS — C8100 Nodular lymphocyte predominant Hodgkin lymphoma, unspecified site: Secondary | ICD-10-CM | POA: Diagnosis not present

## 2022-01-05 DIAGNOSIS — Z888 Allergy status to other drugs, medicaments and biological substances status: Secondary | ICD-10-CM | POA: Diagnosis not present

## 2022-01-05 DIAGNOSIS — C37 Malignant neoplasm of thymus: Secondary | ICD-10-CM | POA: Diagnosis not present

## 2022-01-05 DIAGNOSIS — Z510 Encounter for antineoplastic radiation therapy: Secondary | ICD-10-CM | POA: Diagnosis not present

## 2022-01-05 DIAGNOSIS — Z87891 Personal history of nicotine dependence: Secondary | ICD-10-CM | POA: Diagnosis not present

## 2022-01-05 DIAGNOSIS — C8100 Nodular lymphocyte predominant Hodgkin lymphoma, unspecified site: Secondary | ICD-10-CM | POA: Diagnosis not present

## 2022-01-05 DIAGNOSIS — Z79899 Other long term (current) drug therapy: Secondary | ICD-10-CM | POA: Diagnosis not present

## 2022-01-06 DIAGNOSIS — Z87891 Personal history of nicotine dependence: Secondary | ICD-10-CM | POA: Diagnosis not present

## 2022-01-06 DIAGNOSIS — C8100 Nodular lymphocyte predominant Hodgkin lymphoma, unspecified site: Secondary | ICD-10-CM | POA: Diagnosis not present

## 2022-01-06 DIAGNOSIS — C37 Malignant neoplasm of thymus: Secondary | ICD-10-CM | POA: Diagnosis not present

## 2022-01-06 DIAGNOSIS — Z888 Allergy status to other drugs, medicaments and biological substances status: Secondary | ICD-10-CM | POA: Diagnosis not present

## 2022-01-06 DIAGNOSIS — Z510 Encounter for antineoplastic radiation therapy: Secondary | ICD-10-CM | POA: Diagnosis not present

## 2022-01-06 DIAGNOSIS — Z79899 Other long term (current) drug therapy: Secondary | ICD-10-CM | POA: Diagnosis not present

## 2022-01-07 DIAGNOSIS — Z888 Allergy status to other drugs, medicaments and biological substances status: Secondary | ICD-10-CM | POA: Diagnosis not present

## 2022-01-07 DIAGNOSIS — Z87891 Personal history of nicotine dependence: Secondary | ICD-10-CM | POA: Diagnosis not present

## 2022-01-07 DIAGNOSIS — Z79899 Other long term (current) drug therapy: Secondary | ICD-10-CM | POA: Diagnosis not present

## 2022-01-07 DIAGNOSIS — Z510 Encounter for antineoplastic radiation therapy: Secondary | ICD-10-CM | POA: Diagnosis not present

## 2022-01-07 DIAGNOSIS — C37 Malignant neoplasm of thymus: Secondary | ICD-10-CM | POA: Diagnosis not present

## 2022-01-07 DIAGNOSIS — C8100 Nodular lymphocyte predominant Hodgkin lymphoma, unspecified site: Secondary | ICD-10-CM | POA: Diagnosis not present

## 2022-01-08 DIAGNOSIS — C37 Malignant neoplasm of thymus: Secondary | ICD-10-CM | POA: Diagnosis not present

## 2022-01-08 DIAGNOSIS — Z510 Encounter for antineoplastic radiation therapy: Secondary | ICD-10-CM | POA: Diagnosis not present

## 2022-01-08 DIAGNOSIS — Z87891 Personal history of nicotine dependence: Secondary | ICD-10-CM | POA: Diagnosis not present

## 2022-01-08 DIAGNOSIS — Z888 Allergy status to other drugs, medicaments and biological substances status: Secondary | ICD-10-CM | POA: Diagnosis not present

## 2022-01-08 DIAGNOSIS — Z79899 Other long term (current) drug therapy: Secondary | ICD-10-CM | POA: Diagnosis not present

## 2022-01-08 DIAGNOSIS — C8100 Nodular lymphocyte predominant Hodgkin lymphoma, unspecified site: Secondary | ICD-10-CM | POA: Diagnosis not present

## 2022-01-11 DIAGNOSIS — Z510 Encounter for antineoplastic radiation therapy: Secondary | ICD-10-CM | POA: Diagnosis not present

## 2022-01-11 DIAGNOSIS — Z888 Allergy status to other drugs, medicaments and biological substances status: Secondary | ICD-10-CM | POA: Diagnosis not present

## 2022-01-11 DIAGNOSIS — Z87891 Personal history of nicotine dependence: Secondary | ICD-10-CM | POA: Diagnosis not present

## 2022-01-11 DIAGNOSIS — C8100 Nodular lymphocyte predominant Hodgkin lymphoma, unspecified site: Secondary | ICD-10-CM | POA: Diagnosis not present

## 2022-01-11 DIAGNOSIS — Z79899 Other long term (current) drug therapy: Secondary | ICD-10-CM | POA: Diagnosis not present

## 2022-01-11 DIAGNOSIS — C37 Malignant neoplasm of thymus: Secondary | ICD-10-CM | POA: Diagnosis not present

## 2022-01-12 DIAGNOSIS — C8100 Nodular lymphocyte predominant Hodgkin lymphoma, unspecified site: Secondary | ICD-10-CM | POA: Diagnosis not present

## 2022-01-12 DIAGNOSIS — Z79899 Other long term (current) drug therapy: Secondary | ICD-10-CM | POA: Diagnosis not present

## 2022-01-12 DIAGNOSIS — C37 Malignant neoplasm of thymus: Secondary | ICD-10-CM | POA: Diagnosis not present

## 2022-01-12 DIAGNOSIS — Z888 Allergy status to other drugs, medicaments and biological substances status: Secondary | ICD-10-CM | POA: Diagnosis not present

## 2022-01-12 DIAGNOSIS — Z510 Encounter for antineoplastic radiation therapy: Secondary | ICD-10-CM | POA: Diagnosis not present

## 2022-01-12 DIAGNOSIS — Z87891 Personal history of nicotine dependence: Secondary | ICD-10-CM | POA: Diagnosis not present

## 2022-01-13 DIAGNOSIS — Z79899 Other long term (current) drug therapy: Secondary | ICD-10-CM | POA: Diagnosis not present

## 2022-01-13 DIAGNOSIS — Z87891 Personal history of nicotine dependence: Secondary | ICD-10-CM | POA: Diagnosis not present

## 2022-01-13 DIAGNOSIS — Z510 Encounter for antineoplastic radiation therapy: Secondary | ICD-10-CM | POA: Diagnosis not present

## 2022-01-13 DIAGNOSIS — C8100 Nodular lymphocyte predominant Hodgkin lymphoma, unspecified site: Secondary | ICD-10-CM | POA: Diagnosis not present

## 2022-01-13 DIAGNOSIS — Z888 Allergy status to other drugs, medicaments and biological substances status: Secondary | ICD-10-CM | POA: Diagnosis not present

## 2022-01-13 DIAGNOSIS — C37 Malignant neoplasm of thymus: Secondary | ICD-10-CM | POA: Diagnosis not present

## 2022-01-14 DIAGNOSIS — C37 Malignant neoplasm of thymus: Secondary | ICD-10-CM | POA: Diagnosis not present

## 2022-01-14 DIAGNOSIS — Z510 Encounter for antineoplastic radiation therapy: Secondary | ICD-10-CM | POA: Diagnosis not present

## 2022-01-14 DIAGNOSIS — Z79899 Other long term (current) drug therapy: Secondary | ICD-10-CM | POA: Diagnosis not present

## 2022-01-14 DIAGNOSIS — Z87891 Personal history of nicotine dependence: Secondary | ICD-10-CM | POA: Diagnosis not present

## 2022-01-14 DIAGNOSIS — C8100 Nodular lymphocyte predominant Hodgkin lymphoma, unspecified site: Secondary | ICD-10-CM | POA: Diagnosis not present

## 2022-01-14 DIAGNOSIS — Z888 Allergy status to other drugs, medicaments and biological substances status: Secondary | ICD-10-CM | POA: Diagnosis not present

## 2022-01-15 ENCOUNTER — Ambulatory Visit (INDEPENDENT_AMBULATORY_CARE_PROVIDER_SITE_OTHER): Payer: Medicare Other | Admitting: Neurology

## 2022-01-15 DIAGNOSIS — C37 Malignant neoplasm of thymus: Secondary | ICD-10-CM | POA: Diagnosis not present

## 2022-01-15 DIAGNOSIS — C8100 Nodular lymphocyte predominant Hodgkin lymphoma, unspecified site: Secondary | ICD-10-CM | POA: Diagnosis not present

## 2022-01-15 DIAGNOSIS — G43709 Chronic migraine without aura, not intractable, without status migrainosus: Secondary | ICD-10-CM | POA: Diagnosis not present

## 2022-01-15 DIAGNOSIS — Z87891 Personal history of nicotine dependence: Secondary | ICD-10-CM | POA: Diagnosis not present

## 2022-01-15 DIAGNOSIS — Z888 Allergy status to other drugs, medicaments and biological substances status: Secondary | ICD-10-CM | POA: Diagnosis not present

## 2022-01-15 DIAGNOSIS — Z510 Encounter for antineoplastic radiation therapy: Secondary | ICD-10-CM | POA: Diagnosis not present

## 2022-01-15 DIAGNOSIS — Z79899 Other long term (current) drug therapy: Secondary | ICD-10-CM | POA: Diagnosis not present

## 2022-01-15 MED ORDER — ONABOTULINUMTOXINA 100 UNITS IJ SOLR
200.0000 [IU] | Freq: Once | INTRAMUSCULAR | Status: AC
  Administered 2022-01-15: 155 [IU] via INTRAMUSCULAR

## 2022-01-15 NOTE — Progress Notes (Signed)
[facility]  ° °Procedure Note Botox ° °Attending: Dr. Rayne Cowdrey ° °Preoperative Diagnosis(es): Chronic migraine ° °Consent obtained from: The patient °Benefits discussed included, but were not limited to decreased muscle tightness, increased joint range of motion, and decreased pain.  Risk discussed included, but were not limited pain and discomfort, bleeding, bruising, excessive weakness, venous thrombosis, muscle atrophy and dysphagia.  Anticipated outcomes of the procedure as well as he risks and benefits of the alternatives to the procedure, and the roles and tasks of the personnel to be involved, were discussed with the patient, and the patient consents to the procedure and agrees to proceed. A copy of the patient medication guide was given to the patient which explains the blackbox warning. ° °Patients identity and treatment sites confirmed Yes.  . ° °Details of Procedure: °Skin was cleaned with alcohol. Prior to injection, the needle plunger was aspirated to make sure the needle was not within a blood vessel.  There was no blood retrieved on aspiration.   ° °Following is a summary of the muscles injected  And the amount of Botulinum toxin used: ° °Dilution °200 units of Botox was reconstituted with 4 ml of preservative free normal saline. °Time of reconstitution: At the time of the office visit (<30 minutes prior to injection)  ° °Injections  °155 total units of Botox was injected with a 30 gauge needle. ° °Injection Sites: °L occipitalis: 15 units- 3 sites  °R occiptalis: 15 units- 3 sites ° °L upper trapezius: 15 units- 3 sites °R upper trapezius: 15 units- 3 sits          °L paraspinal: 10 units- 2 sites °R paraspinal: 10 units- 2 sites ° °Face °L frontalis(2 injection sites):10 units   °R frontalis(2 injection sites):10 units         °L corrugator: 5 units   °R corrugator: 5 units           °Procerus: 5 units   °L temporalis: 20 units °R temporalis: 20 units  ° °Agent:  °200 units of botulinum Type  A (Onobotulinum Toxin type A) was reconstituted with 4 ml of preservative free normal saline.  °Time of reconstitution: At the time of the office visit (<30 minutes prior to injection)  ° ° ° Total injected (Units):  155 ° Total wasted (Units):  45 ° °Patient tolerated procedure well without complications.   °Reinjection is anticipated in 3 months. ° ° °

## 2022-01-18 DIAGNOSIS — C37 Malignant neoplasm of thymus: Secondary | ICD-10-CM | POA: Diagnosis not present

## 2022-01-18 DIAGNOSIS — Z79899 Other long term (current) drug therapy: Secondary | ICD-10-CM | POA: Diagnosis not present

## 2022-01-18 DIAGNOSIS — Z510 Encounter for antineoplastic radiation therapy: Secondary | ICD-10-CM | POA: Diagnosis not present

## 2022-01-18 DIAGNOSIS — C8100 Nodular lymphocyte predominant Hodgkin lymphoma, unspecified site: Secondary | ICD-10-CM | POA: Diagnosis not present

## 2022-01-18 DIAGNOSIS — Z888 Allergy status to other drugs, medicaments and biological substances status: Secondary | ICD-10-CM | POA: Diagnosis not present

## 2022-01-18 DIAGNOSIS — Z87891 Personal history of nicotine dependence: Secondary | ICD-10-CM | POA: Diagnosis not present

## 2022-01-19 DIAGNOSIS — Z510 Encounter for antineoplastic radiation therapy: Secondary | ICD-10-CM | POA: Diagnosis not present

## 2022-01-19 DIAGNOSIS — Z888 Allergy status to other drugs, medicaments and biological substances status: Secondary | ICD-10-CM | POA: Diagnosis not present

## 2022-01-19 DIAGNOSIS — C37 Malignant neoplasm of thymus: Secondary | ICD-10-CM | POA: Diagnosis not present

## 2022-01-19 DIAGNOSIS — Z79899 Other long term (current) drug therapy: Secondary | ICD-10-CM | POA: Diagnosis not present

## 2022-01-19 DIAGNOSIS — C8100 Nodular lymphocyte predominant Hodgkin lymphoma, unspecified site: Secondary | ICD-10-CM | POA: Diagnosis not present

## 2022-01-19 DIAGNOSIS — Z87891 Personal history of nicotine dependence: Secondary | ICD-10-CM | POA: Diagnosis not present

## 2022-01-20 DIAGNOSIS — C37 Malignant neoplasm of thymus: Secondary | ICD-10-CM | POA: Diagnosis not present

## 2022-01-20 DIAGNOSIS — Z510 Encounter for antineoplastic radiation therapy: Secondary | ICD-10-CM | POA: Diagnosis not present

## 2022-01-20 DIAGNOSIS — Z87891 Personal history of nicotine dependence: Secondary | ICD-10-CM | POA: Diagnosis not present

## 2022-01-20 DIAGNOSIS — Z79899 Other long term (current) drug therapy: Secondary | ICD-10-CM | POA: Diagnosis not present

## 2022-01-20 DIAGNOSIS — C8100 Nodular lymphocyte predominant Hodgkin lymphoma, unspecified site: Secondary | ICD-10-CM | POA: Diagnosis not present

## 2022-01-20 DIAGNOSIS — Z888 Allergy status to other drugs, medicaments and biological substances status: Secondary | ICD-10-CM | POA: Diagnosis not present

## 2022-01-21 DIAGNOSIS — C37 Malignant neoplasm of thymus: Secondary | ICD-10-CM | POA: Diagnosis not present

## 2022-01-21 DIAGNOSIS — Z510 Encounter for antineoplastic radiation therapy: Secondary | ICD-10-CM | POA: Diagnosis not present

## 2022-01-21 DIAGNOSIS — C8100 Nodular lymphocyte predominant Hodgkin lymphoma, unspecified site: Secondary | ICD-10-CM | POA: Diagnosis not present

## 2022-01-21 DIAGNOSIS — Z79899 Other long term (current) drug therapy: Secondary | ICD-10-CM | POA: Diagnosis not present

## 2022-01-21 DIAGNOSIS — Z87891 Personal history of nicotine dependence: Secondary | ICD-10-CM | POA: Diagnosis not present

## 2022-01-21 DIAGNOSIS — Z888 Allergy status to other drugs, medicaments and biological substances status: Secondary | ICD-10-CM | POA: Diagnosis not present

## 2022-01-22 DIAGNOSIS — C37 Malignant neoplasm of thymus: Secondary | ICD-10-CM | POA: Diagnosis not present

## 2022-01-22 DIAGNOSIS — Z510 Encounter for antineoplastic radiation therapy: Secondary | ICD-10-CM | POA: Diagnosis not present

## 2022-01-22 DIAGNOSIS — Z79899 Other long term (current) drug therapy: Secondary | ICD-10-CM | POA: Diagnosis not present

## 2022-01-22 DIAGNOSIS — C8100 Nodular lymphocyte predominant Hodgkin lymphoma, unspecified site: Secondary | ICD-10-CM | POA: Diagnosis not present

## 2022-01-22 DIAGNOSIS — Z888 Allergy status to other drugs, medicaments and biological substances status: Secondary | ICD-10-CM | POA: Diagnosis not present

## 2022-01-22 DIAGNOSIS — Z87891 Personal history of nicotine dependence: Secondary | ICD-10-CM | POA: Diagnosis not present

## 2022-01-25 DIAGNOSIS — Z87891 Personal history of nicotine dependence: Secondary | ICD-10-CM | POA: Diagnosis not present

## 2022-01-25 DIAGNOSIS — C37 Malignant neoplasm of thymus: Secondary | ICD-10-CM | POA: Diagnosis not present

## 2022-01-25 DIAGNOSIS — C8100 Nodular lymphocyte predominant Hodgkin lymphoma, unspecified site: Secondary | ICD-10-CM | POA: Diagnosis not present

## 2022-01-25 DIAGNOSIS — Z888 Allergy status to other drugs, medicaments and biological substances status: Secondary | ICD-10-CM | POA: Diagnosis not present

## 2022-01-25 DIAGNOSIS — Z510 Encounter for antineoplastic radiation therapy: Secondary | ICD-10-CM | POA: Diagnosis not present

## 2022-01-25 DIAGNOSIS — Z79899 Other long term (current) drug therapy: Secondary | ICD-10-CM | POA: Diagnosis not present

## 2022-01-26 DIAGNOSIS — Z87891 Personal history of nicotine dependence: Secondary | ICD-10-CM | POA: Diagnosis not present

## 2022-01-26 DIAGNOSIS — Z888 Allergy status to other drugs, medicaments and biological substances status: Secondary | ICD-10-CM | POA: Diagnosis not present

## 2022-01-26 DIAGNOSIS — C37 Malignant neoplasm of thymus: Secondary | ICD-10-CM | POA: Diagnosis not present

## 2022-01-26 DIAGNOSIS — Z510 Encounter for antineoplastic radiation therapy: Secondary | ICD-10-CM | POA: Diagnosis not present

## 2022-01-26 DIAGNOSIS — Z79899 Other long term (current) drug therapy: Secondary | ICD-10-CM | POA: Diagnosis not present

## 2022-01-26 DIAGNOSIS — C8100 Nodular lymphocyte predominant Hodgkin lymphoma, unspecified site: Secondary | ICD-10-CM | POA: Diagnosis not present

## 2022-01-27 DIAGNOSIS — R918 Other nonspecific abnormal finding of lung field: Secondary | ICD-10-CM | POA: Diagnosis not present

## 2022-01-27 DIAGNOSIS — Z9089 Acquired absence of other organs: Secondary | ICD-10-CM | POA: Diagnosis not present

## 2022-01-27 DIAGNOSIS — Z79899 Other long term (current) drug therapy: Secondary | ICD-10-CM | POA: Diagnosis not present

## 2022-01-27 DIAGNOSIS — Z510 Encounter for antineoplastic radiation therapy: Secondary | ICD-10-CM | POA: Diagnosis not present

## 2022-01-27 DIAGNOSIS — C8100 Nodular lymphocyte predominant Hodgkin lymphoma, unspecified site: Secondary | ICD-10-CM | POA: Diagnosis not present

## 2022-01-27 DIAGNOSIS — C37 Malignant neoplasm of thymus: Secondary | ICD-10-CM | POA: Diagnosis not present

## 2022-01-27 DIAGNOSIS — Z888 Allergy status to other drugs, medicaments and biological substances status: Secondary | ICD-10-CM | POA: Diagnosis not present

## 2022-01-27 DIAGNOSIS — Z87891 Personal history of nicotine dependence: Secondary | ICD-10-CM | POA: Diagnosis not present

## 2022-01-28 DIAGNOSIS — C8100 Nodular lymphocyte predominant Hodgkin lymphoma, unspecified site: Secondary | ICD-10-CM | POA: Diagnosis not present

## 2022-01-28 DIAGNOSIS — C37 Malignant neoplasm of thymus: Secondary | ICD-10-CM | POA: Diagnosis not present

## 2022-01-28 DIAGNOSIS — Z888 Allergy status to other drugs, medicaments and biological substances status: Secondary | ICD-10-CM | POA: Diagnosis not present

## 2022-01-28 DIAGNOSIS — Z79899 Other long term (current) drug therapy: Secondary | ICD-10-CM | POA: Diagnosis not present

## 2022-01-28 DIAGNOSIS — Z87891 Personal history of nicotine dependence: Secondary | ICD-10-CM | POA: Diagnosis not present

## 2022-01-28 DIAGNOSIS — Z510 Encounter for antineoplastic radiation therapy: Secondary | ICD-10-CM | POA: Diagnosis not present

## 2022-01-29 DIAGNOSIS — C8100 Nodular lymphocyte predominant Hodgkin lymphoma, unspecified site: Secondary | ICD-10-CM | POA: Diagnosis not present

## 2022-01-29 DIAGNOSIS — Z888 Allergy status to other drugs, medicaments and biological substances status: Secondary | ICD-10-CM | POA: Diagnosis not present

## 2022-01-29 DIAGNOSIS — C37 Malignant neoplasm of thymus: Secondary | ICD-10-CM | POA: Diagnosis not present

## 2022-01-29 DIAGNOSIS — Z510 Encounter for antineoplastic radiation therapy: Secondary | ICD-10-CM | POA: Diagnosis not present

## 2022-01-29 DIAGNOSIS — Z79899 Other long term (current) drug therapy: Secondary | ICD-10-CM | POA: Diagnosis not present

## 2022-01-29 DIAGNOSIS — Z87891 Personal history of nicotine dependence: Secondary | ICD-10-CM | POA: Diagnosis not present

## 2022-02-01 DIAGNOSIS — Z510 Encounter for antineoplastic radiation therapy: Secondary | ICD-10-CM | POA: Diagnosis not present

## 2022-02-01 DIAGNOSIS — Z888 Allergy status to other drugs, medicaments and biological substances status: Secondary | ICD-10-CM | POA: Diagnosis not present

## 2022-02-01 DIAGNOSIS — Z87891 Personal history of nicotine dependence: Secondary | ICD-10-CM | POA: Diagnosis not present

## 2022-02-01 DIAGNOSIS — C8100 Nodular lymphocyte predominant Hodgkin lymphoma, unspecified site: Secondary | ICD-10-CM | POA: Diagnosis not present

## 2022-02-01 DIAGNOSIS — C37 Malignant neoplasm of thymus: Secondary | ICD-10-CM | POA: Diagnosis not present

## 2022-02-01 DIAGNOSIS — Z79899 Other long term (current) drug therapy: Secondary | ICD-10-CM | POA: Diagnosis not present

## 2022-02-02 DIAGNOSIS — C37 Malignant neoplasm of thymus: Secondary | ICD-10-CM | POA: Diagnosis not present

## 2022-02-02 DIAGNOSIS — Z888 Allergy status to other drugs, medicaments and biological substances status: Secondary | ICD-10-CM | POA: Diagnosis not present

## 2022-02-02 DIAGNOSIS — Z87891 Personal history of nicotine dependence: Secondary | ICD-10-CM | POA: Diagnosis not present

## 2022-02-02 DIAGNOSIS — Z79899 Other long term (current) drug therapy: Secondary | ICD-10-CM | POA: Diagnosis not present

## 2022-02-02 DIAGNOSIS — C8100 Nodular lymphocyte predominant Hodgkin lymphoma, unspecified site: Secondary | ICD-10-CM | POA: Diagnosis not present

## 2022-02-02 DIAGNOSIS — Z510 Encounter for antineoplastic radiation therapy: Secondary | ICD-10-CM | POA: Diagnosis not present

## 2022-02-03 DIAGNOSIS — Z79899 Other long term (current) drug therapy: Secondary | ICD-10-CM | POA: Diagnosis not present

## 2022-02-03 DIAGNOSIS — Z87891 Personal history of nicotine dependence: Secondary | ICD-10-CM | POA: Diagnosis not present

## 2022-02-03 DIAGNOSIS — Z510 Encounter for antineoplastic radiation therapy: Secondary | ICD-10-CM | POA: Diagnosis not present

## 2022-02-03 DIAGNOSIS — C8100 Nodular lymphocyte predominant Hodgkin lymphoma, unspecified site: Secondary | ICD-10-CM | POA: Diagnosis not present

## 2022-02-03 DIAGNOSIS — C37 Malignant neoplasm of thymus: Secondary | ICD-10-CM | POA: Diagnosis not present

## 2022-02-03 DIAGNOSIS — Z888 Allergy status to other drugs, medicaments and biological substances status: Secondary | ICD-10-CM | POA: Diagnosis not present

## 2022-02-04 DIAGNOSIS — Z79899 Other long term (current) drug therapy: Secondary | ICD-10-CM | POA: Diagnosis not present

## 2022-02-04 DIAGNOSIS — C8100 Nodular lymphocyte predominant Hodgkin lymphoma, unspecified site: Secondary | ICD-10-CM | POA: Diagnosis not present

## 2022-02-04 DIAGNOSIS — Z87891 Personal history of nicotine dependence: Secondary | ICD-10-CM | POA: Diagnosis not present

## 2022-02-04 DIAGNOSIS — Z888 Allergy status to other drugs, medicaments and biological substances status: Secondary | ICD-10-CM | POA: Diagnosis not present

## 2022-02-04 DIAGNOSIS — C37 Malignant neoplasm of thymus: Secondary | ICD-10-CM | POA: Diagnosis not present

## 2022-02-04 DIAGNOSIS — Z510 Encounter for antineoplastic radiation therapy: Secondary | ICD-10-CM | POA: Diagnosis not present

## 2022-02-05 DIAGNOSIS — Z87891 Personal history of nicotine dependence: Secondary | ICD-10-CM | POA: Diagnosis not present

## 2022-02-05 DIAGNOSIS — Z79899 Other long term (current) drug therapy: Secondary | ICD-10-CM | POA: Diagnosis not present

## 2022-02-05 DIAGNOSIS — C8100 Nodular lymphocyte predominant Hodgkin lymphoma, unspecified site: Secondary | ICD-10-CM | POA: Diagnosis not present

## 2022-02-05 DIAGNOSIS — C37 Malignant neoplasm of thymus: Secondary | ICD-10-CM | POA: Diagnosis not present

## 2022-02-05 DIAGNOSIS — Z510 Encounter for antineoplastic radiation therapy: Secondary | ICD-10-CM | POA: Diagnosis not present

## 2022-02-05 DIAGNOSIS — Z888 Allergy status to other drugs, medicaments and biological substances status: Secondary | ICD-10-CM | POA: Diagnosis not present

## 2022-02-08 DIAGNOSIS — C37 Malignant neoplasm of thymus: Secondary | ICD-10-CM | POA: Diagnosis not present

## 2022-02-08 DIAGNOSIS — Z510 Encounter for antineoplastic radiation therapy: Secondary | ICD-10-CM | POA: Diagnosis not present

## 2022-02-08 DIAGNOSIS — Z79899 Other long term (current) drug therapy: Secondary | ICD-10-CM | POA: Diagnosis not present

## 2022-02-08 DIAGNOSIS — Z87891 Personal history of nicotine dependence: Secondary | ICD-10-CM | POA: Diagnosis not present

## 2022-02-08 DIAGNOSIS — C8100 Nodular lymphocyte predominant Hodgkin lymphoma, unspecified site: Secondary | ICD-10-CM | POA: Diagnosis not present

## 2022-02-08 DIAGNOSIS — Z888 Allergy status to other drugs, medicaments and biological substances status: Secondary | ICD-10-CM | POA: Diagnosis not present

## 2022-02-10 DIAGNOSIS — C37 Malignant neoplasm of thymus: Secondary | ICD-10-CM | POA: Diagnosis not present

## 2022-02-10 DIAGNOSIS — Z510 Encounter for antineoplastic radiation therapy: Secondary | ICD-10-CM | POA: Diagnosis not present

## 2022-02-11 DIAGNOSIS — C37 Malignant neoplasm of thymus: Secondary | ICD-10-CM | POA: Diagnosis not present

## 2022-02-11 DIAGNOSIS — Z510 Encounter for antineoplastic radiation therapy: Secondary | ICD-10-CM | POA: Diagnosis not present

## 2022-02-12 DIAGNOSIS — Z510 Encounter for antineoplastic radiation therapy: Secondary | ICD-10-CM | POA: Diagnosis not present

## 2022-02-12 DIAGNOSIS — C37 Malignant neoplasm of thymus: Secondary | ICD-10-CM | POA: Diagnosis not present

## 2022-02-15 DIAGNOSIS — C37 Malignant neoplasm of thymus: Secondary | ICD-10-CM | POA: Diagnosis not present

## 2022-02-15 DIAGNOSIS — Z510 Encounter for antineoplastic radiation therapy: Secondary | ICD-10-CM | POA: Diagnosis not present

## 2022-02-16 DIAGNOSIS — C37 Malignant neoplasm of thymus: Secondary | ICD-10-CM | POA: Diagnosis not present

## 2022-02-16 DIAGNOSIS — Z510 Encounter for antineoplastic radiation therapy: Secondary | ICD-10-CM | POA: Diagnosis not present

## 2022-02-17 DIAGNOSIS — Z510 Encounter for antineoplastic radiation therapy: Secondary | ICD-10-CM | POA: Diagnosis not present

## 2022-02-17 DIAGNOSIS — C37 Malignant neoplasm of thymus: Secondary | ICD-10-CM | POA: Diagnosis not present

## 2022-03-04 ENCOUNTER — Encounter: Payer: Self-pay | Admitting: Internal Medicine

## 2022-03-09 ENCOUNTER — Encounter: Payer: Self-pay | Admitting: Internal Medicine

## 2022-03-09 ENCOUNTER — Ambulatory Visit (INDEPENDENT_AMBULATORY_CARE_PROVIDER_SITE_OTHER): Payer: Medicare Other | Admitting: Internal Medicine

## 2022-03-09 VITALS — BP 128/80 | HR 75 | Temp 97.6°F | Ht 71.0 in | Wt 278.0 lb

## 2022-03-09 DIAGNOSIS — R21 Rash and other nonspecific skin eruption: Secondary | ICD-10-CM | POA: Diagnosis not present

## 2022-03-09 MED ORDER — CEPHALEXIN 500 MG PO CAPS: 500.0000 mg | ORAL_CAPSULE | Freq: Two times a day (BID) | ORAL | 0 refills | Status: AC

## 2022-03-09 NOTE — Progress Notes (Signed)
   Subjective:   Patient ID: Michael Conway, male    DOB: 1950/07/06, 71 y.o.   MRN: [id number]  HPI The patient is a 71 YO man coming in for rash on upper body with lesions that have opened. Went to [place] urgent care and they gave him antibiotics and steroids but pharmacy would not fill both. They gave him prednisone only.  Review of Systems  Constitutional: Negative.   HENT: Negative.    Eyes: Negative.   Respiratory:  Negative for cough, chest tightness and shortness of breath.   Cardiovascular:  Negative for chest pain, palpitations and leg swelling.  Gastrointestinal:  Negative for abdominal distention, abdominal pain, constipation, diarrhea, nausea and vomiting.  Musculoskeletal: Negative.   Skin:  Positive for rash.  Neurological: Negative.   Psychiatric/Behavioral: Negative.      Objective:  Physical Exam Constitutional:      Appearance: He is well-developed.  HENT:     Head: Normocephalic and atraumatic.  Cardiovascular:     Rate and Rhythm: Normal rate and regular rhythm.  Pulmonary:     Effort: Pulmonary effort is normal. No respiratory distress.     Breath sounds: Normal breath sounds. No wheezing or rales.  Abdominal:     General: Bowel sounds are normal. There is no distension.     Palpations: Abdomen is soft.     Tenderness: There is no abdominal tenderness. There is no rebound.  Musculoskeletal:     Cervical back: Normal range of motion.  Skin:    General: Skin is warm and dry.     Findings: Rash present.     Comments: Lesions erythematous with skin breakdown no purulent discharge on the arms and chest wall  Neurological:     Mental Status: He is alert and oriented to person, place, and time.     Coordination: Coordination normal.     Vitals:   03/09/22 0929  BP: 128/80  Pulse: 75  Temp: 97.6 F (36.4 C)  TempSrc: Oral  SpO2: 95%  Weight: 278 lb (126.1 kg)  Height: '5\' 11"'$  (1.803 m)    Assessment & Plan:

## 2022-03-09 NOTE — Patient Instructions (Signed)
Take 40 mg today, then 20 mg Wednesday and Thursday and then stop.  We have sent in keflex to take 1 pill twice a day for 5 days.

## 2022-03-09 NOTE — Assessment & Plan Note (Signed)
Concern for skin infection versus drug reaction. Prednisone has helped itching and redness. We will taper him off this instructions given due to side effects. Rx keflex 5 day course to cover for infection.

## 2022-03-10 ENCOUNTER — Other Ambulatory Visit (HOSPITAL_COMMUNITY): Payer: Self-pay

## 2022-03-12 ENCOUNTER — Other Ambulatory Visit (HOSPITAL_COMMUNITY): Payer: Self-pay

## 2022-03-12 DIAGNOSIS — R110 Nausea: Secondary | ICD-10-CM | POA: Diagnosis not present

## 2022-03-12 DIAGNOSIS — F411 Generalized anxiety disorder: Secondary | ICD-10-CM | POA: Diagnosis not present

## 2022-03-12 DIAGNOSIS — F32A Depression, unspecified: Secondary | ICD-10-CM | POA: Diagnosis not present

## 2022-03-12 DIAGNOSIS — G629 Polyneuropathy, unspecified: Secondary | ICD-10-CM | POA: Diagnosis not present

## 2022-03-12 DIAGNOSIS — Z888 Allergy status to other drugs, medicaments and biological substances status: Secondary | ICD-10-CM | POA: Diagnosis not present

## 2022-03-12 DIAGNOSIS — Z87891 Personal history of nicotine dependence: Secondary | ICD-10-CM | POA: Diagnosis not present

## 2022-03-12 DIAGNOSIS — Z9102 Food additives allergy status: Secondary | ICD-10-CM | POA: Diagnosis not present

## 2022-03-12 DIAGNOSIS — Z79899 Other long term (current) drug therapy: Secondary | ICD-10-CM | POA: Diagnosis not present

## 2022-03-12 DIAGNOSIS — C8108 Nodular lymphocyte predominant Hodgkin lymphoma, lymph nodes of multiple sites: Secondary | ICD-10-CM | POA: Diagnosis not present

## 2022-04-01 ENCOUNTER — Telehealth: Payer: Self-pay

## 2022-04-01 NOTE — Telephone Encounter (Signed)
New PA needed for 2024

## 2022-04-05 ENCOUNTER — Other Ambulatory Visit (HOSPITAL_COMMUNITY): Payer: Self-pay

## 2022-04-05 ENCOUNTER — Telehealth: Payer: Self-pay | Admitting: Pharmacy Technician

## 2022-04-05 NOTE — Telephone Encounter (Signed)
Patient Advocate Encounter  Received notification from [facility] that prior authorization for BOTOX 200U is required.   PA submitted on 1.8.24 Key [id number] Status is pending

## 2022-04-05 NOTE — Telephone Encounter (Signed)
PA has been submitted, and telephone encounter has been created. 

## 2022-04-13 NOTE — Telephone Encounter (Signed)
No PA is required through Medicare B and [place]

## 2022-04-13 NOTE — Telephone Encounter (Signed)
Patient advised.

## 2022-04-13 NOTE — Telephone Encounter (Signed)
Patient is buy/bill- still has active Medicare B and Supplement- please disregard auth through part D

## 2022-04-14 DIAGNOSIS — Z452 Encounter for adjustment and management of vascular access device: Secondary | ICD-10-CM | POA: Diagnosis not present

## 2022-04-14 DIAGNOSIS — C8190 Hodgkin lymphoma, unspecified, unspecified site: Secondary | ICD-10-CM | POA: Diagnosis not present

## 2022-04-15 ENCOUNTER — Other Ambulatory Visit (HOSPITAL_COMMUNITY): Payer: Self-pay

## 2022-04-16 ENCOUNTER — Other Ambulatory Visit: Payer: Self-pay

## 2022-04-16 ENCOUNTER — Ambulatory Visit (INDEPENDENT_AMBULATORY_CARE_PROVIDER_SITE_OTHER): Payer: Medicare Other | Admitting: Neurology

## 2022-04-16 DIAGNOSIS — G43709 Chronic migraine without aura, not intractable, without status migrainosus: Secondary | ICD-10-CM | POA: Diagnosis not present

## 2022-04-16 MED ORDER — ONABOTULINUMTOXINA 100 UNITS IJ SOLR
200.0000 [IU] | Freq: Once | INTRAMUSCULAR | Status: AC
  Administered 2022-04-16: 155 [IU] via INTRAMUSCULAR

## 2022-04-16 NOTE — Progress Notes (Signed)
[facility]  ° °Procedure Note Botox ° °Attending: Dr. Chloeanne Poteet ° °Preoperative Diagnosis(es): Chronic migraine ° °Consent obtained from: The patient °Benefits discussed included, but were not limited to decreased muscle tightness, increased joint range of motion, and decreased pain.  Risk discussed included, but were not limited pain and discomfort, bleeding, bruising, excessive weakness, venous thrombosis, muscle atrophy and dysphagia.  Anticipated outcomes of the procedure as well as he risks and benefits of the alternatives to the procedure, and the roles and tasks of the personnel to be involved, were discussed with the patient, and the patient consents to the procedure and agrees to proceed. A copy of the patient medication guide was given to the patient which explains the blackbox warning. ° °Patients identity and treatment sites confirmed Yes.  . ° °Details of Procedure: °Skin was cleaned with alcohol. Prior to injection, the needle plunger was aspirated to make sure the needle was not within a blood vessel.  There was no blood retrieved on aspiration.   ° °Following is a summary of the muscles injected  And the amount of Botulinum toxin used: ° °Dilution °200 units of Botox was reconstituted with 4 ml of preservative free normal saline. °Time of reconstitution: At the time of the office visit (<30 minutes prior to injection)  ° °Injections  °155 total units of Botox was injected with a 30 gauge needle. ° °Injection Sites: °L occipitalis: 15 units- 3 sites  °R occiptalis: 15 units- 3 sites ° °L upper trapezius: 15 units- 3 sites °R upper trapezius: 15 units- 3 sits          °L paraspinal: 10 units- 2 sites °R paraspinal: 10 units- 2 sites ° °Face °L frontalis(2 injection sites):10 units   °R frontalis(2 injection sites):10 units         °L corrugator: 5 units   °R corrugator: 5 units           °Procerus: 5 units   °L temporalis: 20 units °R temporalis: 20 units  ° °Agent:  °200 units of botulinum Type  A (Onobotulinum Toxin type A) was reconstituted with 4 ml of preservative free normal saline.  °Time of reconstitution: At the time of the office visit (<30 minutes prior to injection)  ° ° ° Total injected (Units):  155 ° Total wasted (Units):  45 ° °Patient tolerated procedure well without complications.   °Reinjection is anticipated in 3 months. ° ° °

## 2022-06-07 DIAGNOSIS — R09A2 Foreign body sensation, throat: Secondary | ICD-10-CM | POA: Diagnosis not present

## 2022-06-07 DIAGNOSIS — K219 Gastro-esophageal reflux disease without esophagitis: Secondary | ICD-10-CM | POA: Diagnosis not present

## 2022-06-14 ENCOUNTER — Telehealth: Payer: Self-pay

## 2022-06-14 NOTE — Telephone Encounter (Signed)
Called patient to schedule Medicare Annual Wellness Visit (AWV). Left message for patient to call back and schedule Medicare Annual Wellness Visit (AWV).  *Called patient to reschedule 06/18/22 appointment*  Last date of AWV: 06/16/21  Please schedule an appointment at any time with [facility].  Norton Blizzard, [place] (AAMA)  [place] [facility] [phone]

## 2022-06-15 ENCOUNTER — Telehealth: Payer: Self-pay

## 2022-06-15 NOTE — Telephone Encounter (Signed)
[place] to schedule their annual wellness visit. Appointment made for 06/18/22.  Appointment time changed from 8:45 to 9:00, patient is aware of this change.  Norton Blizzard, [place] (AAMA)  [place] [facility] [phone]

## 2022-06-18 ENCOUNTER — Ambulatory Visit: Payer: Medicare Other

## 2022-06-18 ENCOUNTER — Ambulatory Visit (INDEPENDENT_AMBULATORY_CARE_PROVIDER_SITE_OTHER): Payer: Medicare Other | Admitting: *Deleted

## 2022-06-18 VITALS — Ht 71.0 in | Wt 274.0 lb

## 2022-06-18 DIAGNOSIS — Z0000 Encounter for general adult medical examination without abnormal findings: Secondary | ICD-10-CM

## 2022-06-18 NOTE — Progress Notes (Signed)
Subjective:   Michael Conway is a 72 y.o. male who presents for Medicare Annual/Subsequent preventive examination.  I connected with  Elpidio Anis on 06/18/22 by a audio enabled telemedicine application and verified that I am speaking with the correct person using two identifiers.  Patient Location: Home  Provider Location: Office/Clinic  I discussed the limitations of evaluation and management by telemedicine. The patient expressed understanding and agreed to proceed.   Review of Systems    Defer to PCP  Cardiac Risk Factors include: advanced age (>56men, >67 women);obesity (BMI >30kg/m2)    Please see problem list for additional risk factors Objective:    Today's Vitals   06/18/22 0900  Weight: 274 lb (124.3 kg)  Height: 5\' 11"  (1.803 m)   Body mass index is 38.22 kg/m.     06/18/2022    9:03 AM 06/16/2021    8:47 AM 01/29/2021    9:35 AM 01/07/2021    9:14 PM 10/26/2019   10:28 AM 09/26/2019    8:24 PM 05/15/2019   12:35 PM  Advanced Directives  Does Patient Have a Medical Advance Directive? Yes No Yes No No No Yes  Type of Advance Directive Living will;Healthcare Power of [place];Living will      Copy of [place] in Chart? No - copy requested  No - copy requested      Would patient like information on creating a medical advance directive?  Yes (MAU/Ambulatory/Procedural Areas - Information given)  No - Patient declined  No - Patient declined     Current Medications (verified) Outpatient Encounter Medications as of 06/18/2022  Medication Sig   Cholecalciferol (VITAMIN D3) 125 MCG (5000 UT) CAPS Take 5,000 Units by mouth every other day.   escitalopram (LEXAPRO) 10 MG tablet Take 10 mg by mouth once.   Magnesium 500 MG TABS Take 500 mg by mouth daily.   Multiple Vitamins-Minerals (CENTRUM SILVER 50+MEN PO) Take 1 tablet by mouth daily with breakfast.   naproxen sodium (ALEVE) 220 MG tablet Take 220-440 mg by  mouth 2 (two) times daily as needed (for headaches or migraines).   OnabotulinumtoxinA (BOTOX IJ) Inject as directed every 3 (three) months.   Polyvinyl Alcohol-Povidone PF (REFRESH) 1.4-0.6 % SOLN Place 1 drop into both eyes daily as needed (dry eye).   triamcinolone cream (KENALOG) 0.1 % Apply 1 application topically daily as needed (Eczema).   Whey Protein POWD Take 1 Scoop by mouth See admin instructions. Mix 1 scoopful into preferred beverage and drink by mouth once a day   Zinc 50 MG TABS Take 50 mg by mouth daily.   [DISCONTINUED] testosterone cypionate (DEPOTESTOSTERONE CYPIONATE) 200 MG/ML injection Inject 200 mg into the muscle every 14 (fourteen) days.   No facility-administered encounter medications on file as of 06/18/2022.    Allergies (verified) Peppermint oil, Benzodiazepines, and Other   History: Past Medical History:  Diagnosis Date   Allergy    Arthritis    Colon polyps    Depression    Hearing impaired person, bilateral    Low testosterone    Migraines    OSA (obstructive sleep apnea) 09/27/2019   Vestibular migraine    Past Surgical History:  Procedure Laterality Date   BICEPS TENDON REPAIR Left    INNER EAR SURGERY Right    KNEE ARTHROSCOPY Left    ROTATOR CUFF REPAIR Bilateral    SUPRACLAVICAL NODE BIOPSY Right 01/30/2021   Procedure: EXCISION RIGHT SUPRACLAVICULAR LYMPH  NODE;  Surgeon: [name], MD;  Location: [facility] OR;  Service: Thoracic;  Laterality: Right;   TYMPANOSTOMY TUBE PLACEMENT     WISDOM TOOTH EXTRACTION     Family History  Problem Relation Age of Onset   Prostate cancer Other    Heart attack Mother 38   Heart failure Mother    Hypertension Mother    Dementia Father 65   Parkinsonism Father    Colon polyps Father    Lung cancer Sister    Melanoma Sister    Lung disease Sister    Hypertension Sister    Colon cancer Neg Hx    Stomach cancer Neg Hx    Rectal cancer Neg Hx    Esophageal cancer Neg Hx    Social History    Socioeconomic History   Marital status: Single    Spouse name: Not on file   Number of children: 2   Years of education: Not on file   Highest education level: Bachelor's degree (e.g., BA, AB, BS)  Occupational History   Occupation: retired  Tobacco Use   Smoking status: Former    Types: Cigarettes    Quit date: 2002    Years since quitting: 22.2   Smokeless tobacco: Never  Vaping Use   Vaping Use: Never used  Substance and Sexual Activity   Alcohol use: Yes    Comment: occasionally   Drug use: No   Sexual activity: Yes    Partners: Female  Other Topics Concern   Not on file  Social History Narrative   Patient is right-handed. He lives in a single level home. He drinks 3 cups of coffee a day and rare tea or soda. He walks daily.       Education - college grad   Social Determinants of Health   Financial Resource Strain: Low Risk  (06/18/2022)   Overall Financial Resource Strain (CARDIA)    Difficulty of Paying Living Expenses: Not hard at all  Food Insecurity: No Food Insecurity (06/18/2022)   Hunger Vital Sign    Worried About Running Out of Food in the Last Year: Never true    Ran Out of Food in the Last Year: Never true  Transportation Needs: No Transportation Needs (06/18/2022)   PRAPARE - Hydrologist (Medical): No    Lack of Transportation (Non-Medical): No  Physical Activity: Sufficiently Active (06/18/2022)   Exercise Vital Sign    Days of Exercise per Week: 5 days    Minutes of Exercise per Session: 100 min  Stress: No Stress Concern Present (06/18/2022)   [place]    Feeling of Stress : Not at all  Social Connections: Moderately Isolated (06/18/2022)   Social Connection and Isolation Panel [NHANES]    Frequency of Communication with Friends and Family: More than three times a week    Frequency of Social Gatherings with Friends and Family: More than three times a  week    Attends Religious Services: Never    Marine scientist or Organizations: No    Attends Music therapist: Never    Marital Status: Living with partner    Tobacco Counseling Counseling given: Not Answered   Clinical Intake:  Pre-visit preparation completed: No  Pain : No/denies pain     Nutritional Status: BMI > 30  Obese Nutritional Risks: None Diabetes: No  How often do you need to have someone help you when you  read instructions, pamphlets, or other written materials from your doctor or pharmacy?: 1 - Never What is the last grade level you completed in school?: 4 year college  Diabetic?no  Interpreter Needed?: No      Activities of Daily Living    06/18/2022    9:08 AM  In your present state of health, do you have any difficulty performing the following activities:  Hearing? 1  Comment Patient wears hearing aides  Vision? 0  Difficulty concentrating or making decisions? 0  Walking or climbing stairs? 1  Dressing or bathing? 0  Doing errands, shopping? 0  Preparing Food and eating ? N  Using the Toilet? N  In the past six months, have you accidently leaked urine? N  Do you have problems with loss of bowel control? N  Managing your Medications? N  Managing your Finances? N  Housekeeping or managing your Housekeeping? N    Patient Care Team: Hoyt Koch, MD as PCP - General (Internal Medicine) Pieter Partridge, DO as Consulting Physician (Neurology) Macarthur Critchley, [place] as Referring Physician (Optometry)  Indicate any recent Medical Services you may have received from other than [facility] providers in the past year (date may be approximate).     Assessment:   This is a routine wellness examination for [place].  Hearing/Vision screen Vision Screening - Comments:: Annual eye exam, [facility]  Dietary issues and exercise activities discussed: Current Exercise Habits: Home exercise routine, Time (Minutes): > 60, Frequency  (Times/Week): 5, Weekly Exercise (Minutes/Week): 0, Intensity: Moderate, Exercise limited by: None identified   Goals Addressed   None   Depression Screen    06/18/2022    9:12 AM 06/18/2022    9:07 AM 03/09/2022    9:37 AM 06/16/2021    9:02 AM 01/21/2021    8:58 AM 12/27/2018    8:43 AM 06/17/2016    1:01 PM  PHQ 2/9 Scores  PHQ - 2 Score 0 0 0 2 0 1 0  PHQ- 9 Score   0   4     Fall Risk    03/09/2022    9:37 AM 06/16/2021    8:49 AM 01/21/2021    8:58 AM 10/26/2019   10:28 AM 05/15/2019   12:35 PM  Fall Risk   Falls in the past year? 0 1 1 0 1  Number falls in past yr: 0 1 0 0 1  Injury with Fall? 0 0 0 0 0  Risk for fall due to :  Other (Comment)     Risk for fall due to: Comment  vertigo and migraines     Follow up Falls evaluation completed Falls evaluation completed       [place]:  Any stairs in or around the home? No  If so, are there any without handrails? No  Home free of loose throw rugs in walkways, pet beds, electrical cords, etc? Yes  Adequate lighting in your home to reduce risk of falls? Yes   ASSISTIVE DEVICES UTILIZED TO PREVENT FALLS:  Life alert? No  Use of a cane, walker or w/c? No  Grab bars in the bathroom? No  Shower chair or bench in shower? No  Elevated toilet seat or a handicapped toilet? No   TIMED UP AND GO:  Was the test performed? No .  Length of time to ambulate 10 feet: n/a sec.     Cognitive Function:        06/18/2022  9:12 AM  6CIT Screen  What Year? 0 points  What month? 0 points  What time? 0 points  Count back from 20 0 points  Months in reverse 0 points  Repeat phrase 0 points  Total Score 0 points    Immunizations Immunization History  Administered Date(s) Administered   Fluad Quad(high Dose 65+) 12/27/2018, 02/28/2020, 12/28/2021   Influenza,inj,Quad PF,6+ Mos 01/14/2016, 03/31/2018   Influenza,inj,quad, With Preservative 04/09/2014   Influenza-Unspecified 01/14/2016,  03/31/2018, 11/01/2018, 02/16/2020, 03/17/2021, 12/27/2021   PFIZER(Purple Top)SARS-COV-2 Vaccination 05/12/2019, 06/02/2019, 11/28/2019, 01/06/2020   Pfizer Covid-19 Vaccine Bivalent Booster 36yrs & up 03/17/2021   Tdap 08/27/2016, 01/27/2018    TDAP status: Up to date  Flu Vaccine status: Up to date  Pneumococcal vaccine status: Due, Education has been provided regarding the importance of this vaccine. Advised may receive this vaccine at local pharmacy or Health Dept. Aware to provide a copy of the vaccination record if obtained from local pharmacy or Health Dept. Verbalized acceptance and understanding.  Covid-19 vaccine status: Information provided on how to obtain vaccines.   Qualifies for Shingles Vaccine? Yes   Zostavax completed No   Shingrix Completed?: No.    Education has been provided regarding the importance of this vaccine. Patient has been advised to call insurance company to determine out of pocket expense if they have not yet received this vaccine. Advised may also receive vaccine at local pharmacy or Health Dept. Verbalized acceptance and understanding.  Screening Tests Health Maintenance  Topic Date Due   Zoster Vaccines- Shingrix (1 of 2) Never done   COVID-19 Vaccine (6 - 2023-24 season) 11/27/2021   Pneumonia Vaccine 58+ Years old (1 of 2 - PCV) 03/10/2023 (Originally 09/07/1956)   Medicare Annual Wellness (AWV)  06/18/2023   COLONOSCOPY (Pts 45-7yrs Insurance coverage will need to be confirmed)  09/16/2023   DTaP/Tdap/Td (3 - Td or Tdap) 01/28/2028   INFLUENZA VACCINE  Completed   Hepatitis C Screening  Completed   HPV VACCINES  Aged Out    Health Maintenance  Health Maintenance Due  Topic Date Due   Zoster Vaccines- Shingrix (1 of 2) Never done   COVID-19 Vaccine (6 - 2023-24 season) 11/27/2021    Colorectal cancer screening: Type of screening: Colonoscopy. Completed 09/15/2020. Repeat every 3 years  Lung Cancer Screening: (Low Dose CT Chest  recommended if Age 34-80 years, 30 pack-year currently smoking OR have quit w/in 15years.) does not qualify.   Lung Cancer Screening Referral: n/a  Additional Screening:  Hepatitis C Screening: does qualify; Completed 02/23/2021  Vision Screening: Recommended annual ophthalmology exams for early detection of glaucoma and other disorders of the eye. Is the patient up to date with their annual eye exam?  Yes  Who is the provider or what is the name of the office in which the patient attends annual eye exams? Dr. Nicki Reaper [facility] If pt is not established with a provider, would they like to be referred to a provider to establish care? No .   Dental Screening: Recommended annual dental exams for proper oral hygiene  Community Resource Referral / Chronic Care Management: CRR required this visit?  No   CCM required this visit?  No      Plan:     I have personally reviewed and noted the following in the patient's chart:   Medical and social history Use of alcohol, tobacco or illicit drugs  Current medications and supplements including opioid prescriptions. Patient is not currently taking opioid prescriptions. Functional  ability and status Nutritional status Physical activity Advanced directives List of other physicians Hospitalizations, surgeries, and ER visits in previous 12 months Vitals Screenings to include cognitive, depression, and falls Referrals and appointments  In addition, I have reviewed and discussed with patient certain preventive protocols, quality metrics, and best practice recommendations. A written personalized care plan for preventive services as well as general preventive health recommendations were provided to patient.     Mylinda Latina, CMA   06/18/2022  Non face to face 30 minutes  Nurse Notes:  Mr. Bois , Thank you for taking time to come for your Medicare Wellness Visit. I appreciate your ongoing commitment to your health goals.  Please review the following plan we discussed and let me know if I can assist you in the future.   These are the goals we discussed:  Goals       Patient Stated (pt-stated)      My goal is to get back to normal again after this cancer.        This is a list of the screening recommended for you and due dates:  Health Maintenance  Topic Date Due   Zoster (Shingles) Vaccine (1 of 2) Never done   COVID-19 Vaccine (6 - 2023-24 season) 11/27/2021   Pneumonia Vaccine (1 of 2 - PCV) 03/10/2023*   Medicare Annual Wellness Visit  06/18/2023   Colon Cancer Screening  09/16/2023   DTaP/Tdap/Td vaccine (3 - Td or Tdap) 01/28/2028   Flu Shot  Completed   Hepatitis C Screening: USPSTF Recommendation to screen - Ages 18-79 yo.  Completed   HPV Vaccine  Aged Out  *Topic was postponed. The date shown is not the original due date.

## 2022-06-22 NOTE — Progress Notes (Signed)
Subjective:   Michael Conway is a 72 y.o. male who presents for Medicare Annual/Subsequent preventive examination.  I connected with  Michael Conway on 06/22/22 by a audio enabled telemedicine application and verified that I am speaking with the correct person using two identifiers.  Patient Location: Home  Provider Location: Office/Clinic  I discussed the limitations of evaluation and management by telemedicine. The patient expressed understanding and agreed to proceed.   Review of Systems    Defer to PCP  Cardiac Risk Factors include: advanced age (>61men, >58 women);obesity (BMI >30kg/m2)    Please see problem list for additional risk factors Objective:    Today's Vitals   06/18/22 0900  Weight: 274 lb (124.3 kg)  Height: 5\' 11"  (1.803 m)   Body mass index is 38.22 kg/m.     06/18/2022    9:03 AM 06/16/2021    8:47 AM 01/29/2021    9:35 AM 01/07/2021    9:14 PM 10/26/2019   10:28 AM 09/26/2019    8:24 PM 05/15/2019   12:35 PM  Advanced Directives  Does Patient Have a Medical Advance Directive? Yes No Yes No No No Yes  Type of Advance Directive Living will;Healthcare Power of [place];Living will      Copy of [place] in Chart? No - copy requested  No - copy requested      Would patient like information on creating a medical advance directive?  Yes (MAU/Ambulatory/Procedural Areas - Information given)  No - Patient declined  No - Patient declined     Current Medications (verified) Outpatient Encounter Medications as of 06/18/2022  Medication Sig   Cholecalciferol (VITAMIN D3) 125 MCG (5000 UT) CAPS Take 5,000 Units by mouth every other day.   escitalopram (LEXAPRO) 10 MG tablet Take 10 mg by mouth once.   Magnesium 500 MG TABS Take 500 mg by mouth daily.   Multiple Vitamins-Minerals (CENTRUM SILVER 50+MEN PO) Take 1 tablet by mouth daily with breakfast.   naproxen sodium (ALEVE) 220 MG tablet Take 220-440 mg by  mouth 2 (two) times daily as needed (for headaches or migraines).   OnabotulinumtoxinA (BOTOX IJ) Inject as directed every 3 (three) months.   Polyvinyl Alcohol-Povidone PF (REFRESH) 1.4-0.6 % SOLN Place 1 drop into both eyes daily as needed (dry eye).   triamcinolone cream (KENALOG) 0.1 % Apply 1 application topically daily as needed (Eczema).   Whey Protein POWD Take 1 Scoop by mouth See admin instructions. Mix 1 scoopful into preferred beverage and drink by mouth once a day   Zinc 50 MG TABS Take 50 mg by mouth daily.   [DISCONTINUED] testosterone cypionate (DEPOTESTOSTERONE CYPIONATE) 200 MG/ML injection Inject 200 mg into the muscle every 14 (fourteen) days.   No facility-administered encounter medications on file as of 06/18/2022.    Allergies (verified) Peppermint oil, Benzodiazepines, and Other   History: Past Medical History:  Diagnosis Date   Allergy    Arthritis    Colon polyps    Depression    Hearing impaired person, bilateral    Low testosterone    Migraines    OSA (obstructive sleep apnea) 09/27/2019   Vestibular migraine    Past Surgical History:  Procedure Laterality Date   BICEPS TENDON REPAIR Left    INNER EAR SURGERY Right    KNEE ARTHROSCOPY Left    ROTATOR CUFF REPAIR Bilateral    SUPRACLAVICAL NODE BIOPSY Right 01/30/2021   Procedure: EXCISION RIGHT SUPRACLAVICULAR LYMPH  NODE;  Surgeon: [name], MD;  Location: [facility] OR;  Service: Thoracic;  Laterality: Right;   TYMPANOSTOMY TUBE PLACEMENT     WISDOM TOOTH EXTRACTION     Family History  Problem Relation Age of Onset   Prostate cancer Other    Heart attack Mother 16   Heart failure Mother    Hypertension Mother    Dementia Father 26   Parkinsonism Father    Colon polyps Father    Lung cancer Sister    Melanoma Sister    Lung disease Sister    Hypertension Sister    Colon cancer Neg Hx    Stomach cancer Neg Hx    Rectal cancer Neg Hx    Esophageal cancer Neg Hx    Social History    Socioeconomic History   Marital status: Single    Spouse name: Not on file   Number of children: 2   Years of education: Not on file   Highest education level: Bachelor's degree (e.g., BA, AB, BS)  Occupational History   Occupation: retired  Tobacco Use   Smoking status: Former    Types: Cigarettes    Quit date: 2002    Years since quitting: 22.2   Smokeless tobacco: Never  Vaping Use   Vaping Use: Never used  Substance and Sexual Activity   Alcohol use: Yes    Comment: occasionally   Drug use: No   Sexual activity: Yes    Partners: Female  Other Topics Concern   Not on file  Social History Narrative   Patient is right-handed. He lives in a single level home. He drinks 3 cups of coffee a day and rare tea or soda. He walks daily.       Education - college grad   Social Determinants of Health   Financial Resource Strain: Low Risk  (06/18/2022)   Overall Financial Resource Strain (CARDIA)    Difficulty of Paying Living Expenses: Not hard at all  Food Insecurity: No Food Insecurity (06/18/2022)   Hunger Vital Sign    Worried About Running Out of Food in the Last Year: Never true    Ran Out of Food in the Last Year: Never true  Transportation Needs: No Transportation Needs (06/18/2022)   PRAPARE - Hydrologist (Medical): No    Lack of Transportation (Non-Medical): No  Physical Activity: Sufficiently Active (06/18/2022)   Exercise Vital Sign    Days of Exercise per Week: 5 days    Minutes of Exercise per Session: 100 min  Stress: No Stress Concern Present (06/18/2022)   [place]    Feeling of Stress : Not at all  Social Connections: Moderately Isolated (06/18/2022)   Social Connection and Isolation Panel [NHANES]    Frequency of Communication with Friends and Family: More than three times a week    Frequency of Social Gatherings with Friends and Family: More than three times a  week    Attends Religious Services: Never    Marine scientist or Organizations: No    Attends Music therapist: Never    Marital Status: Living with partner    Tobacco Counseling Counseling given: Not Answered   Clinical Intake:  Pre-visit preparation completed: No  Pain : No/denies pain     Nutritional Status: BMI > 30  Obese Nutritional Risks: None Diabetes: No  How often do you need to have someone help you when you  read instructions, pamphlets, or other written materials from your doctor or pharmacy?: 1 - Never What is the last grade level you completed in school?: 4 year college  Diabetic?no  Interpreter Needed?: No      Activities of Daily Living    06/18/2022    9:08 AM  In your present state of health, do you have any difficulty performing the following activities:  Hearing? 1  Comment Patient wears hearing aides  Vision? 0  Difficulty concentrating or making decisions? 0  Walking or climbing stairs? 1  Dressing or bathing? 0  Doing errands, shopping? 0  Preparing Food and eating ? N  Using the Toilet? N  In the past six months, have you accidently leaked urine? N  Do you have problems with loss of bowel control? N  Managing your Medications? N  Managing your Finances? N  Housekeeping or managing your Housekeeping? N    Patient Care Team: Hoyt Koch, MD as PCP - General (Internal Medicine) Pieter Partridge, DO as Consulting Physician (Neurology) Macarthur Critchley, [place] as Referring Physician (Optometry)  Indicate any recent Medical Services you may have received from other than [facility] providers in the past year (date may be approximate).     Assessment:   This is a routine wellness examination for Jef.  Hearing/Vision screen Vision Screening - Comments:: Annual eye exam, [facility]  Dietary issues and exercise activities discussed: Current Exercise Habits: Home exercise routine, Time (Minutes): > 60, Frequency  (Times/Week): 5, Weekly Exercise (Minutes/Week): 0, Intensity: Moderate, Exercise limited by: None identified   Goals Addressed   None   Depression Screen    06/18/2022    9:12 AM 06/18/2022    9:07 AM 03/09/2022    9:37 AM 06/16/2021    9:02 AM 01/21/2021    8:58 AM 12/27/2018    8:43 AM 06/17/2016    1:01 PM  PHQ 2/9 Scores  PHQ - 2 Score 0 0 0 2 0 1 0  PHQ- 9 Score   0   4     Fall Risk    06/21/2022   12:58 PM 03/09/2022    9:37 AM 06/16/2021    8:49 AM 01/21/2021    8:58 AM 10/26/2019   10:28 AM  [place] in the past year? 0 0 1 1 0  Number falls in past yr: 0 0 1 0 0  Injury with Fall? 0 0 0 0 0  Risk for fall due to : No Fall Risks  Other (Comment)    Risk for fall due to: Comment   vertigo and migraines    Follow up Falls evaluation completed Falls evaluation completed Falls evaluation completed      [place]:  Any stairs in or around the home? No  If so, are there any without handrails? No  Home free of loose throw rugs in walkways, pet beds, electrical cords, etc? Yes  Adequate lighting in your home to reduce risk of falls? Yes   ASSISTIVE DEVICES UTILIZED TO PREVENT FALLS:  Life alert? No  Use of a cane, walker or w/c? No  Grab bars in the bathroom? No  Shower chair or bench in shower? No  Elevated toilet seat or a handicapped toilet? No   TIMED UP AND GO:  Was the test performed? No .  Length of time to ambulate 10 feet: n/a sec.     Cognitive Function:  06/18/2022    9:12 AM  6CIT Screen  What Year? 0 points  What month? 0 points  What time? 0 points  Count back from 20 0 points  Months in reverse 0 points  Repeat phrase 0 points  Total Score 0 points    Immunizations Immunization History  Administered Date(s) Administered   Fluad Quad(high Dose 65+) 12/27/2018, 02/28/2020, 12/28/2021   Influenza,inj,Quad PF,6+ Mos 01/14/2016, 03/31/2018   Influenza,inj,quad, With Preservative  04/09/2014   Influenza-Unspecified 01/14/2016, 03/31/2018, 11/01/2018, 02/16/2020, 03/17/2021, 12/27/2021   PFIZER(Purple Top)SARS-COV-2 Vaccination 05/12/2019, 06/02/2019, 11/28/2019, 01/06/2020   Pfizer Covid-19 Vaccine Bivalent Booster 70yrs & up 03/17/2021   Tdap 08/27/2016, 01/27/2018    TDAP status: Up to date  Flu Vaccine status: Up to date  Pneumococcal vaccine status: Due, Education has been provided regarding the importance of this vaccine. Advised may receive this vaccine at local pharmacy or Health Dept. Aware to provide a copy of the vaccination record if obtained from local pharmacy or Health Dept. Verbalized acceptance and understanding.  Covid-19 vaccine status: Information provided on how to obtain vaccines.   Qualifies for Shingles Vaccine? Yes   Zostavax completed No   Shingrix Completed?: No.    Education has been provided regarding the importance of this vaccine. Patient has been advised to call insurance company to determine out of pocket expense if they have not yet received this vaccine. Advised may also receive vaccine at local pharmacy or Health Dept. Verbalized acceptance and understanding.  Screening Tests Health Maintenance  Topic Date Due   COVID-19 Vaccine (6 - 2023-24 season) 11/27/2021   Zoster Vaccines- Shingrix (1 of 2) 12/19/2022 (Originally 09/07/1969)   Pneumonia Vaccine 1+ Years old (1 of 2 - PCV) 03/10/2023 (Originally 09/07/1956)   Medicare Annual Wellness (AWV)  06/18/2023   COLONOSCOPY (Pts 45-1yrs Insurance coverage will need to be confirmed)  09/16/2023   DTaP/Tdap/Td (3 - Td or Tdap) 01/28/2028   INFLUENZA VACCINE  Completed   Hepatitis C Screening  Completed   HPV VACCINES  Aged Out    Health Maintenance  Health Maintenance Due  Topic Date Due   COVID-19 Vaccine (6 - 2023-24 season) 11/27/2021    Colorectal cancer screening: Type of screening: Colonoscopy. Completed 09/15/2020. Repeat every 3 years  Lung Cancer Screening: (Low  Dose CT Chest recommended if Age 63-80 years, 30 pack-year currently smoking OR have quit w/in 15years.) does not qualify.   Lung Cancer Screening Referral: n/a  Additional Screening:  Hepatitis C Screening: does qualify; Completed 02/23/2021  Vision Screening: Recommended annual ophthalmology exams for early detection of glaucoma and other disorders of the eye. Is the patient up to date with their annual eye exam?  Yes  Who is the provider or what is the name of the office in which the patient attends annual eye exams? Dr. Nicki Reaper [facility] If pt is not established with a provider, would they like to be referred to a provider to establish care? No .   Dental Screening: Recommended annual dental exams for proper oral hygiene  Community Resource Referral / Chronic Care Management: CRR required this visit?  No   CCM required this visit?  No      Plan:     I have personally reviewed and noted the following in the patient's chart:   Medical and social history Use of alcohol, tobacco or illicit drugs  Current medications and supplements including opioid prescriptions. Patient is not currently taking opioid prescriptions. Functional ability and status Nutritional status  Physical activity Advanced directives List of other physicians Hospitalizations, surgeries, and ER visits in previous 12 months Vitals Screenings to include cognitive, depression, and falls Referrals and appointments  In addition, I have reviewed and discussed with patient certain preventive protocols, quality metrics, and best practice recommendations. A written personalized care plan for preventive services as well as general preventive health recommendations were provided to patient.     Mylinda Latina, CMA   06/22/2022  Non face to face 30 minutes  Nurse Notes:  Mr. Foltyn , Thank you for taking time to come for your Medicare Wellness Visit. I appreciate your ongoing commitment to your  health goals. Please review the following plan we discussed and let me know if I can assist you in the future.   These are the goals we discussed:  Goals       Patient Stated (pt-stated)      My goal is to get back to normal again after this cancer.        This is a list of the screening recommended for you and due dates:  Health Maintenance  Topic Date Due   COVID-19 Vaccine (6 - 2023-24 season) 11/27/2021   Zoster (Shingles) Vaccine (1 of 2) 12/19/2022*   Pneumonia Vaccine (1 of 2 - PCV) 03/10/2023*   Medicare Annual Wellness Visit  06/18/2023   Colon Cancer Screening  09/16/2023   DTaP/Tdap/Td vaccine (3 - Td or Tdap) 01/28/2028   Flu Shot  Completed   Hepatitis C Screening: USPSTF Recommendation to screen - Ages 18-79 yo.  Completed   HPV Vaccine  Aged Out  *Topic was postponed. The date shown is not the original due date.

## 2022-06-25 DIAGNOSIS — C8100 Nodular lymphocyte predominant Hodgkin lymphoma, unspecified site: Secondary | ICD-10-CM | POA: Diagnosis not present

## 2022-06-25 DIAGNOSIS — Z9089 Acquired absence of other organs: Secondary | ICD-10-CM | POA: Diagnosis not present

## 2022-06-25 DIAGNOSIS — Z79899 Other long term (current) drug therapy: Secondary | ICD-10-CM | POA: Diagnosis not present

## 2022-06-25 DIAGNOSIS — Z888 Allergy status to other drugs, medicaments and biological substances status: Secondary | ICD-10-CM | POA: Diagnosis not present

## 2022-06-25 DIAGNOSIS — R918 Other nonspecific abnormal finding of lung field: Secondary | ICD-10-CM | POA: Diagnosis not present

## 2022-06-25 DIAGNOSIS — Z87891 Personal history of nicotine dependence: Secondary | ICD-10-CM | POA: Diagnosis not present

## 2022-06-25 DIAGNOSIS — D4989 Neoplasm of unspecified behavior of other specified sites: Secondary | ICD-10-CM | POA: Diagnosis not present

## 2022-06-25 DIAGNOSIS — J9859 Other diseases of mediastinum, not elsewhere classified: Secondary | ICD-10-CM | POA: Diagnosis not present

## 2022-06-25 DIAGNOSIS — R911 Solitary pulmonary nodule: Secondary | ICD-10-CM | POA: Diagnosis not present

## 2022-06-25 DIAGNOSIS — C37 Malignant neoplasm of thymus: Secondary | ICD-10-CM | POA: Diagnosis not present

## 2022-06-25 DIAGNOSIS — Z510 Encounter for antineoplastic radiation therapy: Secondary | ICD-10-CM | POA: Diagnosis not present

## 2022-07-08 DIAGNOSIS — D225 Melanocytic nevi of trunk: Secondary | ICD-10-CM | POA: Diagnosis not present

## 2022-07-08 DIAGNOSIS — L298 Other pruritus: Secondary | ICD-10-CM | POA: Diagnosis not present

## 2022-07-08 DIAGNOSIS — L820 Inflamed seborrheic keratosis: Secondary | ICD-10-CM | POA: Diagnosis not present

## 2022-07-08 DIAGNOSIS — L538 Other specified erythematous conditions: Secondary | ICD-10-CM | POA: Diagnosis not present

## 2022-07-08 DIAGNOSIS — L814 Other melanin hyperpigmentation: Secondary | ICD-10-CM | POA: Diagnosis not present

## 2022-07-08 DIAGNOSIS — L821 Other seborrheic keratosis: Secondary | ICD-10-CM | POA: Diagnosis not present

## 2022-07-08 DIAGNOSIS — L408 Other psoriasis: Secondary | ICD-10-CM | POA: Diagnosis not present

## 2022-07-14 ENCOUNTER — Other Ambulatory Visit: Payer: Self-pay

## 2022-07-14 DIAGNOSIS — C8100 Nodular lymphocyte predominant Hodgkin lymphoma, unspecified site: Secondary | ICD-10-CM | POA: Diagnosis not present

## 2022-07-14 DIAGNOSIS — C8108 Nodular lymphocyte predominant Hodgkin lymphoma, lymph nodes of multiple sites: Secondary | ICD-10-CM | POA: Diagnosis not present

## 2022-07-14 DIAGNOSIS — Z79899 Other long term (current) drug therapy: Secondary | ICD-10-CM | POA: Diagnosis not present

## 2022-07-14 DIAGNOSIS — Z5181 Encounter for therapeutic drug level monitoring: Secondary | ICD-10-CM | POA: Diagnosis not present

## 2022-07-16 ENCOUNTER — Ambulatory Visit (INDEPENDENT_AMBULATORY_CARE_PROVIDER_SITE_OTHER): Payer: Medicare Other | Admitting: Neurology

## 2022-07-16 DIAGNOSIS — G43709 Chronic migraine without aura, not intractable, without status migrainosus: Secondary | ICD-10-CM

## 2022-07-16 MED ORDER — ONABOTULINUMTOXINA 100 UNITS IJ SOLR
200.0000 [IU] | Freq: Once | INTRAMUSCULAR | Status: AC
Start: 2022-07-16 — End: 2022-07-16
  Administered 2022-07-16: 155 [IU] via INTRAMUSCULAR

## 2022-07-16 NOTE — Progress Notes (Signed)
[facility]  ° °Procedure Note Botox ° °Attending: Dr. Christana Angelica ° °Preoperative Diagnosis(es): Chronic migraine ° °Consent obtained from: The patient °Benefits discussed included, but were not limited to decreased muscle tightness, increased joint range of motion, and decreased pain.  Risk discussed included, but were not limited pain and discomfort, bleeding, bruising, excessive weakness, venous thrombosis, muscle atrophy and dysphagia.  Anticipated outcomes of the procedure as well as he risks and benefits of the alternatives to the procedure, and the roles and tasks of the personnel to be involved, were discussed with the patient, and the patient consents to the procedure and agrees to proceed. A copy of the patient medication guide was given to the patient which explains the blackbox warning. ° °Patients identity and treatment sites confirmed Yes.  . ° °Details of Procedure: °Skin was cleaned with alcohol. Prior to injection, the needle plunger was aspirated to make sure the needle was not within a blood vessel.  There was no blood retrieved on aspiration.   ° °Following is a summary of the muscles injected  And the amount of Botulinum toxin used: ° °Dilution °200 units of Botox was reconstituted with 4 ml of preservative free normal saline. °Time of reconstitution: At the time of the office visit (<30 minutes prior to injection)  ° °Injections  °155 total units of Botox was injected with a 30 gauge needle. ° °Injection Sites: °L occipitalis: 15 units- 3 sites  °R occiptalis: 15 units- 3 sites ° °L upper trapezius: 15 units- 3 sites °R upper trapezius: 15 units- 3 sits          °L paraspinal: 10 units- 2 sites °R paraspinal: 10 units- 2 sites ° °Face °L frontalis(2 injection sites):10 units   °R frontalis(2 injection sites):10 units         °L corrugator: 5 units   °R corrugator: 5 units           °Procerus: 5 units   °L temporalis: 20 units °R temporalis: 20 units  ° °Agent:  °200 units of botulinum Type  A (Onobotulinum Toxin type A) was reconstituted with 4 ml of preservative free normal saline.  °Time of reconstitution: At the time of the office visit (<30 minutes prior to injection)  ° ° ° Total injected (Units):  155 ° Total wasted (Units):  45 ° °Patient tolerated procedure well without complications.   °Reinjection is anticipated in 3 months. ° ° °

## 2022-07-25 IMAGING — CT NM PET TUM IMG INITIAL (PI) SKULL BASE T - THIGH
1 of 7 series · 3 of 25 positions shown · non-contrast
Comparison: 01/07/2021 CTA chest

CLINICAL DATA: Initial treatment strategy for mediastinal mass.

EXAM:
NUCLEAR MEDICINE PET SKULL BASE TO THIGH
TECHNIQUE: 13.7 mCi F-18 FDG was injected intravenously. Full-ring PET imaging
was performed from the skull base to thigh after the radiotracer. CT
data was obtained and used for attenuation correction and anatomic
localization.
Fasting blood glucose: 95 mg/dl

[Series 4: ct sk_thigh 5.0 bf37 · axial · 5.0mm · 0.98mm/px · z∈[-1266,-710]mm · 3 of 233 slices shown]
[im 47/233  brain]
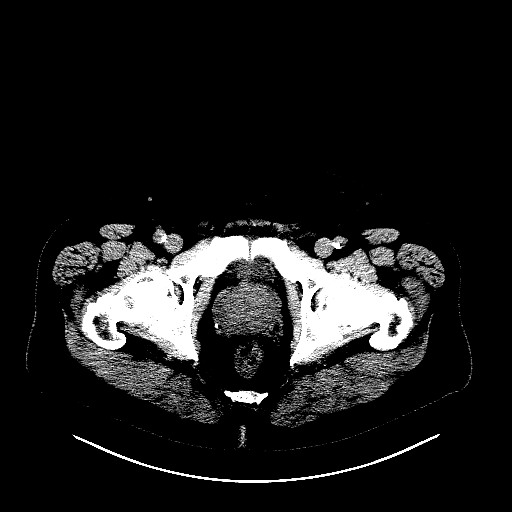
[im 93/233  brain]
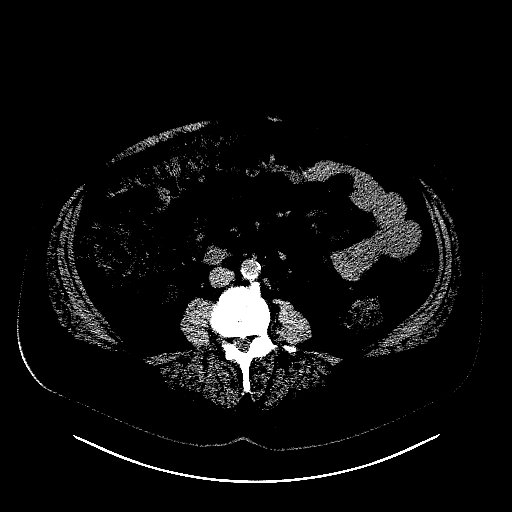
[im 186/233  brain]
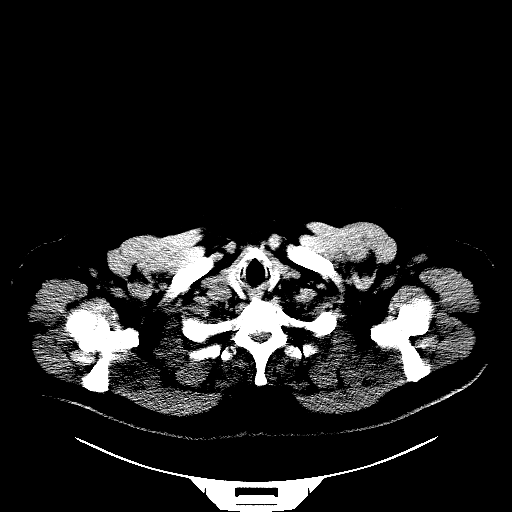

[3 of 25 positions shown; findings below may reference images not displayed]

FINDINGS: Mediastinal blood pool activity: SUV max

Liver activity: SUV max

NECK: No areas of abnormal hypermetabolism.

Incidental CT findings: No cervical adenopathy. Bilateral carotid
atherosclerosis.

CHEST: A node at the right thoracic inlet measures 1.4 cm a S.U.V.
max of 5.5 on 47/4.

The anterior mediastinal mass is hypermetabolic. Example 5.9 x
cm and a S.U.V. max of 7.3 on 66/4.

Small left axillary nodes are hypermetabolic. Example at a S.U.V.
max of 3.9 on 61/4.

Incidental CT findings: Small right and trace left pleural
effusions, new. Aortic and coronary artery calcification. Mild
cardiomegaly.

ABDOMEN/PELVIS: The medial splenic lesion is hypermetabolic. 4.1 cm
on prior contrast enhanced CT and a S.U.V. max of 13.9 on 106/4.

An aortocaval 7 mm node demonstrates equivocal hypermetabolism at a
S.U.V. max of 2.7 on 133/4.

Incidental CT findings: Deferred to recent diagnostic CT. Normal
adrenal glands. Abdominal aortic atherosclerosis. Subcentimeter
right hepatic lobe cyst. Moderate prostatomegaly.

SKELETON: No abnormal marrow activity.

Incidental CT findings: none
IMPRESSION: 1. The anterior mediastinal and medial splenic masses are both
hypermetabolic, most consistent with lymphoma. ([HOSPITAL]) 5
2. Thoracic and equivocal abdominal nodal involvement as detailed
above.
3. New small right and trace left pleural effusions since
01/07/2021.
[DATE]. Incidental findings, including: Coronary artery atherosclerosis.
Aortic Atherosclerosis ([id number]). Prostatomegaly.

## 2022-07-28 IMAGING — CR DG CHEST 2V
2 series · 2 of 2 positions shown · non-contrast
Comparison: 01/07/2021

CLINICAL DATA: Pre-admission. Patient for supraclavicular
adenopathy biopsy.

EXAM:
CHEST - 2 VIEW

[w chest pa]
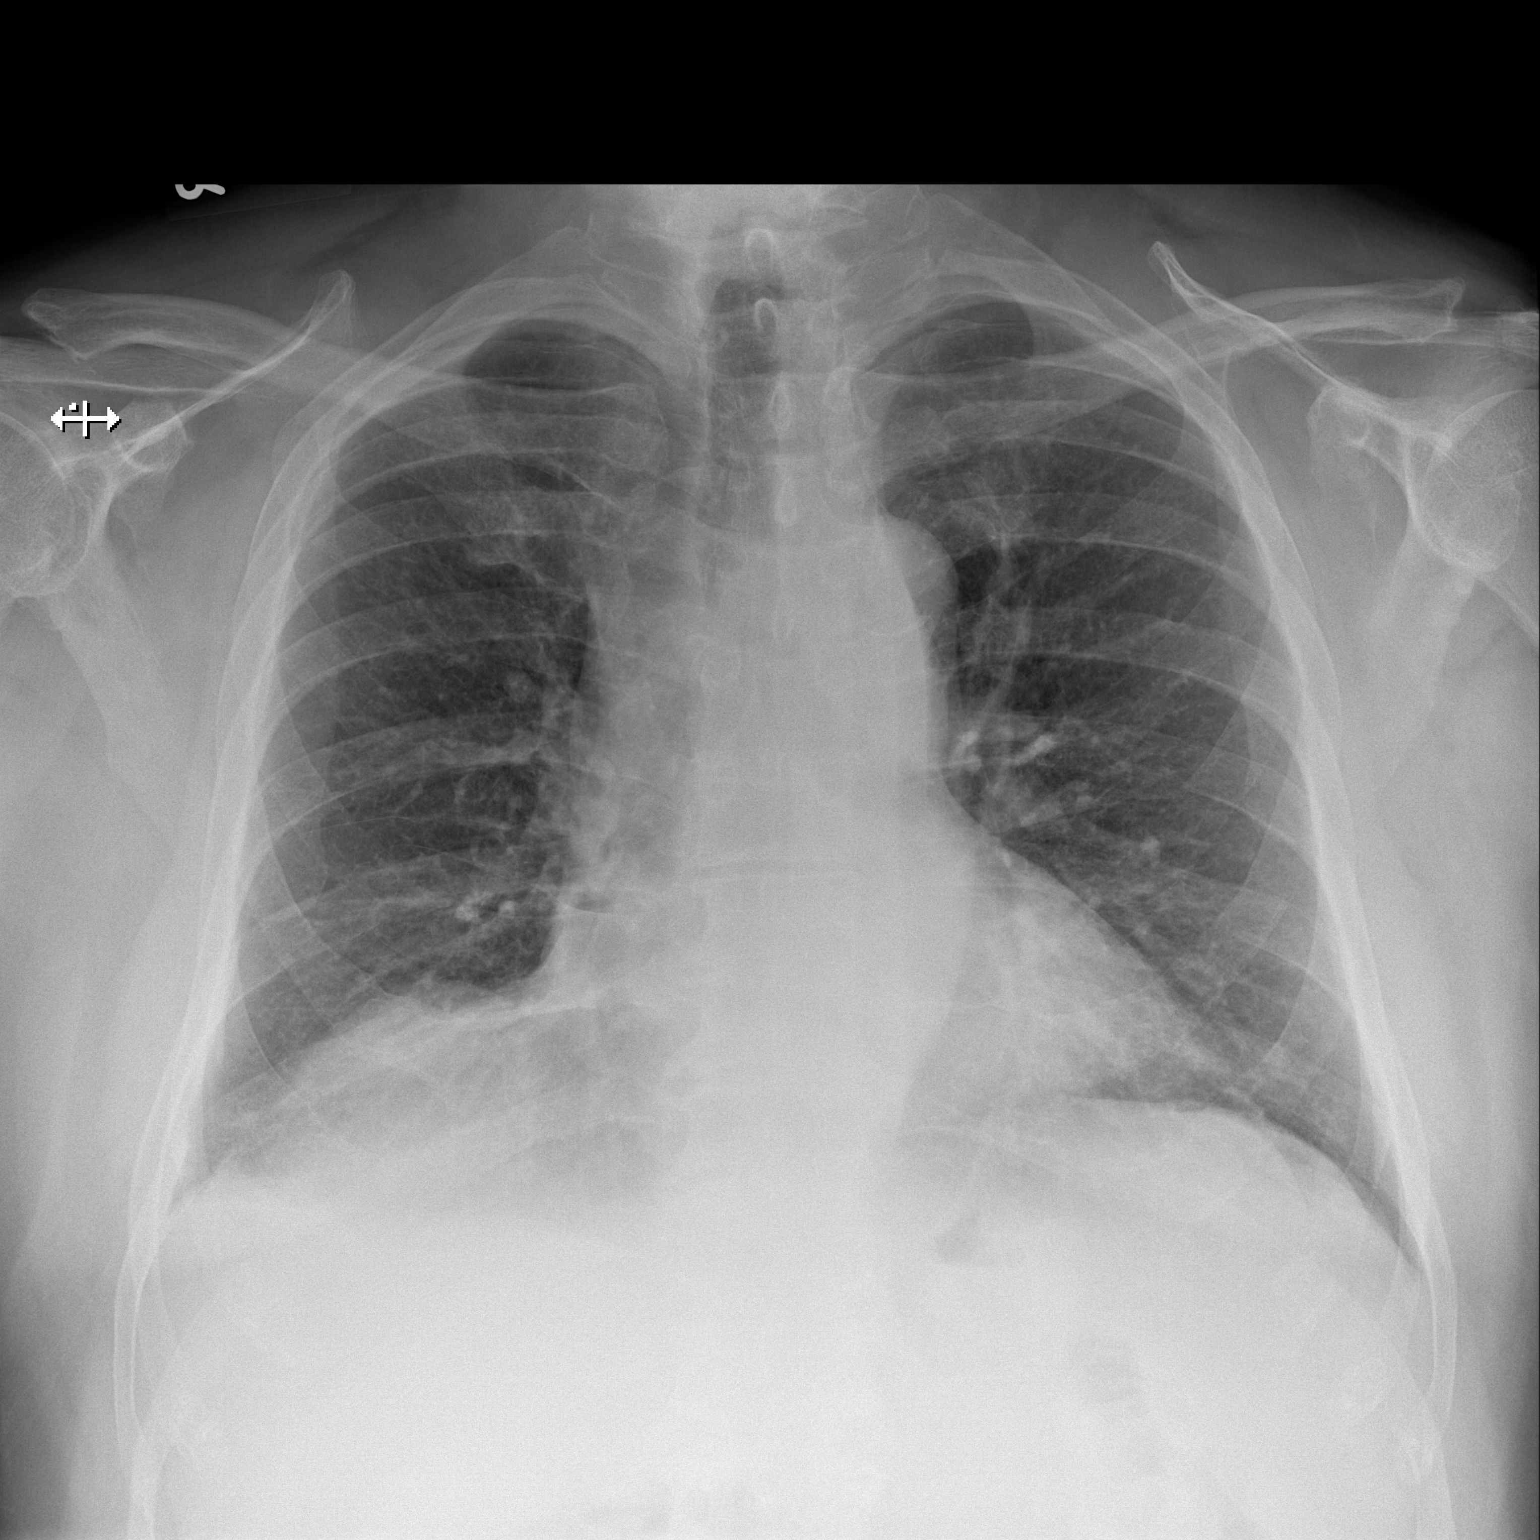

[w chest lat]
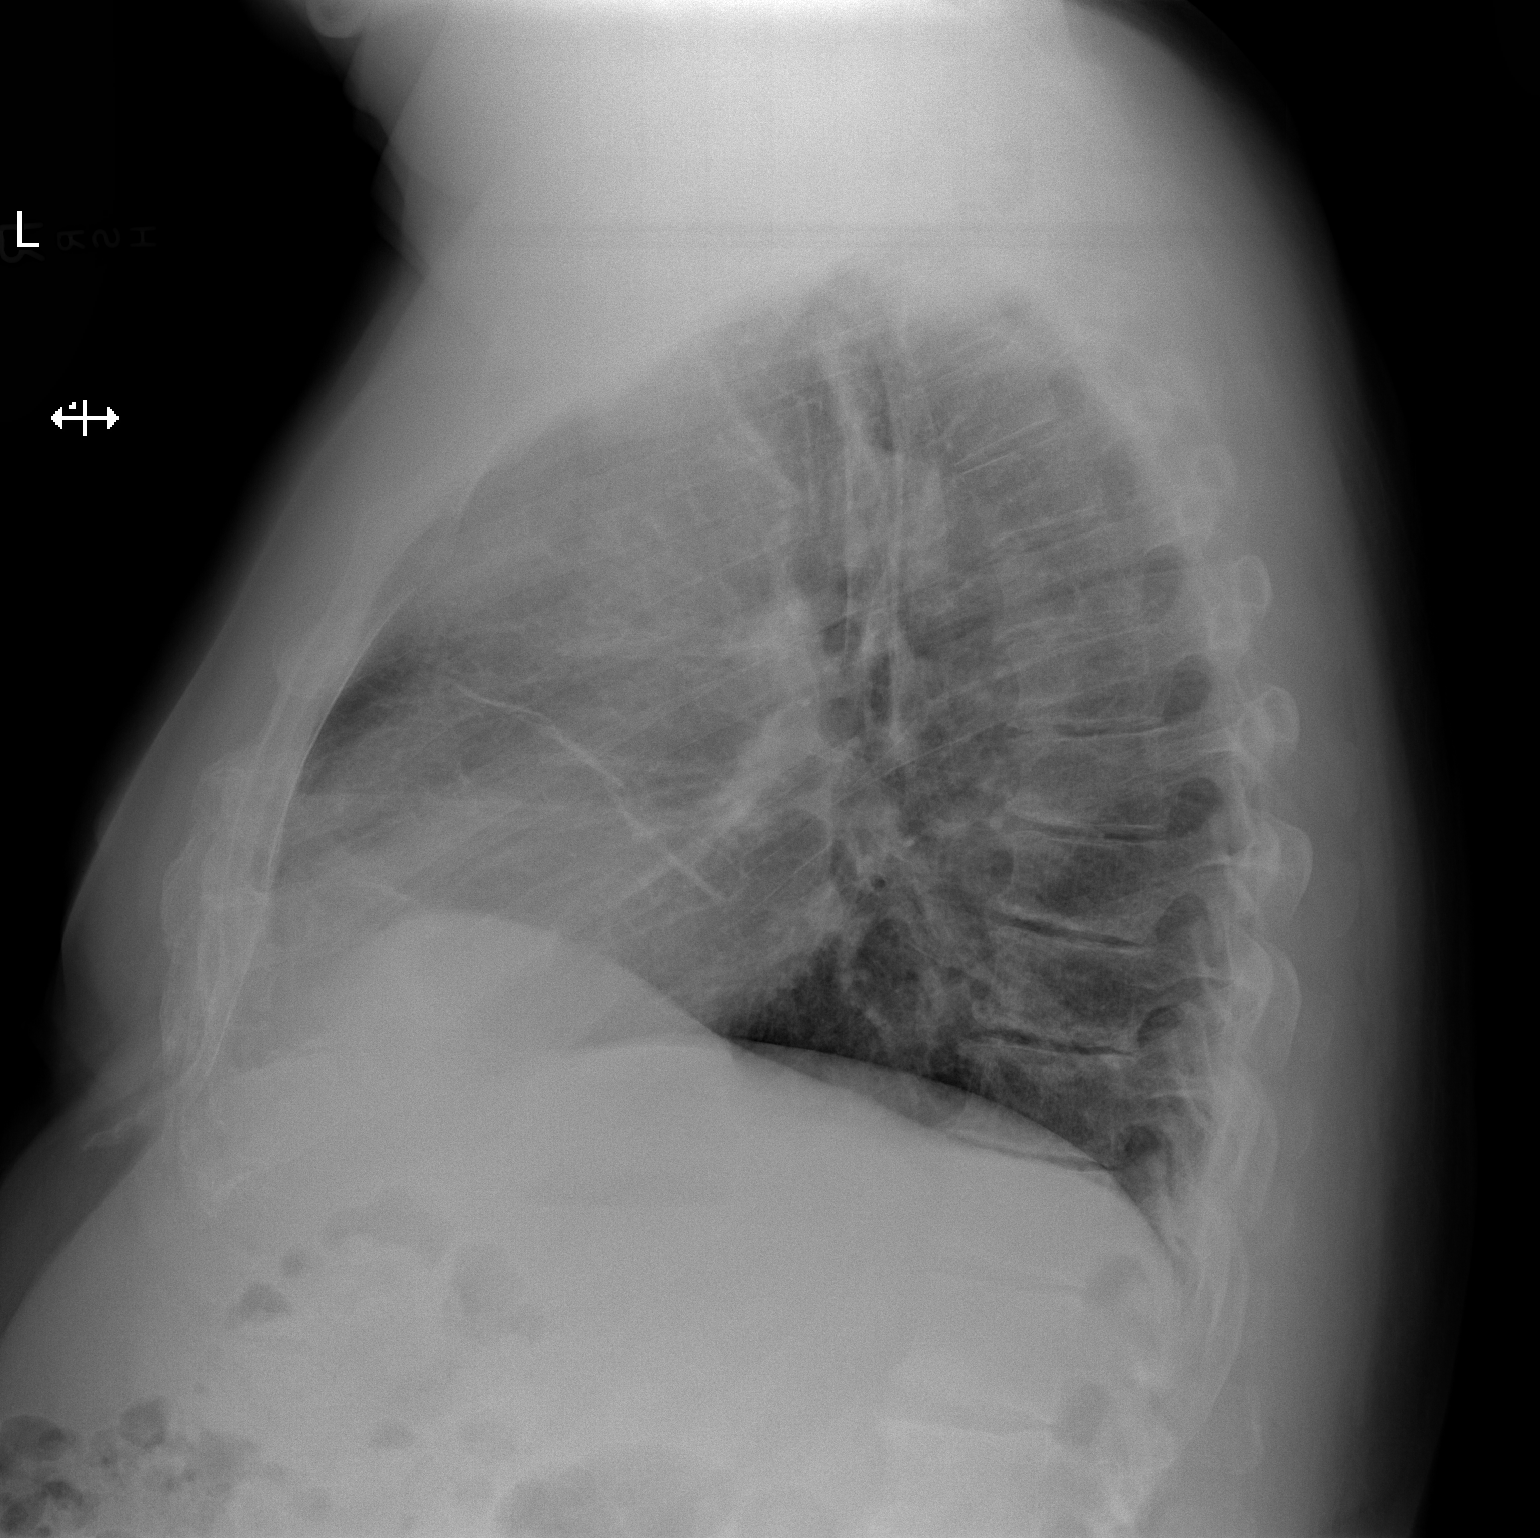

[2 of 2 positions shown; findings below may reference images not displayed]

FINDINGS: Cardiac silhouette is normal in size. No mediastinal or hilar
masses. No evidence of adenopathy.

Linear opacity noted in the right middle lobe consistent with
atelectasis. Lungs are otherwise clear.

No pleural effusion or pneumothorax.

Skeletal structures are intact.
IMPRESSION: No active cardiopulmonary disease.

## 2022-08-25 DIAGNOSIS — K219 Gastro-esophageal reflux disease without esophagitis: Secondary | ICD-10-CM | POA: Diagnosis not present

## 2022-08-25 DIAGNOSIS — R09A2 Foreign body sensation, throat: Secondary | ICD-10-CM | POA: Diagnosis not present

## 2022-08-25 DIAGNOSIS — H903 Sensorineural hearing loss, bilateral: Secondary | ICD-10-CM | POA: Diagnosis not present

## 2022-08-25 DIAGNOSIS — H9313 Tinnitus, bilateral: Secondary | ICD-10-CM | POA: Diagnosis not present

## 2022-08-26 ENCOUNTER — Telehealth: Payer: Self-pay | Admitting: Cardiology

## 2022-08-26 DIAGNOSIS — Z9221 Personal history of antineoplastic chemotherapy: Secondary | ICD-10-CM

## 2022-08-26 DIAGNOSIS — Z79899 Other long term (current) drug therapy: Secondary | ICD-10-CM

## 2022-08-26 NOTE — Telephone Encounter (Signed)
Pt stated [facility] sent over a ECHO order for him on 4/17 to Thea Alken requesting an appt be set up. He would like a callback to discuss further or he'd like the [facility] Oncology office to be contacted at [phone] and fax [phone] regarding the order. Please advise

## 2022-08-26 NOTE — Telephone Encounter (Signed)
Did not see paperwork he is referring. Patient has the information and will try to upload to My chart and if unable, will bring to the office.

## 2022-08-27 ENCOUNTER — Encounter (HOSPITAL_COMMUNITY): Payer: Self-pay | Admitting: Nurse Practitioner

## 2022-08-27 DIAGNOSIS — C8100 Nodular lymphocyte predominant Hodgkin lymphoma, unspecified site: Secondary | ICD-10-CM

## 2022-08-27 DIAGNOSIS — Z5181 Encounter for therapeutic drug level monitoring: Secondary | ICD-10-CM

## 2022-08-27 NOTE — Telephone Encounter (Signed)
Left message for pt, order received and faxed to the echo department for scheduling.

## 2022-08-27 NOTE — Addendum Note (Signed)
Addended by: Scheryl Marten on: 08/27/2022 10:32 AM   Modules accepted: Orders

## 2022-08-30 ENCOUNTER — Encounter: Payer: Self-pay | Admitting: Internal Medicine

## 2022-08-30 ENCOUNTER — Ambulatory Visit (INDEPENDENT_AMBULATORY_CARE_PROVIDER_SITE_OTHER): Payer: Medicare Other | Admitting: Internal Medicine

## 2022-08-30 ENCOUNTER — Ambulatory Visit (INDEPENDENT_AMBULATORY_CARE_PROVIDER_SITE_OTHER): Payer: Medicare Other

## 2022-08-30 VITALS — BP 120/82 | HR 74 | Temp 98.3°F | Ht 71.0 in | Wt 279.0 lb

## 2022-08-30 DIAGNOSIS — C8108 Nodular lymphocyte predominant Hodgkin lymphoma, lymph nodes of multiple sites: Secondary | ICD-10-CM

## 2022-08-30 DIAGNOSIS — R200 Anesthesia of skin: Secondary | ICD-10-CM | POA: Diagnosis not present

## 2022-08-30 DIAGNOSIS — E669 Obesity, unspecified: Secondary | ICD-10-CM

## 2022-08-30 DIAGNOSIS — R079 Chest pain, unspecified: Secondary | ICD-10-CM | POA: Diagnosis not present

## 2022-08-30 DIAGNOSIS — E559 Vitamin D deficiency, unspecified: Secondary | ICD-10-CM | POA: Diagnosis not present

## 2022-08-30 DIAGNOSIS — R7301 Impaired fasting glucose: Secondary | ICD-10-CM

## 2022-08-30 DIAGNOSIS — R202 Paresthesia of skin: Secondary | ICD-10-CM | POA: Diagnosis not present

## 2022-08-30 LAB — LIPID PANEL
Cholesterol: 153 mg/dL (ref 0–200)
HDL: 33.7 mg/dL — ABNORMAL LOW (ref >39.00)
NonHDL: 118.94
Total CHOL/HDL Ratio: 5
Triglycerides: 201 mg/dL — ABNORMAL HIGH (ref 0.0–149.0)
VLDL: 40.2 mg/dL — ABNORMAL HIGH (ref 0.0–40.0)

## 2022-08-30 LAB — HEMOGLOBIN A1C: Hgb A1c MFr Bld: 5.2 % (ref 4.6–6.5)

## 2022-08-30 LAB — LDL CHOLESTEROL, DIRECT: Direct LDL: 99 mg/dL

## 2022-08-30 LAB — TSH: TSH: 4.7 u[IU]/mL (ref 0.35–5.50)

## 2022-08-30 NOTE — Assessment & Plan Note (Signed)
This does not sound related to recent cancer treatment. Recent CT scan with good results of treatment. Due for CT scan Oct 2024.

## 2022-08-30 NOTE — Assessment & Plan Note (Signed)
Checking TSH, HgA1c, B12, vitamin D to assess for metabolic causes. Could be related to recent fall with some nerve irritation in the cervical spine.

## 2022-08-30 NOTE — Assessment & Plan Note (Signed)
Checking CXR today to rule out structural change. Suspect inflammation due to fall about 2 weeks ago.

## 2022-08-30 NOTE — Progress Notes (Signed)
   Subjective:   Patient ID: Michael Conway, male    DOB: March 30, 1950, 72 y.o.   MRN: [id number]  HPI The patient is a 72 YO man coming in for right chest tenderness at location of prior port. With some swelling. Fall about 2 weeks ago on back. Also having some tingling down left hand. Overall not worsening. Last CT scan March 2024 with good results from chemo.   Review of Systems  Constitutional: Negative.   HENT: Negative.    Eyes: Negative.   Respiratory:  Negative for cough, chest tightness and shortness of breath.   Cardiovascular:  Positive for chest pain. Negative for palpitations and leg swelling.  Gastrointestinal:  Negative for abdominal distention, abdominal pain, constipation, diarrhea, nausea and vomiting.  Musculoskeletal: Negative.   Skin: Negative.   Neurological:  Positive for numbness.  Psychiatric/Behavioral: Negative.      Objective:  Physical Exam Constitutional:      Appearance: He is well-developed.  HENT:     Head: Normocephalic and atraumatic.  Cardiovascular:     Rate and Rhythm: Normal rate and regular rhythm.  Pulmonary:     Effort: Pulmonary effort is normal. No respiratory distress.     Breath sounds: Normal breath sounds. No wheezing or rales.     Comments: Slight prominence right chest wall where prior port located. No fluctuance.  Abdominal:     General: Bowel sounds are normal. There is no distension.     Palpations: Abdomen is soft.     Tenderness: There is no abdominal tenderness. There is no rebound.  Musculoskeletal:     Cervical back: Normal range of motion.  Skin:    General: Skin is warm and dry.  Neurological:     Mental Status: He is alert and oriented to person, place, and time.     Coordination: Coordination normal.     Vitals:   08/30/22 1549  BP: 120/82  Pulse: 74  Temp: 98.3 F (36.8 C)  TempSrc: Oral  SpO2: 96%  Weight: 279 lb (126.6 kg)  Height: 5\' 11"  (1.803 m)    Assessment & Plan:

## 2022-08-31 LAB — VITAMIN B12: Vitamin B-12: 200 pg/mL — ABNORMAL LOW (ref 211–911)

## 2022-08-31 LAB — VITAMIN D 25 HYDROXY (VIT D DEFICIENCY, FRACTURES): VITD: 25.45 ng/mL — ABNORMAL LOW (ref 30.00–100.00)

## 2022-09-01 ENCOUNTER — Ambulatory Visit (HOSPITAL_COMMUNITY): Payer: Medicare Other

## 2022-09-02 ENCOUNTER — Encounter: Payer: Self-pay | Admitting: Internal Medicine

## 2022-09-03 ENCOUNTER — Ambulatory Visit (INDEPENDENT_AMBULATORY_CARE_PROVIDER_SITE_OTHER): Payer: Medicare Other

## 2022-09-03 DIAGNOSIS — E538 Deficiency of other specified B group vitamins: Secondary | ICD-10-CM

## 2022-09-03 MED ORDER — CYANOCOBALAMIN 1000 MCG/ML IJ SOLN
1000.0000 ug | Freq: Once | INTRAMUSCULAR | Status: AC
Start: 2022-09-03 — End: 2022-09-03
  Administered 2022-09-03: 1000 ug via INTRAMUSCULAR

## 2022-09-03 NOTE — Progress Notes (Cosign Needed Addendum)
Patient here for weekly B12 injection per Dr. Okey Dupre. B12 1000 mcg given in right arm IM and patient tolerated injection well today.   Medical screening examination/treatment/procedure(s) were performed by non-physician practitioner and as supervising physician I was immediately available for consultation/collaboration.  I agree with above. Jacinta Shoe, MD

## 2022-09-10 ENCOUNTER — Ambulatory Visit (INDEPENDENT_AMBULATORY_CARE_PROVIDER_SITE_OTHER): Payer: Medicare Other

## 2022-09-10 DIAGNOSIS — E538 Deficiency of other specified B group vitamins: Secondary | ICD-10-CM

## 2022-09-10 MED ORDER — CYANOCOBALAMIN 1000 MCG/ML IJ SOLN
1000.0000 ug | Freq: Once | INTRAMUSCULAR | Status: AC
Start: 2022-09-10 — End: 2022-09-10
  Administered 2022-09-10: 1000 ug via INTRAMUSCULAR

## 2022-09-10 NOTE — Progress Notes (Signed)
B12 given.  Pt tolerated well. Pt is aware to give the office a call for an side effects or reactions. Please co-sign.   
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
## 2022-09-15 ENCOUNTER — Encounter: Payer: Self-pay | Admitting: Neurology

## 2022-09-15 ENCOUNTER — Telehealth (INDEPENDENT_AMBULATORY_CARE_PROVIDER_SITE_OTHER): Payer: Medicare Other | Admitting: Neurology

## 2022-09-15 DIAGNOSIS — G4733 Obstructive sleep apnea (adult) (pediatric): Secondary | ICD-10-CM | POA: Diagnosis not present

## 2022-09-15 DIAGNOSIS — G43709 Chronic migraine without aura, not intractable, without status migrainosus: Secondary | ICD-10-CM | POA: Diagnosis not present

## 2022-09-15 DIAGNOSIS — G43809 Other migraine, not intractable, without status migrainosus: Secondary | ICD-10-CM | POA: Diagnosis not present

## 2022-09-15 DIAGNOSIS — H903 Sensorineural hearing loss, bilateral: Secondary | ICD-10-CM | POA: Diagnosis not present

## 2022-09-15 NOTE — Patient Instructions (Signed)
Continue Botox I would still like to also start one of the monthly self-injections.  Since it is too expensive with Medicare (and likely wouldn't be approved while also taking Botox), I would like for you to see if it can be covered through the [place].  The medication options are Emgality, Aimovig or Ajovy Must limit use of pain relievers (such as Advil, Aleve, Tylenol, etc) to no more than 2 days out of the week to prevent rebound headache Ideally I would like for you to discontinue all caffeine.  But at the very least, cut down to just one regular sized cup of coffee a day (for now) I will see you for the next scheduled Botox. Follow up with me for another virtual visit in one year.

## 2022-09-16 ENCOUNTER — Other Ambulatory Visit: Payer: Self-pay

## 2022-09-17 ENCOUNTER — Ambulatory Visit (INDEPENDENT_AMBULATORY_CARE_PROVIDER_SITE_OTHER): Payer: Medicare Other

## 2022-09-17 DIAGNOSIS — E538 Deficiency of other specified B group vitamins: Secondary | ICD-10-CM

## 2022-09-17 MED ORDER — CYANOCOBALAMIN 1000 MCG/ML IJ SOLN
1000.0000 ug | Freq: Once | INTRAMUSCULAR | Status: AC
Start: 2022-09-17 — End: 2022-09-17
  Administered 2022-09-17: 1000 ug via INTRAMUSCULAR

## 2022-09-17 NOTE — Progress Notes (Signed)
B12 given.  Pt tolerated well. Pt is aware to give the office a call for an side effects or reactions. Please co-sign.   

## 2022-09-20 ENCOUNTER — Encounter: Payer: Self-pay | Admitting: Neurology

## 2022-09-24 ENCOUNTER — Ambulatory Visit (INDEPENDENT_AMBULATORY_CARE_PROVIDER_SITE_OTHER): Payer: Medicare Other

## 2022-09-24 ENCOUNTER — Ambulatory Visit (HOSPITAL_COMMUNITY): Payer: Medicare Other | Attending: Internal Medicine

## 2022-09-24 DIAGNOSIS — Z79899 Other long term (current) drug therapy: Secondary | ICD-10-CM

## 2022-09-24 DIAGNOSIS — E538 Deficiency of other specified B group vitamins: Secondary | ICD-10-CM

## 2022-09-24 DIAGNOSIS — Z5181 Encounter for therapeutic drug level monitoring: Secondary | ICD-10-CM | POA: Insufficient documentation

## 2022-09-24 DIAGNOSIS — Z9221 Personal history of antineoplastic chemotherapy: Secondary | ICD-10-CM | POA: Diagnosis not present

## 2022-09-24 LAB — ECHOCARDIOGRAM COMPLETE
Area-P 1/2: 3.13 cm2
S' Lateral: 1.9 cm

## 2022-09-24 MED ORDER — CYANOCOBALAMIN 1000 MCG/ML IJ SOLN
1000.0000 ug | Freq: Once | INTRAMUSCULAR | Status: AC
Start: 2022-09-24 — End: 2022-09-24
  Administered 2022-09-24: 1000 ug via INTRAMUSCULAR

## 2022-09-24 NOTE — Progress Notes (Signed)
Pt here for monthly B12 injection per   B12 1000mcg given IM and pt tolerated injection well.   

## 2022-10-15 ENCOUNTER — Ambulatory Visit (INDEPENDENT_AMBULATORY_CARE_PROVIDER_SITE_OTHER): Payer: Medicare Other | Admitting: Neurology

## 2022-10-15 DIAGNOSIS — G43709 Chronic migraine without aura, not intractable, without status migrainosus: Secondary | ICD-10-CM

## 2022-10-15 MED ORDER — ONABOTULINUMTOXINA 100 UNITS IJ SOLR
200.0000 [IU] | Freq: Once | INTRAMUSCULAR | Status: AC
Start: 2022-10-15 — End: 2022-10-15
  Administered 2022-10-15: 155 [IU] via INTRAMUSCULAR

## 2022-10-15 NOTE — Progress Notes (Signed)

## 2022-12-30 ENCOUNTER — Encounter: Payer: Self-pay | Admitting: Internal Medicine

## 2022-12-30 DIAGNOSIS — Z9089 Acquired absence of other organs: Secondary | ICD-10-CM | POA: Diagnosis not present

## 2022-12-30 DIAGNOSIS — C8100 Nodular lymphocyte predominant Hodgkin lymphoma, unspecified site: Secondary | ICD-10-CM | POA: Diagnosis not present

## 2022-12-30 DIAGNOSIS — Z888 Allergy status to other drugs, medicaments and biological substances status: Secondary | ICD-10-CM | POA: Diagnosis not present

## 2022-12-30 DIAGNOSIS — R5383 Other fatigue: Secondary | ICD-10-CM | POA: Diagnosis not present

## 2022-12-30 DIAGNOSIS — C8590 Non-Hodgkin lymphoma, unspecified, unspecified site: Secondary | ICD-10-CM | POA: Diagnosis not present

## 2022-12-30 DIAGNOSIS — R0602 Shortness of breath: Secondary | ICD-10-CM | POA: Diagnosis not present

## 2022-12-30 DIAGNOSIS — C37 Malignant neoplasm of thymus: Secondary | ICD-10-CM | POA: Diagnosis not present

## 2022-12-30 DIAGNOSIS — C801 Malignant (primary) neoplasm, unspecified: Secondary | ICD-10-CM | POA: Diagnosis not present

## 2022-12-30 DIAGNOSIS — D4989 Neoplasm of unspecified behavior of other specified sites: Secondary | ICD-10-CM | POA: Diagnosis not present

## 2022-12-30 DIAGNOSIS — Z510 Encounter for antineoplastic radiation therapy: Secondary | ICD-10-CM | POA: Diagnosis not present

## 2022-12-30 DIAGNOSIS — E039 Hypothyroidism, unspecified: Secondary | ICD-10-CM

## 2022-12-30 DIAGNOSIS — Z79899 Other long term (current) drug therapy: Secondary | ICD-10-CM | POA: Diagnosis not present

## 2022-12-30 DIAGNOSIS — Z87891 Personal history of nicotine dependence: Secondary | ICD-10-CM | POA: Diagnosis not present

## 2022-12-30 DIAGNOSIS — R918 Other nonspecific abnormal finding of lung field: Secondary | ICD-10-CM | POA: Diagnosis not present

## 2022-12-31 DIAGNOSIS — C8100 Nodular lymphocyte predominant Hodgkin lymphoma, unspecified site: Secondary | ICD-10-CM | POA: Diagnosis not present

## 2022-12-31 DIAGNOSIS — R0602 Shortness of breath: Secondary | ICD-10-CM | POA: Diagnosis not present

## 2022-12-31 DIAGNOSIS — C8190 Hodgkin lymphoma, unspecified, unspecified site: Secondary | ICD-10-CM | POA: Diagnosis not present

## 2023-01-03 DIAGNOSIS — E039 Hypothyroidism, unspecified: Secondary | ICD-10-CM | POA: Insufficient documentation

## 2023-01-07 DIAGNOSIS — D225 Melanocytic nevi of trunk: Secondary | ICD-10-CM | POA: Diagnosis not present

## 2023-01-07 DIAGNOSIS — R09A2 Foreign body sensation, throat: Secondary | ICD-10-CM | POA: Diagnosis not present

## 2023-01-07 DIAGNOSIS — C8102 Nodular lymphocyte predominant Hodgkin lymphoma, intrathoracic lymph nodes: Secondary | ICD-10-CM | POA: Diagnosis not present

## 2023-01-07 DIAGNOSIS — C37 Malignant neoplasm of thymus: Secondary | ICD-10-CM | POA: Diagnosis not present

## 2023-01-07 DIAGNOSIS — L408 Other psoriasis: Secondary | ICD-10-CM | POA: Diagnosis not present

## 2023-01-07 DIAGNOSIS — L814 Other melanin hyperpigmentation: Secondary | ICD-10-CM | POA: Diagnosis not present

## 2023-01-07 DIAGNOSIS — L309 Dermatitis, unspecified: Secondary | ICD-10-CM | POA: Diagnosis not present

## 2023-01-07 DIAGNOSIS — L821 Other seborrheic keratosis: Secondary | ICD-10-CM | POA: Diagnosis not present

## 2023-01-07 DIAGNOSIS — K219 Gastro-esophageal reflux disease without esophagitis: Secondary | ICD-10-CM | POA: Diagnosis not present

## 2023-01-14 ENCOUNTER — Ambulatory Visit (INDEPENDENT_AMBULATORY_CARE_PROVIDER_SITE_OTHER): Payer: Medicare Other | Admitting: Neurology

## 2023-01-14 DIAGNOSIS — G43709 Chronic migraine without aura, not intractable, without status migrainosus: Secondary | ICD-10-CM

## 2023-01-14 MED ORDER — ONABOTULINUMTOXINA 100 UNITS IJ SOLR
200.0000 [IU] | Freq: Once | INTRAMUSCULAR | Status: AC
Start: 2023-01-14 — End: 2023-01-14
  Administered 2023-01-14: 155 [IU] via INTRAMUSCULAR

## 2023-01-14 NOTE — Progress Notes (Signed)
Botulinum Clinic  ° °Procedure Note Botox ° °Attending: Dr. Kaydance Bowie ° °Preoperative Diagnosis(es): Chronic migraine ° °Consent obtained from: The patient °Benefits discussed included, but were not limited to decreased muscle tightness, increased joint range of motion, and decreased pain.  Risk discussed included, but were not limited pain and discomfort, bleeding, bruising, excessive weakness, venous thrombosis, muscle atrophy and dysphagia.  Anticipated outcomes of the procedure as well as he risks and benefits of the alternatives to the procedure, and the roles and tasks of the personnel to be involved, were discussed with the patient, and the patient consents to the procedure and agrees to proceed. A copy of the patient medication guide was given to the patient which explains the blackbox warning. ° °Patients identity and treatment sites confirmed Yes.  . ° °Details of Procedure: °Skin was cleaned with alcohol. Prior to injection, the needle plunger was aspirated to make sure the needle was not within a blood vessel.  There was no blood retrieved on aspiration.   ° °Following is a summary of the muscles injected  And the amount of Botulinum toxin used: ° °Dilution °200 units of Botox was reconstituted with 4 ml of preservative free normal saline. °Time of reconstitution: At the time of the office visit (<30 minutes prior to injection)  ° °Injections  °155 total units of Botox was injected with a 30 gauge needle. ° °Injection Sites: °L occipitalis: 15 units- 3 sites  °R occiptalis: 15 units- 3 sites ° °L upper trapezius: 15 units- 3 sites °R upper trapezius: 15 units- 3 sits          °L paraspinal: 10 units- 2 sites °R paraspinal: 10 units- 2 sites ° °Face °L frontalis(2 injection sites):10 units   °R frontalis(2 injection sites):10 units         °L corrugator: 5 units   °R corrugator: 5 units           °Procerus: 5 units   °L temporalis: 20 units °R temporalis: 20 units  ° °Agent:  °200 units of botulinum Type  A (Onobotulinum Toxin type A) was reconstituted with 4 ml of preservative free normal saline.  °Time of reconstitution: At the time of the office visit (<30 minutes prior to injection)  ° ° ° Total injected (Units):  155 ° Total wasted (Units):  45 ° °Patient tolerated procedure well without complications.   °Reinjection is anticipated in 3 months. ° ° °

## 2023-02-07 ENCOUNTER — Encounter: Payer: Self-pay | Admitting: Internal Medicine

## 2023-02-09 ENCOUNTER — Other Ambulatory Visit (INDEPENDENT_AMBULATORY_CARE_PROVIDER_SITE_OTHER): Payer: Medicare Other

## 2023-02-09 DIAGNOSIS — E039 Hypothyroidism, unspecified: Secondary | ICD-10-CM

## 2023-02-09 LAB — T4, FREE: Free T4: 0.68 ng/dL (ref 0.60–1.60)

## 2023-02-09 LAB — TSH: TSH: 3.84 u[IU]/mL (ref 0.35–5.50)

## 2023-02-10 ENCOUNTER — Encounter: Payer: Self-pay | Admitting: Internal Medicine

## 2023-02-10 DIAGNOSIS — E039 Hypothyroidism, unspecified: Secondary | ICD-10-CM

## 2023-02-10 NOTE — Telephone Encounter (Signed)
Please advise on medication increase

## 2023-02-11 MED ORDER — LEVOTHYROXINE SODIUM 75 MCG PO TABS: 75.0000 ug | ORAL_TABLET | Freq: Every day | ORAL | 3 refills | Status: DC

## 2023-02-17 DIAGNOSIS — K219 Gastro-esophageal reflux disease without esophagitis: Secondary | ICD-10-CM | POA: Diagnosis not present

## 2023-02-17 DIAGNOSIS — K209 Esophagitis, unspecified without bleeding: Secondary | ICD-10-CM | POA: Diagnosis not present

## 2023-02-17 DIAGNOSIS — G43719 Chronic migraine without aura, intractable, without status migrainosus: Secondary | ICD-10-CM | POA: Diagnosis not present

## 2023-02-17 DIAGNOSIS — C8102 Nodular lymphocyte predominant Hodgkin lymphoma, intrathoracic lymph nodes: Secondary | ICD-10-CM | POA: Diagnosis not present

## 2023-02-17 DIAGNOSIS — K2100 Gastro-esophageal reflux disease with esophagitis, without bleeding: Secondary | ICD-10-CM | POA: Diagnosis not present

## 2023-02-17 DIAGNOSIS — R09A2 Foreign body sensation, throat: Secondary | ICD-10-CM | POA: Diagnosis not present

## 2023-03-29 ENCOUNTER — Other Ambulatory Visit (HOSPITAL_COMMUNITY): Payer: Self-pay

## 2023-03-29 ENCOUNTER — Telehealth: Payer: Self-pay

## 2023-03-29 NOTE — Telephone Encounter (Signed)
PA needed or Botox

## 2023-03-29 NOTE — Telephone Encounter (Signed)
Received request: Will check BIV & run PA Thursday.

## 2023-04-11 ENCOUNTER — Other Ambulatory Visit (HOSPITAL_COMMUNITY): Payer: Self-pay

## 2023-04-11 ENCOUNTER — Encounter: Payer: Self-pay | Admitting: Hematology

## 2023-04-11 ENCOUNTER — Telehealth: Payer: Self-pay | Admitting: Pharmacy Technician

## 2023-04-11 NOTE — Telephone Encounter (Signed)
 Pharmacy Patient Advocate Encounter  [employer] verification has been submitted. Benefit Verification #:  [id number]  Pharmacy PA has been submitted for BOTOX  200u via CoverMyMeds. INSURANCE: [employer] DATE SUBMITTED: 1.13.25 KEY: [id number] Status is pending

## 2023-04-11 NOTE — Telephone Encounter (Signed)
 PA request has been Submitted. New Encounter created for follow up. For additional info see Pharmacy Prior Auth telephone encounter from 04/11/23.

## 2023-04-12 ENCOUNTER — Other Ambulatory Visit (HOSPITAL_COMMUNITY): Payer: Self-pay

## 2023-04-12 NOTE — Telephone Encounter (Signed)
 Pharmacy Patient Advocate Encounter  Received notification from [facility] that Prior Authorization for BOTOX 200 has been DENIED.  Full denial letter will be uploaded to the media tab. See denial reason below.   PA #/Case ID/Reference #: [id number]

## 2023-04-12 NOTE — Telephone Encounter (Signed)
 Tried calling patient, no answer.    LMOVM please call the office back per Dr. Everlena Cooper, As Mr. Grillo is scheduled for Botox on Friday, we can change it to a regular visit and discuss next step.

## 2023-04-15 ENCOUNTER — Encounter: Payer: Self-pay | Admitting: Neurology

## 2023-04-15 ENCOUNTER — Other Ambulatory Visit (INDEPENDENT_AMBULATORY_CARE_PROVIDER_SITE_OTHER): Payer: Medicare Other

## 2023-04-15 ENCOUNTER — Encounter: Payer: Self-pay | Admitting: Internal Medicine

## 2023-04-15 ENCOUNTER — Ambulatory Visit (INDEPENDENT_AMBULATORY_CARE_PROVIDER_SITE_OTHER): Payer: Medicare Other | Admitting: Neurology

## 2023-04-15 ENCOUNTER — Ambulatory Visit: Payer: Medicare Other | Admitting: Neurology

## 2023-04-15 VITALS — BP 124/70 | HR 85 | Ht 71.0 in | Wt 292.0 lb

## 2023-04-15 DIAGNOSIS — E039 Hypothyroidism, unspecified: Secondary | ICD-10-CM | POA: Diagnosis not present

## 2023-04-15 DIAGNOSIS — H903 Sensorineural hearing loss, bilateral: Secondary | ICD-10-CM

## 2023-04-15 DIAGNOSIS — H8193 Unspecified disorder of vestibular function, bilateral: Secondary | ICD-10-CM | POA: Diagnosis not present

## 2023-04-15 DIAGNOSIS — G43709 Chronic migraine without aura, not intractable, without status migrainosus: Secondary | ICD-10-CM

## 2023-04-15 LAB — TSH: TSH: 3.16 u[IU]/mL (ref 0.35–5.50)

## 2023-04-15 LAB — T4, FREE: Free T4: 0.82 ng/dL (ref 0.60–1.60)
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
## 2023-04-15 NOTE — Telephone Encounter (Signed)
I would like to appeal denial for Botox. They want him to try Depakote and beta blocker first. I prefer to avoid depakote because he already has hypothyroidism which has just been controlled. I would like to avoid beta blocker which may aggravate his chronic dizziness.

## 2023-04-26 ENCOUNTER — Telehealth: Payer: Self-pay | Admitting: Pharmacist

## 2023-04-26 NOTE — Telephone Encounter (Signed)
Information has been sent to clinical pharmacist for appeals review. It may take 5-7 days to prepare the necessary documentation to request the appeal from the insurance.

## 2023-04-26 NOTE — Telephone Encounter (Signed)
Appeal has been submitted for Botox. Will advise when response is received, please be advised that most companies may take 30 days to make a decision. Appeal letter and supporting documentation have been faxed to [phone] on 04/26/2023 @2 :15 pm.   Thank you, Dellie Burns, PharmD Clinical Pharmacist  [place]  Direct Dial: [phone]

## 2023-05-02 NOTE — Telephone Encounter (Signed)
Notified by [facility] that the appeal for Botox was approved.       Thank you, Dellie Burns, PharmD Clinical Pharmacist  [place]  Direct Dial: [phone]

## 2023-05-12 ENCOUNTER — Ambulatory Visit (INDEPENDENT_AMBULATORY_CARE_PROVIDER_SITE_OTHER): Payer: Medicare Other | Admitting: Neurology

## 2023-05-12 DIAGNOSIS — G43711 Chronic migraine without aura, intractable, with status migrainosus: Secondary | ICD-10-CM

## 2023-05-12 MED ORDER — ONABOTULINUMTOXINA 100 UNITS IJ SOLR
200.0000 [IU] | Freq: Once | INTRAMUSCULAR | Status: AC
  Administered 2023-05-12: 155 [IU] via INTRAMUSCULAR

## 2023-05-12 NOTE — Progress Notes (Signed)

## 2023-06-22 ENCOUNTER — Telehealth: Payer: Self-pay | Admitting: Neurology

## 2023-06-22 NOTE — Telephone Encounter (Signed)
 Patient left a message with the after hour service on 06-22-23 he wants to speak with someone about his botox charges

## 2023-06-23 ENCOUNTER — Other Ambulatory Visit (HOSPITAL_COMMUNITY): Payer: Self-pay

## 2023-06-23 DIAGNOSIS — Z888 Allergy status to other drugs, medicaments and biological substances status: Secondary | ICD-10-CM | POA: Diagnosis not present

## 2023-06-23 DIAGNOSIS — C8100 Nodular lymphocyte predominant Hodgkin lymphoma, unspecified site: Secondary | ICD-10-CM | POA: Diagnosis not present

## 2023-06-23 DIAGNOSIS — R918 Other nonspecific abnormal finding of lung field: Secondary | ICD-10-CM | POA: Diagnosis not present

## 2023-06-23 DIAGNOSIS — C37 Malignant neoplasm of thymus: Secondary | ICD-10-CM | POA: Diagnosis not present

## 2023-06-23 DIAGNOSIS — R911 Solitary pulmonary nodule: Secondary | ICD-10-CM | POA: Diagnosis not present

## 2023-06-23 DIAGNOSIS — Z87891 Personal history of nicotine dependence: Secondary | ICD-10-CM | POA: Diagnosis not present

## 2023-06-23 DIAGNOSIS — Z9089 Acquired absence of other organs: Secondary | ICD-10-CM | POA: Diagnosis not present

## 2023-06-23 DIAGNOSIS — Z510 Encounter for antineoplastic radiation therapy: Secondary | ICD-10-CM | POA: Diagnosis not present

## 2023-06-23 DIAGNOSIS — D4989 Neoplasm of unspecified behavior of other specified sites: Secondary | ICD-10-CM | POA: Diagnosis not present

## 2023-06-23 DIAGNOSIS — Z79899 Other long term (current) drug therapy: Secondary | ICD-10-CM | POA: Diagnosis not present

## 2023-06-23 NOTE — Telephone Encounter (Signed)
 Per patient he received a bill for his Botox visit and another he is unsure about.  After further review with the practice Administrator Rada Hay it looks like the Injection code balance $172, and the OV on 05/16/23 has a balance $84.  The Supplemental insurance is not cover the balance.   Will check into it.

## 2023-06-24 NOTE — Telephone Encounter (Signed)
 Patient has now met their deductible.

## 2023-07-08 DIAGNOSIS — C8100 Nodular lymphocyte predominant Hodgkin lymphoma, unspecified site: Secondary | ICD-10-CM | POA: Diagnosis not present

## 2023-07-08 DIAGNOSIS — E039 Hypothyroidism, unspecified: Secondary | ICD-10-CM | POA: Diagnosis not present

## 2023-07-15 DIAGNOSIS — L538 Other specified erythematous conditions: Secondary | ICD-10-CM | POA: Diagnosis not present

## 2023-07-15 DIAGNOSIS — L408 Other psoriasis: Secondary | ICD-10-CM | POA: Diagnosis not present

## 2023-07-15 DIAGNOSIS — L821 Other seborrheic keratosis: Secondary | ICD-10-CM | POA: Diagnosis not present

## 2023-07-15 DIAGNOSIS — D225 Melanocytic nevi of trunk: Secondary | ICD-10-CM | POA: Diagnosis not present

## 2023-07-15 DIAGNOSIS — L820 Inflamed seborrheic keratosis: Secondary | ICD-10-CM | POA: Diagnosis not present

## 2023-07-15 DIAGNOSIS — L2989 Other pruritus: Secondary | ICD-10-CM | POA: Diagnosis not present

## 2023-07-15 DIAGNOSIS — L814 Other melanin hyperpigmentation: Secondary | ICD-10-CM | POA: Diagnosis not present

## 2023-07-22 ENCOUNTER — Ambulatory Visit: Payer: Medicare Other | Admitting: Neurology

## 2023-07-27 ENCOUNTER — Encounter: Payer: Self-pay | Admitting: Gastroenterology

## 2023-07-28 ENCOUNTER — Other Ambulatory Visit: Payer: Self-pay

## 2023-08-09 ENCOUNTER — Other Ambulatory Visit: Payer: Self-pay

## 2023-08-12 ENCOUNTER — Ambulatory Visit (INDEPENDENT_AMBULATORY_CARE_PROVIDER_SITE_OTHER): Payer: Medicare Other | Admitting: Neurology

## 2023-08-12 DIAGNOSIS — G43709 Chronic migraine without aura, not intractable, without status migrainosus: Secondary | ICD-10-CM | POA: Diagnosis not present

## 2023-08-12 MED ORDER — ONABOTULINUMTOXINA 100 UNITS IJ SOLR
200.0000 [IU] | Freq: Once | INTRAMUSCULAR | Status: AC
Start: 2023-08-12 — End: 2023-08-12
  Administered 2023-08-12: 155 [IU] via INTRAMUSCULAR

## 2023-08-12 NOTE — Progress Notes (Signed)

## 2023-08-30 ENCOUNTER — Encounter: Payer: Self-pay | Admitting: Hematology

## 2023-09-02 ENCOUNTER — Ambulatory Visit: Admitting: *Deleted

## 2023-09-02 VITALS — Ht 71.0 in | Wt 278.0 lb

## 2023-09-02 DIAGNOSIS — Z860100 Personal history of colon polyps, unspecified: Secondary | ICD-10-CM

## 2023-09-02 MED ORDER — NA SULFATE-K SULFATE-MG SULF 17.5-3.13-1.6 GM/177ML PO SOLN
1.0000 | Freq: Once | ORAL | 0 refills | Status: AC
Start: 2023-09-02 — End: 2023-09-02

## 2023-09-02 NOTE — Progress Notes (Signed)
  Pre Visit completed over telephone.  Instructions   No egg or soy allergy known to patient  No issues known to pt with past sedation with any surgeries or procedures Patient denies ever being told they had issues or difficulty with intubation  No FH of Malignant Hyperthermia Pt is not on diet pills Pt is not on  home 02  Pt is not on blood thinners  Pt denies issues with constipation  No A fib or A flutter Have any cardiac testing pending-- no Pt instructed to use [email or web address] or GoodRx for a price reduction on prep

## 2023-09-13 NOTE — Progress Notes (Signed)
 Virtual Visit via Video Note  Consent was obtained for video visit:  Yes.   Answered questions that patient had about telehealth interaction:  Yes.   I discussed the limitations, risks, security and privacy concerns of performing an evaluation and management service by telemedicine. I also discussed with the patient that there may be a patient responsible charge related to this service. The patient expressed understanding and agreed to proceed.  Pt location: Home Physician Location: office Name of referring provider:  Adelia Homestead, * I connected with Michael Conway at patients initiation/request on 09/15/2023 at  9:50 AM EDT by video enabled telemedicine application and verified that I am speaking with the correct person using two identifiers. Pt MRN:  [id number] Pt DOB:  [birth year] Video Participants:  Michael Conway  Assessment and Plan:   Chronic migraine without aura, without status migrainosus, not intractabe - significantly improved on Botox  (greater than 50% reduction of moderate-severe headaches per month).  Vestibular migraine Left vestibular hypoperfusion Sensorineural hearing loss OSA   Migraine prevention:  I would like to continue him on Botox .  He has had significant improvement (greater than 50% reduction in moderate-severe headaches per month).  I would rather avoid divalproex as he has hypothyroidism which has just recently gotten under control.  I would prefer to avoid a beta blocker as it may aggravate his chronic dizziness. Migraine rescue:  Advil or Aleve Limit use of pain relievers to no more than 2 days out of week to prevent risk of rebound or medication-overuse headache and to help reduce GERD. Discussed cutting back on caffeine.  Ideally complete cessation.  He will start with cutting out the colas and decrease coffee to one regular size mug a day.   Keep headache diary Management for OSA Follow up for next Botox  Follow up for routine visit in  one year   Total time in chart and face to face with patient:  23 minutes. History of Present Illness:  Michael Conway is a 73 year old right-handed male with history of Hodgkin's lymphoma in remission and thymoma s/p resection and radiation who follows up for migraines and vertigo   UPDATE: Significant migraines: 5-10/10, 10-11 days a month (down from daily before Botox ), lasted 30-60 minutes on average, sometimes several hours.   He does have a lingering daily dull headache. Vertigo:  Daily Tinnitus:  daily   The problem is that his insurance is no longer covering the Botox .  They request that he try divalproex and a beta blocker.   Current acute therapy:  Aleve, Advil Other current therapy:  Botox , acupuncture Current antidepressant:  Lexapro 10mg  daily Current vitamins/supplements:  Magnesium 500mg , Zinc, D, folic acid   Coffee:  Daily Exercise:  Walk Sleep hygiene:  Diagnosed with moderate OSA.  On CPAP but struggles with it.  Depression:  yes   HISTORY:  Since the 82s. he has migraines and progressive bilateral sensorineural hearing loss with tinnitus of probable autoimmune etiology.  Symptoms worse after exposure to loud explosions [place] a shooting range in July 2019.  Repeated audiometric testing has demonstrated progressive high frequency sensorineural hearing loss with continued loss of discrimination in both ears.  Autoimmune workup, including ANA, Sed Rate, RF, complement protein levels, HLA-B27, anitphospholipid antibodies, 68 Kd antibody titers, type II collagen antibodies, hepatitis B and C titers and RPR, were unremarkable.  Methotrexate, high-dose oral steroids and intratympanic dexamethasone  were ineffective.  He was evaluated at [facility] where MRI of the brain and internal auditory canals without  contrast was performed on 12/17/17 which was unremarkable, including CN VII and VIII and cerebellopontine angle.  He was subsequently diagnosed with vestibular migraines.  He reports  daily headaches (at least a constant 2/10), tinnitus, and vertigo.  Headaches are mild to severe posterior neck stiffness to occipital pressure but when more severe become right frontal and sometimes bilateral pounding. He is able to still function during the migraines.  Severe headaches last a few hours.  Severe migraines occur 19-20 days a month.  Hot weather or storms triggers them.  He was referred to the vestibular clinic at [facility] where he was evaluated by Dr. Kaylie.  Abnormal vestibular exam was consistent with peripheral vestibular hypofunction affecting the left ear ([place] air caloric shows a 33% left unilateral weakness, video Head Impulse Test abnormal with repeatable corrective saccades following leftward head impulse. Vertigo is likely associated with migraines and believed to not be well-treated until migraines are controlled.   He also notes tingling in the anterior thighs occurring once a week, lasting maybe an hour.  Denies back pain, radicular pain or leg weakness.  He underwent a neuropathy workup.  TSH was 3.58.  B12 was 350.  NCV-EMG from 05/23/18 of lower extremities was normal.   Past NSAIDs:  Advil Past analgesics:  BC   Past triptan:  Sumatriptan  50mg  (constipation), rizatriptan  Past antidepressant:  Nortriptyline  25mg  (constipation), venlafaxine  Past antiepileptic:  Topiramate  50mg  twice daily (made him more nauseous), zonisamide  (constipation) Other past therapy:  Vestibular rehab (ineffective), betahistine (ineffective)  Past Medical History: Past Medical History:  Diagnosis Date   Allergy    Arthritis    Cancer (HCC)    Colon polyps    Depression    Hearing impaired person, bilateral    Hodgkin's lymphoma (HCC) 2023   nodular lypmphocyte-  s/p chemo   Low testosterone     Migraines    OSA (obstructive sleep apnea) 09/27/2019   Sleep apnea    Thymoma 2023   s/p surgery and radiation   Vestibular migraine     Medications: Outpatient Encounter Medications as  of 09/15/2023  Medication Sig Note   Cholecalciferol (VITAMIN D3) 125 MCG (5000 UT) CAPS Take 5,000 Units by mouth every other day.    levothyroxine  (SYNTHROID ) 100 MCG tablet Take 100 mcg by mouth daily.    levothyroxine  (SYNTHROID ) 75 MCG tablet Take 1 tablet (75 mcg total) by mouth daily.    Magnesium 500 MG TABS Take 500 mg by mouth daily.    Multiple Vitamins-Minerals (CENTRUM SILVER 50+MEN PO) Take 1 tablet by mouth daily with breakfast.    naproxen sodium (ALEVE) 220 MG tablet Take 220-440 mg by mouth 2 (two) times daily as needed (for headaches or migraines).    OnabotulinumtoxinA  (BOTOX  IJ) Inject as directed every 3 (three) months. 01/13/2021: Miagraines   Polyvinyl Alcohol-Povidone PF (REFRESH) 1.4-0.6 % SOLN Place 1 drop into both eyes daily as needed (dry eye).    triamcinolone  cream (KENALOG) 0.1 % Apply 1 application topically daily as needed (Eczema).    Whey Protein POWD Take 1 Scoop by mouth See admin instructions. Mix 1 scoopful into preferred beverage and drink by mouth once a day    Zinc 50 MG TABS Take 50 mg by mouth daily. (Patient not taking: Reported on 09/02/2023)    No facility-administered encounter medications on file as of 09/15/2023.    Allergies: Allergies  Allergen Reactions   Peppermint Oil Shortness Of Breath   Benzodiazepines Other (See Comments)    Part of  anesthesia cocktail per patient   Other Other (See Comments)    Unnamed anesthesia- Caused a feeling of aggression and the patient could not wake up well from it (had to be kept overnight at the hospital)    Family History: Family History  Problem Relation Age of Onset   Prostate cancer Other    Heart attack Mother 24   Heart failure Mother    Hypertension Mother    Dementia Father 13   Parkinsonism Father    Colon polyps Father    Lung cancer Sister    Melanoma Sister    Lung disease Sister    Hypertension Sister    Colon cancer Neg Hx    Stomach cancer Neg Hx    Rectal cancer Neg Hx     Esophageal cancer Neg Hx     Observations/Objective:   *** No acute distress.  Alert and oriented.  Speech fluent and not dysarthric.  Language intact.  Eyes orthophoric on primary gaze.  Face symmetric.   Follow Up Instructions:    -I discussed the assessment and treatment plan with the patient. The patient was provided an opportunity to ask questions and all were answered. The patient agreed with the plan and demonstrated an understanding of the instructions.   The patient was advised to call back or seek an in-person evaluation if the symptoms worsen or if the condition fails to improve as anticipated.   Nathaniel Bald, DO

## 2023-09-15 ENCOUNTER — Encounter: Payer: Self-pay | Admitting: Neurology

## 2023-09-15 ENCOUNTER — Telehealth (INDEPENDENT_AMBULATORY_CARE_PROVIDER_SITE_OTHER): Payer: Medicare Other | Admitting: Neurology

## 2023-09-15 DIAGNOSIS — G43709 Chronic migraine without aura, not intractable, without status migrainosus: Secondary | ICD-10-CM

## 2023-09-15 DIAGNOSIS — H8193 Unspecified disorder of vestibular function, bilateral: Secondary | ICD-10-CM | POA: Diagnosis not present

## 2023-09-15 DIAGNOSIS — G4733 Obstructive sleep apnea (adult) (pediatric): Secondary | ICD-10-CM | POA: Diagnosis not present

## 2023-09-15 DIAGNOSIS — G43809 Other migraine, not intractable, without status migrainosus: Secondary | ICD-10-CM | POA: Diagnosis not present

## 2023-09-16 ENCOUNTER — Other Ambulatory Visit: Payer: Self-pay

## 2023-09-19 ENCOUNTER — Encounter: Payer: Self-pay | Admitting: Gastroenterology

## 2023-09-19 ENCOUNTER — Ambulatory Visit: Admitting: Gastroenterology

## 2023-09-19 VITALS — BP 104/66 | HR 68 | Temp 97.8°F | Resp 15 | Ht 71.0 in | Wt 278.0 lb

## 2023-09-19 DIAGNOSIS — D123 Benign neoplasm of transverse colon: Secondary | ICD-10-CM

## 2023-09-19 DIAGNOSIS — G4733 Obstructive sleep apnea (adult) (pediatric): Secondary | ICD-10-CM | POA: Diagnosis not present

## 2023-09-19 DIAGNOSIS — K648 Other hemorrhoids: Secondary | ICD-10-CM | POA: Diagnosis not present

## 2023-09-19 DIAGNOSIS — D122 Benign neoplasm of ascending colon: Secondary | ICD-10-CM | POA: Diagnosis not present

## 2023-09-19 DIAGNOSIS — Z1211 Encounter for screening for malignant neoplasm of colon: Secondary | ICD-10-CM

## 2023-09-19 DIAGNOSIS — D125 Benign neoplasm of sigmoid colon: Secondary | ICD-10-CM

## 2023-09-19 DIAGNOSIS — Z860100 Personal history of colon polyps, unspecified: Secondary | ICD-10-CM

## 2023-09-19 DIAGNOSIS — D124 Benign neoplasm of descending colon: Secondary | ICD-10-CM | POA: Diagnosis not present

## 2023-09-19 DIAGNOSIS — F32A Depression, unspecified: Secondary | ICD-10-CM | POA: Diagnosis not present

## 2023-09-19 DIAGNOSIS — Q438 Other specified congenital malformations of intestine: Secondary | ICD-10-CM | POA: Diagnosis not present

## 2023-09-19 DIAGNOSIS — Z860101 Personal history of adenomatous and serrated colon polyps: Secondary | ICD-10-CM | POA: Diagnosis not present

## 2023-09-19 MED ORDER — SODIUM CHLORIDE 0.9 % IV SOLN: 500.0000 mL | Freq: Once | INTRAVENOUS | Status: DC

## 2023-09-19 NOTE — Progress Notes (Signed)
 Pt's states no medical or surgical changes since previsit or office visit.

## 2023-09-19 NOTE — Progress Notes (Signed)
 History and Physical:  This patient presents for endoscopic testing for: Encounter Diagnosis  Name Primary?   Hx of colonic polyps Yes    Surveillance colonoscopy today for Hx colon polyps June 2022  - 5 AubCm TA, fair prep TA in 2021, poor prep  Patient is otherwise without complaints or active issues today.   Past Medical History: Past Medical History:  Diagnosis Date   Allergy    Arthritis    Cancer (HCC)    Colon polyps    Depression    Hearing impaired person, bilateral    Hodgkin's lymphoma (HCC) 2023   nodular lypmphocyte-  s/p chemo   Low testosterone     Migraines    OSA (obstructive sleep apnea) 09/27/2019   Sleep apnea    Thymoma 2023   s/p surgery and radiation   Vestibular migraine      Past Surgical History: Past Surgical History:  Procedure Laterality Date   BICEPS TENDON REPAIR Left    excision of thymoma  2023   INNER EAR SURGERY Right    KNEE ARTHROSCOPY Left    ROTATOR CUFF REPAIR Bilateral    SUPRACLAVICAL NODE BIOPSY Right 01/30/2021   Procedure: EXCISION RIGHT SUPRACLAVICULAR LYMPH NODE;  Surgeon: Kerrin Elspeth BROCKS, MD;  Location: [facility] OR;  Service: Thoracic;  Laterality: Right;   TYMPANOSTOMY TUBE PLACEMENT     WISDOM TOOTH EXTRACTION      Allergies: Allergies  Allergen Reactions   Peppermint Oil Shortness Of Breath   Benzodiazepines Other (See Comments)    Part of anesthesia cocktail per patient   Other Other (See Comments)    Unnamed anesthesia- Caused a feeling of aggression and the patient could not wake up well from it (had to be kept overnight at the hospital)    Outpatient Meds: Current Outpatient Medications  Medication Sig Dispense Refill   Cholecalciferol (VITAMIN D3) 125 MCG (5000 UT) CAPS Take 5,000 Units by mouth every other day.     escitalopram (LEXAPRO) 10 MG tablet Take 10 mg by mouth.     levothyroxine  (SYNTHROID ) 100 MCG tablet Take 100 mcg by mouth daily.     levothyroxine  (SYNTHROID ) 75 MCG tablet Take 1  tablet (75 mcg total) by mouth daily. 90 tablet 3   Magnesium 500 MG TABS Take 500 mg by mouth daily.     Multiple Vitamins-Minerals (CENTRUM SILVER 50+MEN PO) Take 1 tablet by mouth daily with breakfast.     naproxen sodium (ALEVE) 220 MG tablet Take 220-440 mg by mouth 2 (two) times daily as needed (for headaches or migraines).     OnabotulinumtoxinA  (BOTOX  IJ) Inject as directed every 3 (three) months.     Polyvinyl Alcohol-Povidone PF (REFRESH) 1.4-0.6 % SOLN Place 1 drop into both eyes daily as needed (dry eye).     triamcinolone  cream (KENALOG) 0.1 % Apply 1 application topically daily as needed (Eczema).     Whey Protein POWD Take 1 Scoop by mouth See admin instructions. Mix 1 scoopful into preferred beverage and drink by mouth once a day     Zinc 50 MG TABS Take 50 mg by mouth daily. (Patient not taking: Reported on 09/02/2023)     Current Facility-Administered Medications  Medication Dose Route Frequency Provider Last Rate Last Admin   0.9 %  sodium chloride  infusion  500 mL Intravenous Once Danis, Onie Kasparek L III, MD          ___________________________________________________________________ Objective   Exam:  BP 115/72   Pulse 73   Temp  97.8 F (36.6 C) (Temporal)   Resp 14   Ht 5' 11 (1.803 m)   Wt 278 lb (126.1 kg)   SpO2 98%   BMI 38.77 kg/m   CV: regular , S1/S2 Resp: clear to auscultation bilaterally, normal RR and effort noted GI: soft, no tenderness, with active bowel sounds.   Assessment: Encounter Diagnosis  Name Primary?   Hx of colonic polyps Yes     Plan: Colonoscopy   The benefits and risks of the planned procedure(s) were described in detail with the patient or (when appropriate) their health care proxy.  Risks were outlined as including, but not limited to, bleeding, infection, perforation, adverse medication reaction leading to cardiac or pulmonary decompensation, pancreatitis (if ERCP).  The limitation of incomplete mucosal visualization was  also discussed.  No guarantees or warranties were given.  The patient is appropriate for an endoscopic procedure in the ambulatory setting.   - Victory Brand, MD

## 2023-09-19 NOTE — Op Note (Signed)
 [facility] Patient Name: Michael Conway Procedure Date: 09/19/2023 9:35 AM MRN: [id number] Endoscopist: Victory L. Legrand , MD, [id number] Age: 73 Referring MD:  Date of Birth: [birth year] Gender: Male Account #: [id number] Procedure:                Colonoscopy Indications:              Personal history of colonic polyps                           5 SubCm TA June 2022 (Fair prep)                           TA in 2021 (poor prep) Medicines:                Monitored Anesthesia Care Procedure:                Pre-Anesthesia Assessment:                           - Prior to the procedure, a History and Physical                            was performed, and patient medications and                            allergies were reviewed. The patient's tolerance of                            previous anesthesia was also reviewed. The risks                            and benefits of the procedure and the sedation                            options and risks were discussed with the patient.                            All questions were answered, and informed consent                            was obtained. Prior Anticoagulants: The patient has                            taken no anticoagulant or antiplatelet agents. ASA                            Grade Assessment: II - A patient with mild systemic                            disease. After reviewing the risks and benefits,                            the patient was deemed in satisfactory condition to  undergo the procedure.                           After obtaining informed consent, the colonoscope                            was passed under direct vision. Throughout the                            procedure, the patient's blood pressure, pulse, and                            oxygen saturations were monitored continuously. The                            CF [id number] #[id number] was introduced through the anus                             and advanced to the the cecum, identified by                            appendiceal orifice and ileocecal valve. The                            colonoscopy was performed with difficulty due to                            poor bowel prep, a redundant colon and the                            patient's body habitus. Successful completion of                            the procedure was aided by changing the patient to                            a semi-prone position, using manual pressure and                            straightening and shortening the scope to obtain                            bowel loop reduction. The patient tolerated the                            procedure well. The quality of the bowel                            preparation was fair in most areas despite lavage.                            The ileocecal valve, appendiceal orifice, and  rectum were photographed. The bowel preparation                            used was 2 day Suprep/Miralax . Scope In: 9:43:52 AM Scope Out: 10:11:06 AM Scope Withdrawal Time: 0 hours 19 minutes 36 seconds  Total Procedure Duration: 0 hours 27 minutes 14 seconds  Findings:                 The perianal and digital rectal examinations were                            normal.                           Repeat examination of right colon under NBI                            performed.                           Four sessile polyps were found in the sigmoid                            colon, descending colon, transverse colon and                            ascending colon. The polyps were 3 to 6 mm in size.                            These polyps were removed with a cold snare.                            Resection and retrieval were complete.                           Internal hemorrhoids were found.                           The colon (entire examined portion) was                            significantly redundant.                            The exam was otherwise without abnormality on                            direct and retroflexion views. Complications:            No immediate complications. Estimated Blood Loss:     Estimated blood loss was minimal. Impression:               - Preparation of the colon was fair.                           - Four 3 to 6 mm polyps in the sigmoid colon, in  the descending colon, in the transverse colon and                            in the ascending colon, removed with a cold snare.                            Resected and retrieved.                           - Internal hemorrhoids.                           - Redundant colon.                           - The examination was otherwise normal on direct                            and retroflexion views. Recommendation:           - Patient has a contact number available for                            emergencies. The signs and symptoms of potential                            delayed complications were discussed with the                            patient. Return to normal activities tomorrow.                            Written discharge instructions were provided to the                            patient.                           - Resume previous diet.                           - Continue present medications.                           - Await pathology results.                           - Repeat colonoscopy in 1 year for surveillance.                            Golytely  or Nulytely  prep for all future                            colonoscopies. Sharanda Shinault L. Legrand, MD 09/19/2023 10:18:34 AM This report has been signed electronically.

## 2023-09-19 NOTE — Patient Instructions (Addendum)
-   4 polyps removed and sent to pathology.  Internal hemorrhoids.  - Resume previous diet. - Continue present medications. - Await pathology results. - Repeat colonoscopy in 1 year for surveillance.    Golytely  or Nulytely  prep for all future    colonoscopies.  YOU HAD AN ENDOSCOPIC PROCEDURE TODAY AT THE [facility]:   Refer to the procedure report that was given to you for any specific questions about what was found during the examination.  If the procedure report does not answer your questions, please call your gastroenterologist to clarify.  If you requested that your care partner not be given the details of your procedure findings, then the procedure report has been included in a sealed envelope for you to review at your convenience later.  YOU SHOULD EXPECT: Some feelings of bloating in the abdomen. Passage of more gas than usual.  Walking can help get rid of the air that was put into your GI tract during the procedure and reduce the bloating. If you had a lower endoscopy (such as a colonoscopy or flexible sigmoidoscopy) you may notice spotting of blood in your stool or on the toilet paper. If you underwent a bowel prep for your procedure, you may not have a normal bowel movement for a few days.  Please Note:  You might notice some irritation and congestion in your nose or some drainage.  This is from the oxygen used during your procedure.  There is no need for concern and it should clear up in a day or so.  SYMPTOMS TO REPORT IMMEDIATELY:  Following lower endoscopy (colonoscopy or flexible sigmoidoscopy):  Excessive amounts of blood in the stool  Significant tenderness or worsening of abdominal pains  Swelling of the abdomen that is new, acute  Fever of 100F or higher   For urgent or emergent issues, a gastroenterologist can be reached at any hour by calling [phone]. Do not use MyChart messaging for urgent concerns.    DIET:  We do recommend a small meal at  first, but then you may proceed to your regular diet.  Drink plenty of fluids but you should avoid alcoholic beverages for 24 hours.  ACTIVITY:  You should plan to take it easy for the rest of today and you should NOT DRIVE or use heavy machinery until tomorrow (because of the sedation medicines used during the test).    FOLLOW UP: Our staff will call the number listed on your records the next business day following your procedure.  We will call around 7:15- 8:00 am to check on you and address any questions or concerns that you may have regarding the information given to you following your procedure. If we do not reach you, we will leave a message.     If any biopsies were taken you will be contacted by phone or by letter within the next 1-3 weeks.  Please call us  at [phone] if you have not heard about the biopsies in 3 weeks.    SIGNATURES/CONFIDENTIALITY: You and/or your care partner have signed paperwork which will be entered into your electronic medical record.  These signatures attest to the fact that that the information above on your After Visit Summary has been reviewed and is understood.  Full responsibility of the confidentiality of this discharge information lies with you and/or your care-partner.

## 2023-09-19 NOTE — Progress Notes (Signed)
 Report given to PACU, vss

## 2023-09-19 NOTE — Progress Notes (Signed)
 1003  Pt experienced laryngeal spasm with jaw thrust performed. BP 155/133, Labetalol given IV, MD update, vss

## 2023-09-20 ENCOUNTER — Telehealth: Payer: Self-pay | Admitting: *Deleted

## 2023-09-20 NOTE — Telephone Encounter (Signed)
  Follow up Call-     09/19/2023    8:52 AM  Call back number  Post procedure Call Back phone  # [phone]  Permission to leave phone message Yes    Post procedure follow up phone call. No answer at number given.  Left message on voicemail.

## 2023-09-22 ENCOUNTER — Ambulatory Visit: Payer: Self-pay | Admitting: Gastroenterology

## 2023-09-22 LAB — SURGICAL PATHOLOGY

## 2023-10-11 DIAGNOSIS — Z9089 Acquired absence of other organs: Secondary | ICD-10-CM | POA: Diagnosis not present

## 2023-10-11 DIAGNOSIS — C37 Malignant neoplasm of thymus: Secondary | ICD-10-CM | POA: Diagnosis not present

## 2023-10-11 DIAGNOSIS — Z510 Encounter for antineoplastic radiation therapy: Secondary | ICD-10-CM | POA: Diagnosis not present

## 2023-10-11 DIAGNOSIS — Z79899 Other long term (current) drug therapy: Secondary | ICD-10-CM | POA: Diagnosis not present

## 2023-10-11 DIAGNOSIS — R911 Solitary pulmonary nodule: Secondary | ICD-10-CM | POA: Diagnosis not present

## 2023-10-11 DIAGNOSIS — D4989 Neoplasm of unspecified behavior of other specified sites: Secondary | ICD-10-CM | POA: Diagnosis not present

## 2023-10-11 DIAGNOSIS — R918 Other nonspecific abnormal finding of lung field: Secondary | ICD-10-CM | POA: Diagnosis not present

## 2023-10-11 DIAGNOSIS — Z87891 Personal history of nicotine dependence: Secondary | ICD-10-CM | POA: Diagnosis not present

## 2023-10-11 DIAGNOSIS — C8100 Nodular lymphocyte predominant Hodgkin lymphoma, unspecified site: Secondary | ICD-10-CM | POA: Diagnosis not present

## 2023-10-11 DIAGNOSIS — Z888 Allergy status to other drugs, medicaments and biological substances status: Secondary | ICD-10-CM | POA: Diagnosis not present

## 2023-10-14 DIAGNOSIS — L400 Psoriasis vulgaris: Secondary | ICD-10-CM | POA: Diagnosis not present

## 2023-10-14 DIAGNOSIS — L408 Other psoriasis: Secondary | ICD-10-CM | POA: Diagnosis not present

## 2023-11-11 ENCOUNTER — Ambulatory Visit (INDEPENDENT_AMBULATORY_CARE_PROVIDER_SITE_OTHER): Admitting: Neurology

## 2023-11-11 DIAGNOSIS — G43709 Chronic migraine without aura, not intractable, without status migrainosus: Secondary | ICD-10-CM

## 2023-11-11 MED ORDER — ONABOTULINUMTOXINA 100 UNITS IJ SOLR
200.0000 [IU] | Freq: Once | INTRAMUSCULAR | Status: AC
  Administered 2023-11-11: 155 [IU] via INTRAMUSCULAR

## 2023-11-11 NOTE — Progress Notes (Signed)

## 2024-02-10 ENCOUNTER — Ambulatory Visit (INDEPENDENT_AMBULATORY_CARE_PROVIDER_SITE_OTHER): Admitting: Neurology

## 2024-02-10 DIAGNOSIS — G43709 Chronic migraine without aura, not intractable, without status migrainosus: Secondary | ICD-10-CM | POA: Diagnosis not present

## 2024-02-10 MED ORDER — ONABOTULINUMTOXINA 100 UNITS IJ SOLR
200.0000 [IU] | Freq: Once | INTRAMUSCULAR | Status: AC
  Administered 2024-02-10: 155 [IU] via INTRAMUSCULAR

## 2024-02-10 NOTE — Progress Notes (Signed)

## 2024-03-14 DIAGNOSIS — C8100 Nodular lymphocyte predominant Hodgkin lymphoma, unspecified site: Secondary | ICD-10-CM | POA: Diagnosis not present

## 2024-05-04 ENCOUNTER — Other Ambulatory Visit (HOSPITAL_COMMUNITY): Payer: Self-pay

## 2024-05-04 ENCOUNTER — Telehealth: Payer: Self-pay | Admitting: Pharmacy Technician

## 2024-05-04 NOTE — Telephone Encounter (Signed)
 Pharmacy Patient Advocate Encounter   Received notification from Physician's Office that prior authorization for BOTOX  200 is required/requested.   Insurance verification completed.   The patient is insured through [employer].   Per test claim: PA required; PA submitted to above mentioned insurance via Latent Key/confirmation #/[id number] Status is pending

## 2024-05-11 ENCOUNTER — Ambulatory Visit: Admitting: Neurology

## 2024-09-17 ENCOUNTER — Ambulatory Visit: Admitting: Neurology
